# Patient Record
Sex: Female | Born: 1945 | Race: White | Hispanic: No | Smoking: Former smoker
Health system: Southern US, Community
[De-identification: ages and names within clinical notes are randomized; demographics above are authoritative.]

## PROBLEM LIST (undated history)

## (undated) ENCOUNTER — Emergency Department (HOSPITAL_COMMUNITY)

## (undated) DIAGNOSIS — M5136 Other intervertebral disc degeneration, lumbar region: Secondary | ICD-10-CM

## (undated) DIAGNOSIS — K449 Diaphragmatic hernia without obstruction or gangrene: Secondary | ICD-10-CM

## (undated) DIAGNOSIS — Z87412 Personal history of vulvar dysplasia: Secondary | ICD-10-CM

## (undated) DIAGNOSIS — K219 Gastro-esophageal reflux disease without esophagitis: Secondary | ICD-10-CM

## (undated) DIAGNOSIS — M4306 Spondylolysis, lumbar region: Secondary | ICD-10-CM

## (undated) DIAGNOSIS — Z9889 Other specified postprocedural states: Secondary | ICD-10-CM

## (undated) DIAGNOSIS — G4733 Obstructive sleep apnea (adult) (pediatric): Secondary | ICD-10-CM

## (undated) DIAGNOSIS — G4700 Insomnia, unspecified: Secondary | ICD-10-CM

## (undated) DIAGNOSIS — R112 Nausea with vomiting, unspecified: Secondary | ICD-10-CM

## (undated) DIAGNOSIS — I10 Essential (primary) hypertension: Secondary | ICD-10-CM

## (undated) DIAGNOSIS — M51369 Other intervertebral disc degeneration, lumbar region without mention of lumbar back pain or lower extremity pain: Secondary | ICD-10-CM

## (undated) DIAGNOSIS — E785 Hyperlipidemia, unspecified: Secondary | ICD-10-CM

## (undated) DIAGNOSIS — M5126 Other intervertebral disc displacement, lumbar region: Secondary | ICD-10-CM

## (undated) HISTORY — DX: Insomnia, unspecified: G47.00

## (undated) HISTORY — DX: Other intervertebral disc degeneration, lumbar region: M51.36

## (undated) HISTORY — DX: Other intervertebral disc degeneration, lumbar region without mention of lumbar back pain or lower extremity pain: M51.369

## (undated) HISTORY — DX: Spondylolysis, lumbar region: M43.06

## (undated) HISTORY — DX: Other intervertebral disc displacement, lumbar region: M51.26

## (undated) HISTORY — PX: ABDOMINAL HYSTERECTOMY: SHX81

## (undated) HISTORY — DX: Personal history of vulvar dysplasia: Z87.412

## (undated) HISTORY — DX: Obstructive sleep apnea (adult) (pediatric): G47.33

## (undated) HISTORY — DX: Hyperlipidemia, unspecified: E78.5

---
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
## 1978-01-03 HISTORY — PX: THYROID SURGERY: SHX805

## 2003-11-18 ENCOUNTER — Ambulatory Visit: Payer: Self-pay | Admitting: Internal Medicine

## 2003-12-02 ENCOUNTER — Ambulatory Visit: Payer: Self-pay | Admitting: Internal Medicine

## 2003-12-04 ENCOUNTER — Ambulatory Visit (HOSPITAL_COMMUNITY): Admission: RE | Admit: 2003-12-04 | Discharge: 2003-12-04 | Payer: Self-pay | Admitting: Internal Medicine

## 2005-03-12 ENCOUNTER — Observation Stay: Payer: Self-pay | Admitting: Internal Medicine

## 2006-01-03 HISTORY — PX: CATARACT EXTRACTION: SUR2

## 2009-08-17 ENCOUNTER — Encounter: Admission: RE | Admit: 2009-08-17 | Discharge: 2009-08-17 | Payer: Self-pay | Admitting: Family Medicine

## 2011-01-06 DIAGNOSIS — D485 Neoplasm of uncertain behavior of skin: Secondary | ICD-10-CM | POA: Diagnosis not present

## 2011-02-10 DIAGNOSIS — N390 Urinary tract infection, site not specified: Secondary | ICD-10-CM | POA: Diagnosis not present

## 2011-02-14 DIAGNOSIS — R3915 Urgency of urination: Secondary | ICD-10-CM | POA: Diagnosis not present

## 2011-02-14 DIAGNOSIS — R142 Eructation: Secondary | ICD-10-CM | POA: Diagnosis not present

## 2011-02-14 DIAGNOSIS — R141 Gas pain: Secondary | ICD-10-CM | POA: Diagnosis not present

## 2011-02-15 DIAGNOSIS — M4712 Other spondylosis with myelopathy, cervical region: Secondary | ICD-10-CM | POA: Diagnosis not present

## 2011-02-15 DIAGNOSIS — M5030 Other cervical disc degeneration, unspecified cervical region: Secondary | ICD-10-CM | POA: Diagnosis not present

## 2011-02-15 DIAGNOSIS — M542 Cervicalgia: Secondary | ICD-10-CM | POA: Diagnosis not present

## 2011-02-15 DIAGNOSIS — M9981 Other biomechanical lesions of cervical region: Secondary | ICD-10-CM | POA: Diagnosis not present

## 2011-03-10 DIAGNOSIS — G4700 Insomnia, unspecified: Secondary | ICD-10-CM | POA: Diagnosis not present

## 2011-03-10 DIAGNOSIS — Z13220 Encounter for screening for lipoid disorders: Secondary | ICD-10-CM | POA: Diagnosis not present

## 2011-03-10 DIAGNOSIS — I10 Essential (primary) hypertension: Secondary | ICD-10-CM | POA: Diagnosis not present

## 2011-03-31 DIAGNOSIS — L255 Unspecified contact dermatitis due to plants, except food: Secondary | ICD-10-CM | POA: Diagnosis not present

## 2011-04-25 DIAGNOSIS — M5030 Other cervical disc degeneration, unspecified cervical region: Secondary | ICD-10-CM | POA: Diagnosis not present

## 2011-04-25 DIAGNOSIS — M9981 Other biomechanical lesions of cervical region: Secondary | ICD-10-CM | POA: Diagnosis not present

## 2011-04-25 DIAGNOSIS — M4712 Other spondylosis with myelopathy, cervical region: Secondary | ICD-10-CM | POA: Diagnosis not present

## 2011-04-25 DIAGNOSIS — M542 Cervicalgia: Secondary | ICD-10-CM | POA: Diagnosis not present

## 2011-07-13 ENCOUNTER — Ambulatory Visit (INDEPENDENT_AMBULATORY_CARE_PROVIDER_SITE_OTHER): Payer: Medicare Other | Admitting: Emergency Medicine

## 2011-07-13 VITALS — BP 124/70 | HR 80 | Temp 97.7°F | Resp 18 | Ht 62.0 in | Wt 171.0 lb

## 2011-07-13 DIAGNOSIS — N3000 Acute cystitis without hematuria: Secondary | ICD-10-CM | POA: Diagnosis not present

## 2011-07-13 DIAGNOSIS — R350 Frequency of micturition: Secondary | ICD-10-CM

## 2011-07-13 LAB — POCT URINALYSIS DIPSTICK
Bilirubin, UA: NEGATIVE
Glucose, UA: NEGATIVE
Ketones, UA: NEGATIVE
Nitrite, UA: NEGATIVE
Protein, UA: NEGATIVE
Urobilinogen, UA: 0.2
pH, UA: 6.5

## 2011-07-13 LAB — POCT UA - MICROSCOPIC ONLY
Casts, Ur, LPF, POC: NEGATIVE
Crystals, Ur, HPF, POC: NEGATIVE
Yeast, UA: NEGATIVE

## 2011-07-13 MED ORDER — SULFAMETHOXAZOLE-TRIMETHOPRIM 800-160 MG PO TABS: 1.0000 | ORAL_TABLET | Freq: Two times a day (BID) | ORAL | Status: DC

## 2011-07-13 MED ORDER — PHENAZOPYRIDINE HCL 200 MG PO TABS: 200.0000 mg | ORAL_TABLET | Freq: Three times a day (TID) | ORAL | Status: AC | PRN

## 2011-07-13 NOTE — Patient Instructions (Addendum)

## 2011-07-13 NOTE — Progress Notes (Signed)
  Subjective:    Patient ID: Sharon Marsh, female    DOB: [birth year], 66 y.o.   MRN: [id number]  Urinary Tract Infection  This is a new problem. The current episode started yesterday. The problem occurs every urination. The problem has been unchanged. The quality of the pain is described as aching. The pain is mild. The fever has been present for 3 - 4 days. She is not sexually active. There is no history of pyelonephritis. Associated symptoms include chills, frequency and urgency. Pertinent negatives include no discharge, flank pain, hematuria, hesitancy, nausea, possible pregnancy, sweats or vomiting. She has tried nothing for the symptoms. The treatment provided no relief. There is no history of catheterization, kidney stones, recurrent UTIs, a single kidney, urinary stasis or a urological procedure.      Review of Systems  Constitutional: Positive for chills.  HENT: Negative.   Eyes: Negative.   Respiratory: Negative.   Cardiovascular: Negative.   Gastrointestinal: Negative.  Negative for nausea and vomiting.  Genitourinary: Positive for urgency and frequency. Negative for hesitancy, hematuria and flank pain.  Musculoskeletal: Negative.        Objective:   Physical Exam  Nursing note and vitals reviewed. Constitutional: She is oriented to person, place, and time. She appears well-developed and well-nourished.  HENT:  Head: Normocephalic and atraumatic.  Right Ear: External ear normal.  Left Ear: External ear normal.  Mouth/Throat: Oropharynx is clear and moist.  Eyes: Conjunctivae are normal. Pupils are equal, round, and reactive to light.  Neck: Normal range of motion.  Cardiovascular: Normal rate, regular rhythm and normal heart sounds.   Pulmonary/Chest: Effort normal and breath sounds normal.  Abdominal: Soft.  Musculoskeletal: Normal range of motion.  Neurological: She is alert and oriented to person, place, and time.  Skin: Skin is warm and dry.            Assessment & Plan:   Results for orders placed in visit on 07/13/11  POCT URINALYSIS DIPSTICK      Component Value Range   Color, UA bright yellow     Clarity, UA clear     Glucose, UA neg     Bilirubin, UA neg     Ketones, UA neg     Spec Grav, UA <=1.005     Blood, UA large     pH, UA 6.5     Protein, UA neg     Urobilinogen, UA 0.2     Nitrite, UA neg     Leukocytes, UA large (3+)    POCT UA - MICROSCOPIC ONLY      Component Value Range   WBC, Ur, HPF, POC TNTC     RBC, urine, microscopic TNTC     Bacteria, U Microscopic 1+     Mucus, UA neg     Epithelial cells, urine per micros 0-1     Crystals, Ur, HPF, POC neg     Casts, Ur, LPF, POC neg     Yeast, UA neg

## 2011-07-15 ENCOUNTER — Telehealth: Payer: Self-pay

## 2011-07-15 NOTE — Telephone Encounter (Signed)
PT STATES THAT THE MEDICATION THAT WAS PRESCRIBED FOR HER UTI IS NOT WORKING PT WOULD LIKE TO HAVE CIPRO CALLED INTO HER PHARMACY ([facility]). BEST# [phone]

## 2011-07-16 MED ORDER — CIPROFLOXACIN HCL 500 MG PO TABS: 500.0000 mg | ORAL_TABLET | Freq: Two times a day (BID) | ORAL | Status: AC

## 2011-07-16 NOTE — Telephone Encounter (Signed)
Pt.notified

## 2011-07-16 NOTE — Telephone Encounter (Signed)
Ok to switch.  D/C Septra.  Cipro sent.

## 2011-08-10 DIAGNOSIS — M4716 Other spondylosis with myelopathy, lumbar region: Secondary | ICD-10-CM | POA: Diagnosis not present

## 2011-08-10 DIAGNOSIS — M545 Low back pain: Secondary | ICD-10-CM | POA: Diagnosis not present

## 2011-08-10 DIAGNOSIS — M999 Biomechanical lesion, unspecified: Secondary | ICD-10-CM | POA: Diagnosis not present

## 2011-08-10 DIAGNOSIS — M5137 Other intervertebral disc degeneration, lumbosacral region: Secondary | ICD-10-CM | POA: Diagnosis not present

## 2011-08-15 DIAGNOSIS — M4716 Other spondylosis with myelopathy, lumbar region: Secondary | ICD-10-CM | POA: Diagnosis not present

## 2011-08-15 DIAGNOSIS — M545 Low back pain: Secondary | ICD-10-CM | POA: Diagnosis not present

## 2011-08-15 DIAGNOSIS — M5137 Other intervertebral disc degeneration, lumbosacral region: Secondary | ICD-10-CM | POA: Diagnosis not present

## 2011-08-15 DIAGNOSIS — M999 Biomechanical lesion, unspecified: Secondary | ICD-10-CM | POA: Diagnosis not present

## 2011-08-29 DIAGNOSIS — M5137 Other intervertebral disc degeneration, lumbosacral region: Secondary | ICD-10-CM | POA: Diagnosis not present

## 2011-08-29 DIAGNOSIS — M4716 Other spondylosis with myelopathy, lumbar region: Secondary | ICD-10-CM | POA: Diagnosis not present

## 2011-08-29 DIAGNOSIS — M999 Biomechanical lesion, unspecified: Secondary | ICD-10-CM | POA: Diagnosis not present

## 2011-08-29 DIAGNOSIS — M545 Low back pain: Secondary | ICD-10-CM | POA: Diagnosis not present

## 2011-08-31 DIAGNOSIS — M999 Biomechanical lesion, unspecified: Secondary | ICD-10-CM | POA: Diagnosis not present

## 2011-08-31 DIAGNOSIS — M5137 Other intervertebral disc degeneration, lumbosacral region: Secondary | ICD-10-CM | POA: Diagnosis not present

## 2011-08-31 DIAGNOSIS — M4716 Other spondylosis with myelopathy, lumbar region: Secondary | ICD-10-CM | POA: Diagnosis not present

## 2011-08-31 DIAGNOSIS — M545 Low back pain: Secondary | ICD-10-CM | POA: Diagnosis not present

## 2011-09-07 DIAGNOSIS — M545 Low back pain: Secondary | ICD-10-CM | POA: Diagnosis not present

## 2011-09-07 DIAGNOSIS — M5137 Other intervertebral disc degeneration, lumbosacral region: Secondary | ICD-10-CM | POA: Diagnosis not present

## 2011-09-07 DIAGNOSIS — M999 Biomechanical lesion, unspecified: Secondary | ICD-10-CM | POA: Diagnosis not present

## 2011-09-07 DIAGNOSIS — M4716 Other spondylosis with myelopathy, lumbar region: Secondary | ICD-10-CM | POA: Diagnosis not present

## 2011-09-14 DIAGNOSIS — M5137 Other intervertebral disc degeneration, lumbosacral region: Secondary | ICD-10-CM | POA: Diagnosis not present

## 2011-09-14 DIAGNOSIS — M545 Low back pain: Secondary | ICD-10-CM | POA: Diagnosis not present

## 2011-09-14 DIAGNOSIS — I10 Essential (primary) hypertension: Secondary | ICD-10-CM | POA: Diagnosis not present

## 2011-09-14 DIAGNOSIS — M4716 Other spondylosis with myelopathy, lumbar region: Secondary | ICD-10-CM | POA: Diagnosis not present

## 2011-09-14 DIAGNOSIS — M999 Biomechanical lesion, unspecified: Secondary | ICD-10-CM | POA: Diagnosis not present

## 2011-09-14 DIAGNOSIS — R21 Rash and other nonspecific skin eruption: Secondary | ICD-10-CM | POA: Diagnosis not present

## 2011-09-14 DIAGNOSIS — G4700 Insomnia, unspecified: Secondary | ICD-10-CM | POA: Diagnosis not present

## 2011-09-21 DIAGNOSIS — M4716 Other spondylosis with myelopathy, lumbar region: Secondary | ICD-10-CM | POA: Diagnosis not present

## 2011-09-21 DIAGNOSIS — M999 Biomechanical lesion, unspecified: Secondary | ICD-10-CM | POA: Diagnosis not present

## 2011-09-21 DIAGNOSIS — M545 Low back pain: Secondary | ICD-10-CM | POA: Diagnosis not present

## 2011-09-21 DIAGNOSIS — M5137 Other intervertebral disc degeneration, lumbosacral region: Secondary | ICD-10-CM | POA: Diagnosis not present

## 2011-09-29 DIAGNOSIS — M5137 Other intervertebral disc degeneration, lumbosacral region: Secondary | ICD-10-CM | POA: Diagnosis not present

## 2011-09-29 DIAGNOSIS — M999 Biomechanical lesion, unspecified: Secondary | ICD-10-CM | POA: Diagnosis not present

## 2011-09-29 DIAGNOSIS — M4716 Other spondylosis with myelopathy, lumbar region: Secondary | ICD-10-CM | POA: Diagnosis not present

## 2011-09-29 DIAGNOSIS — M545 Low back pain: Secondary | ICD-10-CM | POA: Diagnosis not present

## 2011-10-31 DIAGNOSIS — M4716 Other spondylosis with myelopathy, lumbar region: Secondary | ICD-10-CM | POA: Diagnosis not present

## 2011-10-31 DIAGNOSIS — M545 Low back pain: Secondary | ICD-10-CM | POA: Diagnosis not present

## 2011-10-31 DIAGNOSIS — M999 Biomechanical lesion, unspecified: Secondary | ICD-10-CM | POA: Diagnosis not present

## 2011-10-31 DIAGNOSIS — M5137 Other intervertebral disc degeneration, lumbosacral region: Secondary | ICD-10-CM | POA: Diagnosis not present

## 2011-11-07 DIAGNOSIS — M999 Biomechanical lesion, unspecified: Secondary | ICD-10-CM | POA: Diagnosis not present

## 2011-11-07 DIAGNOSIS — M4716 Other spondylosis with myelopathy, lumbar region: Secondary | ICD-10-CM | POA: Diagnosis not present

## 2011-11-07 DIAGNOSIS — M5137 Other intervertebral disc degeneration, lumbosacral region: Secondary | ICD-10-CM | POA: Diagnosis not present

## 2011-11-07 DIAGNOSIS — M545 Low back pain: Secondary | ICD-10-CM | POA: Diagnosis not present

## 2011-11-09 DIAGNOSIS — M5137 Other intervertebral disc degeneration, lumbosacral region: Secondary | ICD-10-CM | POA: Diagnosis not present

## 2011-11-09 DIAGNOSIS — M999 Biomechanical lesion, unspecified: Secondary | ICD-10-CM | POA: Diagnosis not present

## 2011-11-09 DIAGNOSIS — M4716 Other spondylosis with myelopathy, lumbar region: Secondary | ICD-10-CM | POA: Diagnosis not present

## 2011-11-09 DIAGNOSIS — M545 Low back pain: Secondary | ICD-10-CM | POA: Diagnosis not present

## 2011-11-15 DIAGNOSIS — M4716 Other spondylosis with myelopathy, lumbar region: Secondary | ICD-10-CM | POA: Diagnosis not present

## 2011-11-15 DIAGNOSIS — M545 Low back pain: Secondary | ICD-10-CM | POA: Diagnosis not present

## 2011-11-15 DIAGNOSIS — M999 Biomechanical lesion, unspecified: Secondary | ICD-10-CM | POA: Diagnosis not present

## 2011-11-15 DIAGNOSIS — M5137 Other intervertebral disc degeneration, lumbosacral region: Secondary | ICD-10-CM | POA: Diagnosis not present

## 2011-11-25 DIAGNOSIS — M5137 Other intervertebral disc degeneration, lumbosacral region: Secondary | ICD-10-CM | POA: Diagnosis not present

## 2011-11-25 DIAGNOSIS — M545 Low back pain: Secondary | ICD-10-CM | POA: Diagnosis not present

## 2011-11-25 DIAGNOSIS — M999 Biomechanical lesion, unspecified: Secondary | ICD-10-CM | POA: Diagnosis not present

## 2011-11-25 DIAGNOSIS — M4716 Other spondylosis with myelopathy, lumbar region: Secondary | ICD-10-CM | POA: Diagnosis not present

## 2011-12-08 DIAGNOSIS — M999 Biomechanical lesion, unspecified: Secondary | ICD-10-CM | POA: Diagnosis not present

## 2011-12-08 DIAGNOSIS — M545 Low back pain: Secondary | ICD-10-CM | POA: Diagnosis not present

## 2011-12-08 DIAGNOSIS — M4716 Other spondylosis with myelopathy, lumbar region: Secondary | ICD-10-CM | POA: Diagnosis not present

## 2011-12-08 DIAGNOSIS — M5137 Other intervertebral disc degeneration, lumbosacral region: Secondary | ICD-10-CM | POA: Diagnosis not present

## 2011-12-09 DIAGNOSIS — M999 Biomechanical lesion, unspecified: Secondary | ICD-10-CM | POA: Diagnosis not present

## 2011-12-09 DIAGNOSIS — M545 Low back pain: Secondary | ICD-10-CM | POA: Diagnosis not present

## 2011-12-09 DIAGNOSIS — M5137 Other intervertebral disc degeneration, lumbosacral region: Secondary | ICD-10-CM | POA: Diagnosis not present

## 2011-12-09 DIAGNOSIS — M4716 Other spondylosis with myelopathy, lumbar region: Secondary | ICD-10-CM | POA: Diagnosis not present

## 2011-12-16 DIAGNOSIS — M4716 Other spondylosis with myelopathy, lumbar region: Secondary | ICD-10-CM | POA: Diagnosis not present

## 2011-12-16 DIAGNOSIS — M5137 Other intervertebral disc degeneration, lumbosacral region: Secondary | ICD-10-CM | POA: Diagnosis not present

## 2011-12-16 DIAGNOSIS — M545 Low back pain: Secondary | ICD-10-CM | POA: Diagnosis not present

## 2011-12-16 DIAGNOSIS — M999 Biomechanical lesion, unspecified: Secondary | ICD-10-CM | POA: Diagnosis not present

## 2011-12-20 DIAGNOSIS — M999 Biomechanical lesion, unspecified: Secondary | ICD-10-CM | POA: Diagnosis not present

## 2011-12-20 DIAGNOSIS — M5137 Other intervertebral disc degeneration, lumbosacral region: Secondary | ICD-10-CM | POA: Diagnosis not present

## 2011-12-20 DIAGNOSIS — M545 Low back pain: Secondary | ICD-10-CM | POA: Diagnosis not present

## 2011-12-20 DIAGNOSIS — M4716 Other spondylosis with myelopathy, lumbar region: Secondary | ICD-10-CM | POA: Diagnosis not present

## 2011-12-23 DIAGNOSIS — L820 Inflamed seborrheic keratosis: Secondary | ICD-10-CM | POA: Diagnosis not present

## 2012-01-06 DIAGNOSIS — M5137 Other intervertebral disc degeneration, lumbosacral region: Secondary | ICD-10-CM | POA: Diagnosis not present

## 2012-01-06 DIAGNOSIS — M999 Biomechanical lesion, unspecified: Secondary | ICD-10-CM | POA: Diagnosis not present

## 2012-01-06 DIAGNOSIS — M4716 Other spondylosis with myelopathy, lumbar region: Secondary | ICD-10-CM | POA: Diagnosis not present

## 2012-01-06 DIAGNOSIS — M545 Low back pain: Secondary | ICD-10-CM | POA: Diagnosis not present

## 2012-02-03 DIAGNOSIS — M999 Biomechanical lesion, unspecified: Secondary | ICD-10-CM | POA: Diagnosis not present

## 2012-02-03 DIAGNOSIS — M5137 Other intervertebral disc degeneration, lumbosacral region: Secondary | ICD-10-CM | POA: Diagnosis not present

## 2012-02-03 DIAGNOSIS — M545 Low back pain: Secondary | ICD-10-CM | POA: Diagnosis not present

## 2012-02-03 DIAGNOSIS — M4716 Other spondylosis with myelopathy, lumbar region: Secondary | ICD-10-CM | POA: Diagnosis not present

## 2012-02-08 DIAGNOSIS — M5137 Other intervertebral disc degeneration, lumbosacral region: Secondary | ICD-10-CM | POA: Diagnosis not present

## 2012-02-08 DIAGNOSIS — M4716 Other spondylosis with myelopathy, lumbar region: Secondary | ICD-10-CM | POA: Diagnosis not present

## 2012-02-08 DIAGNOSIS — M545 Low back pain: Secondary | ICD-10-CM | POA: Diagnosis not present

## 2012-02-08 DIAGNOSIS — M999 Biomechanical lesion, unspecified: Secondary | ICD-10-CM | POA: Diagnosis not present

## 2012-02-20 DIAGNOSIS — M999 Biomechanical lesion, unspecified: Secondary | ICD-10-CM | POA: Diagnosis not present

## 2012-02-20 DIAGNOSIS — M4716 Other spondylosis with myelopathy, lumbar region: Secondary | ICD-10-CM | POA: Diagnosis not present

## 2012-02-20 DIAGNOSIS — M545 Low back pain: Secondary | ICD-10-CM | POA: Diagnosis not present

## 2012-02-20 DIAGNOSIS — M5137 Other intervertebral disc degeneration, lumbosacral region: Secondary | ICD-10-CM | POA: Diagnosis not present

## 2012-03-02 DIAGNOSIS — M545 Low back pain: Secondary | ICD-10-CM | POA: Diagnosis not present

## 2012-03-02 DIAGNOSIS — M4716 Other spondylosis with myelopathy, lumbar region: Secondary | ICD-10-CM | POA: Diagnosis not present

## 2012-03-02 DIAGNOSIS — M999 Biomechanical lesion, unspecified: Secondary | ICD-10-CM | POA: Diagnosis not present

## 2012-03-15 ENCOUNTER — Other Ambulatory Visit (HOSPITAL_COMMUNITY): Payer: Self-pay | Admitting: Family Medicine

## 2012-03-15 DIAGNOSIS — E7800 Pure hypercholesterolemia, unspecified: Secondary | ICD-10-CM | POA: Diagnosis not present

## 2012-03-15 DIAGNOSIS — N9489 Other specified conditions associated with female genital organs and menstrual cycle: Secondary | ICD-10-CM | POA: Diagnosis not present

## 2012-03-15 DIAGNOSIS — I10 Essential (primary) hypertension: Secondary | ICD-10-CM | POA: Diagnosis not present

## 2012-03-15 DIAGNOSIS — Z1231 Encounter for screening mammogram for malignant neoplasm of breast: Secondary | ICD-10-CM

## 2012-03-15 DIAGNOSIS — Z6831 Body mass index (BMI) 31.0-31.9, adult: Secondary | ICD-10-CM | POA: Diagnosis not present

## 2012-03-21 DIAGNOSIS — M545 Low back pain: Secondary | ICD-10-CM | POA: Diagnosis not present

## 2012-03-21 DIAGNOSIS — M5137 Other intervertebral disc degeneration, lumbosacral region: Secondary | ICD-10-CM | POA: Diagnosis not present

## 2012-03-21 DIAGNOSIS — M4716 Other spondylosis with myelopathy, lumbar region: Secondary | ICD-10-CM | POA: Diagnosis not present

## 2012-03-21 DIAGNOSIS — M999 Biomechanical lesion, unspecified: Secondary | ICD-10-CM | POA: Diagnosis not present

## 2012-03-22 ENCOUNTER — Ambulatory Visit (HOSPITAL_COMMUNITY): Payer: Medicare Other

## 2012-03-26 ENCOUNTER — Ambulatory Visit (HOSPITAL_COMMUNITY)
Admission: RE | Admit: 2012-03-26 | Discharge: 2012-03-26 | Disposition: A | Discharge status: Home / Self Care | Payer: Medicare Other | Source: Ambulatory Visit | Attending: Family Medicine | Admitting: Family Medicine

## 2012-03-26 DIAGNOSIS — Z1231 Encounter for screening mammogram for malignant neoplasm of breast: Secondary | ICD-10-CM | POA: Insufficient documentation

## 2012-03-29 DIAGNOSIS — M5137 Other intervertebral disc degeneration, lumbosacral region: Secondary | ICD-10-CM | POA: Diagnosis not present

## 2012-03-29 DIAGNOSIS — M4716 Other spondylosis with myelopathy, lumbar region: Secondary | ICD-10-CM | POA: Diagnosis not present

## 2012-03-29 DIAGNOSIS — M999 Biomechanical lesion, unspecified: Secondary | ICD-10-CM | POA: Diagnosis not present

## 2012-03-29 DIAGNOSIS — M545 Low back pain: Secondary | ICD-10-CM | POA: Diagnosis not present

## 2012-04-02 DIAGNOSIS — M999 Biomechanical lesion, unspecified: Secondary | ICD-10-CM | POA: Diagnosis not present

## 2012-04-02 DIAGNOSIS — M5137 Other intervertebral disc degeneration, lumbosacral region: Secondary | ICD-10-CM | POA: Diagnosis not present

## 2012-04-02 DIAGNOSIS — M545 Low back pain: Secondary | ICD-10-CM | POA: Diagnosis not present

## 2012-04-02 DIAGNOSIS — M4716 Other spondylosis with myelopathy, lumbar region: Secondary | ICD-10-CM | POA: Diagnosis not present

## 2012-04-17 DIAGNOSIS — M4716 Other spondylosis with myelopathy, lumbar region: Secondary | ICD-10-CM | POA: Diagnosis not present

## 2012-04-17 DIAGNOSIS — Z01419 Encounter for gynecological examination (general) (routine) without abnormal findings: Secondary | ICD-10-CM | POA: Diagnosis not present

## 2012-04-17 DIAGNOSIS — M545 Low back pain: Secondary | ICD-10-CM | POA: Diagnosis not present

## 2012-04-17 DIAGNOSIS — M999 Biomechanical lesion, unspecified: Secondary | ICD-10-CM | POA: Diagnosis not present

## 2012-04-17 DIAGNOSIS — M5137 Other intervertebral disc degeneration, lumbosacral region: Secondary | ICD-10-CM | POA: Diagnosis not present

## 2012-04-17 DIAGNOSIS — Z124 Encounter for screening for malignant neoplasm of cervix: Secondary | ICD-10-CM | POA: Diagnosis not present

## 2012-05-04 DIAGNOSIS — M4716 Other spondylosis with myelopathy, lumbar region: Secondary | ICD-10-CM | POA: Diagnosis not present

## 2012-05-04 DIAGNOSIS — M5137 Other intervertebral disc degeneration, lumbosacral region: Secondary | ICD-10-CM | POA: Diagnosis not present

## 2012-05-04 DIAGNOSIS — M545 Low back pain: Secondary | ICD-10-CM | POA: Diagnosis not present

## 2012-05-04 DIAGNOSIS — M999 Biomechanical lesion, unspecified: Secondary | ICD-10-CM | POA: Diagnosis not present

## 2012-05-09 DIAGNOSIS — D4959 Neoplasm of unspecified behavior of other genitourinary organ: Secondary | ICD-10-CM | POA: Diagnosis not present

## 2012-05-09 DIAGNOSIS — N901 Moderate vulvar dysplasia: Secondary | ICD-10-CM | POA: Diagnosis not present

## 2012-05-09 DIAGNOSIS — Z113 Encounter for screening for infections with a predominantly sexual mode of transmission: Secondary | ICD-10-CM | POA: Diagnosis not present

## 2012-05-17 DIAGNOSIS — M999 Biomechanical lesion, unspecified: Secondary | ICD-10-CM | POA: Diagnosis not present

## 2012-05-17 DIAGNOSIS — M5137 Other intervertebral disc degeneration, lumbosacral region: Secondary | ICD-10-CM | POA: Diagnosis not present

## 2012-05-17 DIAGNOSIS — M545 Low back pain: Secondary | ICD-10-CM | POA: Diagnosis not present

## 2012-05-17 DIAGNOSIS — M4716 Other spondylosis with myelopathy, lumbar region: Secondary | ICD-10-CM | POA: Diagnosis not present

## 2012-05-30 DIAGNOSIS — D071 Carcinoma in situ of vulva: Secondary | ICD-10-CM | POA: Diagnosis not present

## 2012-06-04 DIAGNOSIS — N9089 Other specified noninflammatory disorders of vulva and perineum: Secondary | ICD-10-CM | POA: Diagnosis not present

## 2012-06-05 DIAGNOSIS — M5137 Other intervertebral disc degeneration, lumbosacral region: Secondary | ICD-10-CM | POA: Diagnosis not present

## 2012-06-05 DIAGNOSIS — M545 Low back pain: Secondary | ICD-10-CM | POA: Diagnosis not present

## 2012-06-05 DIAGNOSIS — M999 Biomechanical lesion, unspecified: Secondary | ICD-10-CM | POA: Diagnosis not present

## 2012-06-05 DIAGNOSIS — M4716 Other spondylosis with myelopathy, lumbar region: Secondary | ICD-10-CM | POA: Diagnosis not present

## 2012-06-08 DIAGNOSIS — M545 Low back pain: Secondary | ICD-10-CM | POA: Diagnosis not present

## 2012-06-08 DIAGNOSIS — M999 Biomechanical lesion, unspecified: Secondary | ICD-10-CM | POA: Diagnosis not present

## 2012-06-08 DIAGNOSIS — M5137 Other intervertebral disc degeneration, lumbosacral region: Secondary | ICD-10-CM | POA: Diagnosis not present

## 2012-06-08 DIAGNOSIS — M4716 Other spondylosis with myelopathy, lumbar region: Secondary | ICD-10-CM | POA: Diagnosis not present

## 2012-06-18 ENCOUNTER — Encounter (HOSPITAL_COMMUNITY): Payer: Self-pay | Admitting: Pharmacist

## 2012-06-20 DIAGNOSIS — M545 Low back pain: Secondary | ICD-10-CM | POA: Diagnosis not present

## 2012-06-20 DIAGNOSIS — M4716 Other spondylosis with myelopathy, lumbar region: Secondary | ICD-10-CM | POA: Diagnosis not present

## 2012-06-20 DIAGNOSIS — M999 Biomechanical lesion, unspecified: Secondary | ICD-10-CM | POA: Diagnosis not present

## 2012-06-20 DIAGNOSIS — M5137 Other intervertebral disc degeneration, lumbosacral region: Secondary | ICD-10-CM | POA: Diagnosis not present

## 2012-06-21 ENCOUNTER — Other Ambulatory Visit: Payer: Self-pay

## 2012-06-21 ENCOUNTER — Encounter (HOSPITAL_COMMUNITY)
Admission: RE | Admit: 2012-06-21 | Discharge: 2012-06-21 | Disposition: A | Discharge status: Home / Self Care | Payer: Medicare Other | Source: Ambulatory Visit | Attending: Obstetrics and Gynecology | Admitting: Obstetrics and Gynecology

## 2012-06-21 ENCOUNTER — Encounter (HOSPITAL_COMMUNITY): Payer: Self-pay

## 2012-06-21 ENCOUNTER — Other Ambulatory Visit: Payer: Self-pay | Admitting: Obstetrics and Gynecology

## 2012-06-21 DIAGNOSIS — D071 Carcinoma in situ of vulva: Secondary | ICD-10-CM | POA: Diagnosis not present

## 2012-06-21 DIAGNOSIS — G4730 Sleep apnea, unspecified: Secondary | ICD-10-CM | POA: Diagnosis not present

## 2012-06-21 DIAGNOSIS — I10 Essential (primary) hypertension: Secondary | ICD-10-CM | POA: Diagnosis not present

## 2012-06-21 HISTORY — DX: Other specified postprocedural states: Z98.890

## 2012-06-21 HISTORY — DX: Gastro-esophageal reflux disease without esophagitis: K21.9

## 2012-06-21 HISTORY — DX: Essential (primary) hypertension: I10

## 2012-06-21 HISTORY — DX: Nausea with vomiting, unspecified: R11.2

## 2012-06-21 LAB — CBC
HCT: 40.4 % (ref 36.0–46.0)
Hemoglobin: 13.6 g/dL (ref 12.0–15.0)
MCH: 28 pg (ref 26.0–34.0)
MCHC: 33.7 g/dL (ref 30.0–36.0)
MCV: 83.3 fL (ref 78.0–100.0)
Platelets: 266 10*3/uL (ref 150–400)
RBC: 4.85 MIL/uL (ref 3.87–5.11)
RDW: 13.9 % (ref 11.5–15.5)
WBC: 5.6 10*3/uL (ref 4.0–10.5)

## 2012-06-21 LAB — BASIC METABOLIC PANEL
BUN: 15 mg/dL (ref 6–23)
CO2: 28 mEq/L (ref 19–32)
Calcium: 9.5 mg/dL (ref 8.4–10.5)
Chloride: 100 mEq/L (ref 96–112)
Creatinine, Ser: 0.76 mg/dL (ref 0.50–1.10)
GFR calc Af Amer: >90 mL/min (ref >90)
GFR calc non Af Amer: 85 mL/min — ABNORMAL LOW (ref >90)
Glucose, Bld: 92 mg/dL (ref 70–99)
Potassium: 4.2 mEq/L (ref 3.5–5.1)
Sodium: 138 mEq/L (ref 135–145)

## 2012-06-21 NOTE — Patient Instructions (Addendum)
20 AV[address]AU[address]  06/21/2012   Your procedure is scheduled on:  06/22/12  Enter through the Main Entrance of [facility] at 6 AM.  Pick up the phone at the desk and dial [phone].   Call this number if you have problems the morning of surgery: [phone]   Remember:   Do not eat food:After Midnight.  Do not drink clear liquids: After Midnight.  Take these medicines the morning of surgery with A SIP OF WATER: Blood pressure, Prilosec   Do not wear jewelry, make-up or nail polish.  Do not wear lotions, powders, or perfumes. You may wear deodorant.  Do not shave 48 hours prior to surgery.  Do not bring valuables to the hospital.  [facility] is not responsible                  for any belongings or valuables brought to the hospital.  Contacts, dentures or bridgework may not be worn into surgery.  Leave suitcase in the car. After surgery it may be brought to your room.  For patients admitted to the hospital, checkout time is 11:00 AM the day of                discharge.   Patients discharged the day of surgery will not be allowed to drive                   home.  Name and phone number of your driver: daughter  Special Instructions: Shower using CHG 2 nights before surgery and the night before surgery.  If you shower the day of surgery use CHG.  Use special wash - you have one bottle of CHG for all showers.  You should use approximately 1/3 of the bottle for each shower.   Please read over the following fact sheets that you were given: Surgical Site Infection Prevention

## 2012-06-21 NOTE — H&P (Addendum)
67 y.o. complains of irritated area at posterior R vulva for several months.  On exam the area appeared to be VIN vs. Vulvar cancer.  Biopsy confirmed high grade VIN. Colpo performed to visualize extent of affected area.  Pt was scheduled for wide local excision of the affected area.  Normal pap 2010, repap of vaginal cuff 04/23/2012 neg and HPV neg.  Past Medical History  Diagnosis Date  . PONV (postoperative nausea and vomiting)   . Hypertension   . GERD (gastroesophageal reflux disease)    Past Surgical History  Procedure Laterality Date  . Appendectomy    . Abdominal hysterectomy      History   Social History  . Marital Status: Widowed    Spouse Name: N/A    Number of Children: N/A  . Years of Education: N/A   Occupational History  . Not on file.   Social History Main Topics  . Smoking status: Former Games developer  . Smokeless tobacco: Not on file  . Alcohol Use: Yes     Comment: socially  . Drug Use: No  . Sexually Active: Not on file   Other Topics Concern  . Not on file   Social History Narrative  . No narrative on file    No current facility-administered medications on file prior to encounter.   Current Outpatient Prescriptions on File Prior to Encounter  Medication Sig Dispense Refill  . omeprazole (PRILOSEC) 20 MG capsule Take 20 mg by mouth daily.        Allergies  Allergen Reactions  . Clindamycin/Lincomycin Hypertension    @VITALS2 @  Lungs: clear to ascultation Cor:  RRR Abdomen:  soft, nontender, nondistended. Ex:  no cords, erythema Pelvic:  Def to OR  A:  High grade VIN   P:  Wide local excision.   All risks, benefits and alternatives d/w patient and she desires to proceed with procedure .      Nephi Savage  No changes to history of physical.  Will proceed as planned. Consent reaffirmed, alternatives discussed, risks, benefits, potential for recurrence and increased frequency of monitoring reviewed.

## 2012-06-22 ENCOUNTER — Encounter (HOSPITAL_COMMUNITY): Payer: Self-pay | Admitting: Anesthesiology

## 2012-06-22 ENCOUNTER — Encounter (HOSPITAL_COMMUNITY)
Admission: RE | Disposition: A | Discharge status: Home / Self Care | Payer: Self-pay | Source: Ambulatory Visit | Attending: Obstetrics and Gynecology

## 2012-06-22 ENCOUNTER — Ambulatory Visit (HOSPITAL_COMMUNITY): Payer: Medicare Other | Admitting: Anesthesiology

## 2012-06-22 ENCOUNTER — Ambulatory Visit (HOSPITAL_COMMUNITY)
Admission: RE | Admit: 2012-06-22 | Discharge: 2012-06-22 | Disposition: A | Discharge status: Home / Self Care | Payer: Medicare Other | Source: Ambulatory Visit | Attending: Obstetrics and Gynecology | Admitting: Obstetrics and Gynecology

## 2012-06-22 DIAGNOSIS — N901 Moderate vulvar dysplasia: Secondary | ICD-10-CM | POA: Diagnosis not present

## 2012-06-22 DIAGNOSIS — G4730 Sleep apnea, unspecified: Secondary | ICD-10-CM | POA: Insufficient documentation

## 2012-06-22 DIAGNOSIS — I10 Essential (primary) hypertension: Secondary | ICD-10-CM | POA: Insufficient documentation

## 2012-06-22 DIAGNOSIS — D071 Carcinoma in situ of vulva: Secondary | ICD-10-CM | POA: Diagnosis not present

## 2012-06-22 HISTORY — PX: VULVECTOMY: SHX1086

## 2012-06-22 HISTORY — PX: OTHER SURGICAL HISTORY: SHX169

## 2012-06-22 SURGERY — WIDE EXCISION VULVECTOMY
Anesthesia: General | Site: Vagina | Wound class: Clean Contaminated

## 2012-06-22 MED ORDER — ACETAMINOPHEN 160 MG/5ML PO SOLN
ORAL | Status: AC
  Administered 2012-06-22: 975 mg via ORAL
  Filled 2012-06-22: qty 40.6

## 2012-06-22 MED ORDER — CHLOROPROCAINE HCL 1 % IJ SOLN
INTRAMUSCULAR | Status: AC
  Filled 2012-06-22: qty 30

## 2012-06-22 MED ORDER — DEXAMETHASONE SODIUM PHOSPHATE 10 MG/ML IJ SOLN
INTRAMUSCULAR | Status: AC
Start: 2012-06-22 — End: 2012-06-22
  Filled 2012-06-22: qty 1

## 2012-06-22 MED ORDER — FENTANYL CITRATE 0.05 MG/ML IJ SOLN
INTRAMUSCULAR | Status: DC | PRN
  Administered 2012-06-22: 100 ug via INTRAVENOUS

## 2012-06-22 MED ORDER — ONDANSETRON HCL 4 MG/2ML IJ SOLN
INTRAMUSCULAR | Status: DC | PRN
  Administered 2012-06-22: 4 mg via INTRAVENOUS

## 2012-06-22 MED ORDER — KETOROLAC TROMETHAMINE 30 MG/ML IJ SOLN
INTRAMUSCULAR | Status: AC
  Filled 2012-06-22: qty 1

## 2012-06-22 MED ORDER — EPHEDRINE SULFATE 50 MG/ML IJ SOLN
INTRAMUSCULAR | Status: DC | PRN
  Administered 2012-06-22 (×4): 10 mg via INTRAVENOUS

## 2012-06-22 MED ORDER — FENTANYL CITRATE 0.05 MG/ML IJ SOLN: 25.0000 ug | INTRAMUSCULAR | Status: DC | PRN

## 2012-06-22 MED ORDER — KETOROLAC TROMETHAMINE 30 MG/ML IJ SOLN
INTRAMUSCULAR | Status: DC | PRN
  Administered 2012-06-22: 30 mg via INTRAVENOUS

## 2012-06-22 MED ORDER — ACETAMINOPHEN 160 MG/5ML PO SOLN
975.0000 mg | Freq: Once | ORAL | Status: AC
  Administered 2012-06-22: 975 mg via ORAL

## 2012-06-22 MED ORDER — LACTATED RINGERS IV SOLN
INTRAVENOUS | Status: DC
  Administered 2012-06-22 (×2): via INTRAVENOUS

## 2012-06-22 MED ORDER — PROPOFOL 10 MG/ML IV EMUL
INTRAVENOUS | Status: AC
  Filled 2012-06-22: qty 20

## 2012-06-22 MED ORDER — EPHEDRINE 5 MG/ML INJ
INTRAVENOUS | Status: AC
Start: 2012-06-22 — End: 2012-06-22
  Filled 2012-06-22: qty 10

## 2012-06-22 MED ORDER — ONDANSETRON HCL 4 MG/2ML IJ SOLN
INTRAMUSCULAR | Status: AC
  Filled 2012-06-22: qty 2

## 2012-06-22 MED ORDER — FENTANYL CITRATE 0.05 MG/ML IJ SOLN
INTRAMUSCULAR | Status: AC
  Filled 2012-06-22: qty 5

## 2012-06-22 MED ORDER — ONDANSETRON HCL 4 MG/2ML IJ SOLN: INTRAMUSCULAR | Status: DC | PRN

## 2012-06-22 MED ORDER — LIDOCAINE HCL (CARDIAC) 20 MG/ML IV SOLN
INTRAVENOUS | Status: AC
Start: 2012-06-22 — End: 2012-06-22
  Filled 2012-06-22: qty 5

## 2012-06-22 MED ORDER — PROPOFOL 10 MG/ML IV BOLUS
INTRAVENOUS | Status: DC | PRN
  Administered 2012-06-22: 180 mg via INTRAVENOUS
  Administered 2012-06-22: 20 mg via INTRAVENOUS

## 2012-06-22 MED ORDER — ACETIC ACID 4% SOLUTION
Status: DC | PRN
  Administered 2012-06-22: 1 via TOPICAL

## 2012-06-22 MED ORDER — SCOPOLAMINE 1 MG/3DAYS TD PT72
1.0000 | MEDICATED_PATCH | Freq: Once | TRANSDERMAL | Status: DC | PRN
  Administered 2012-06-22: 1.5 mg via TRANSDERMAL

## 2012-06-22 MED ORDER — LIDOCAINE HCL (CARDIAC) 20 MG/ML IV SOLN
INTRAVENOUS | Status: DC | PRN
  Administered 2012-06-22: 50 mg via INTRAVENOUS

## 2012-06-22 MED ORDER — FENTANYL CITRATE 0.05 MG/ML IJ SOLN
INTRAMUSCULAR | Status: AC
  Administered 2012-06-22: 50 ug via INTRAVENOUS
  Filled 2012-06-22: qty 2

## 2012-06-22 MED ORDER — OXYCODONE-ACETAMINOPHEN 5-325 MG PO TABS: 1.0000 | ORAL_TABLET | ORAL | Status: DC | PRN

## 2012-06-22 MED ORDER — DEXAMETHASONE SODIUM PHOSPHATE 10 MG/ML IJ SOLN
INTRAMUSCULAR | Status: DC | PRN
  Administered 2012-06-22: 8 mg via INTRAVENOUS

## 2012-06-22 MED ORDER — VASOPRESSIN 20 UNIT/ML IJ SOLN
INTRAMUSCULAR | Status: AC
  Filled 2012-06-22: qty 1

## 2012-06-22 MED ORDER — LIDOCAINE HCL 1 % IJ SOLN
INTRAMUSCULAR | Status: DC | PRN
  Administered 2012-06-22: 6 mL

## 2012-06-22 SURGICAL SUPPLY — 47 items
APPLICATOR COTTON TIP 6IN STRL (MISCELLANEOUS) ×3 IMPLANT
BLADE SURG 11 STRL SS (BLADE) ×2 IMPLANT
BLADE SURG 15 STRL LF C SS BP (BLADE) ×2 IMPLANT
BLADE SURG 15 STRL SS (BLADE) ×3
CATH SILICONE 16FRX5CC (CATHETERS) ×4 IMPLANT
CLOTH BEACON ORANGE TIMEOUT ST (SAFETY) ×3 IMPLANT
CONTAINER PREFILL 10% NBF 15ML (MISCELLANEOUS) IMPLANT
CONTAINER PREFILL 10% NBF 60ML (FORM) IMPLANT
COUNTER NEEDLE 1200 MAGNETIC (NEEDLE) IMPLANT
DECANTER SPIKE VIAL GLASS SM (MISCELLANEOUS) ×2 IMPLANT
DEPRESSOR TONGUE BLADE WOOD (MISCELLANEOUS) ×3 IMPLANT
DRESSING TELFA 8X3 (GAUZE/BANDAGES/DRESSINGS) ×3 IMPLANT
ELECT NDL TIP 2.8 STRL (NEEDLE) IMPLANT
ELECT NEEDLE TIP 2.8 STRL (NEEDLE) ×3 IMPLANT
ELECT REM PT RETURN 9FT ADLT (ELECTROSURGICAL)
ELECTRODE REM PT RTRN 9FT ADLT (ELECTROSURGICAL) IMPLANT
EVACUATOR PREFILTER SMOKE (MISCELLANEOUS) ×3 IMPLANT
GAUZE SPONGE 4X4 16PLY XRAY LF (GAUZE/BANDAGES/DRESSINGS) IMPLANT
GLOVE BIO SURGEON STRL SZ 6.5 (GLOVE) ×3 IMPLANT
GLOVE BIOGEL PI IND STRL 6.5 (GLOVE) ×4 IMPLANT
GLOVE BIOGEL PI INDICATOR 6.5 (GLOVE) ×2
GOWN PREVENTION PLUS LG XLONG (DISPOSABLE) ×6 IMPLANT
GOWN STRL REIN XL XLG (GOWN DISPOSABLE) ×6 IMPLANT
HOSE NS SMOKE EVAC 7/8 X6 (MISCELLANEOUS) ×3 IMPLANT
NDL SPNL 22GX3.5 QUINCKE BK (NEEDLE) ×1 IMPLANT
NEEDLE HYPO 25X1 1.5 SAFETY (NEEDLE) ×3 IMPLANT
NEEDLE SPNL 22GX3.5 QUINCKE BK (NEEDLE) ×3 IMPLANT
NS IRRIG 1000ML POUR BTL (IV SOLUTION) IMPLANT
PACK VAGINAL MINOR WOMEN LF (CUSTOM PROCEDURE TRAY) ×3 IMPLANT
PAD OB MATERNITY 4.3X12.25 (PERSONAL CARE ITEMS) ×3 IMPLANT
PAD PREP 24X48 CUFFED NSTRL (MISCELLANEOUS) ×3 IMPLANT
PENCIL BUTTON HOLSTER BLD 10FT (ELECTRODE) ×2 IMPLANT
REDUCER FITTING SMOKE EVAC (MISCELLANEOUS) ×3 IMPLANT
SCOPETTES 8  STERILE (MISCELLANEOUS) ×2
SCOPETTES 8 STERILE (MISCELLANEOUS) ×4 IMPLANT
SUT VIC AB 0 CT1 27 (SUTURE)
SUT VIC AB 0 CT1 27XBRD ANBCTR (SUTURE) IMPLANT
SUT VIC AB 2-0 SH 27 (SUTURE)
SUT VIC AB 2-0 SH 27XBRD (SUTURE) IMPLANT
SUT VIC AB 3-0 SH 27 (SUTURE) ×9
SUT VIC AB 3-0 SH 27X BRD (SUTURE) ×6 IMPLANT
SYR CONTROL 10ML LL (SYRINGE) ×3 IMPLANT
TOWEL OR 17X24 6PK STRL BLUE (TOWEL DISPOSABLE) ×6 IMPLANT
TUBING NON-CON 1/4 X 20 CONN (TUBING) ×2 IMPLANT
TUBING SMOKE EVAC HOSE ADAPTER (MISCELLANEOUS) ×3 IMPLANT
WATER STERILE IRR 1000ML POUR (IV SOLUTION) ×4 IMPLANT
YANKAUER SUCT BULB TIP NO VENT (SUCTIONS) ×2 IMPLANT

## 2012-06-22 NOTE — Anesthesia Preprocedure Evaluation (Signed)
Anesthesia Evaluation  Patient identified by MRN, date of birth, ID band Patient awake    Reviewed: Allergy & Precautions, H&P , NPO status , Patient's Chart, lab work & pertinent test results, reviewed documented beta blocker date and time   History of Anesthesia Complications (+) PONV  Airway Mallampati: III TM Distance: >3 FB Neck ROM: full    Dental  (+) Teeth Intact Permanent bridge - front top teeth:   Pulmonary former smoker (quit more than 10 years ago),  Reports wakes up gasping (feels may be from drainage when sleeping lying on back). Family member reports she does snore.  Has not had a sleep study breath sounds clear to auscultation  Pulmonary exam normal       Cardiovascular Exercise Tolerance: Good hypertension (160/83, took metoprolol today), On Home Beta Blockers Rhythm:regular Rate:Normal     Neuro/Psych negative neurological ROS  negative psych ROS   GI/Hepatic Neg liver ROS, GERD-  Medicated,  Endo/Other  negative endocrine ROS  Renal/GU negative Renal ROS  Female GU complaint     Musculoskeletal   Abdominal   Peds  Hematology negative hematology ROS (+)   Anesthesia Other Findings   Reproductive/Obstetrics negative OB ROS                           Anesthesia Physical Anesthesia Plan  ASA: II  Anesthesia Plan: General LMA   Post-op Pain Management:    Induction:   Airway Management Planned:   Additional Equipment:   Intra-op Plan:   Post-operative Plan:   Informed Consent: I have reviewed the patients History and Physical, chart, labs and discussed the procedure including the risks, benefits and alternatives for the proposed anesthesia with the patient or authorized representative who has indicated his/her understanding and acceptance.   Dental Advisory Given  Plan Discussed with: CRNA and Surgeon  Anesthesia Plan Comments:         Anesthesia  Quick Evaluation

## 2012-06-22 NOTE — Discharge Summary (Signed)
  Pt admitted for same day surgery, underwent wide local excision for high grade VIN.  Surgery was uncomplicated.  Pt showed signs of undiagnosed sleep apnea per anesthesia, but recovered well in PACU and was discharged with Rx for percocet.

## 2012-06-22 NOTE — Anesthesia Postprocedure Evaluation (Signed)
  Anesthesia Post-op Note  Patient: Sharon Marsh  Procedure(s) Performed: Procedure(s) with comments: WIDE EXCISION VULVECTOMY (N/A) - wide local excision of vulva  Patient Location: PACU  Anesthesia Type:General  Level of Consciousness: awake, alert  and oriented  Airway and Oxygen Therapy: Patient Spontanous Breathing  Post-op Pain: none  Post-op Assessment: Post-op Vital signs reviewed, Patient's Cardiovascular Status Stable, Respiratory Function Stable, Patent Airway, No signs of Nausea or vomiting, Adequate PO intake and Pain level controlled  Post-op Vital Signs: Reviewed and stable  Complications: No apparent anesthesia complications

## 2012-06-22 NOTE — Transfer of Care (Signed)
Immediate Anesthesia Transfer of Care Note  Patient: Sharon Marsh  Procedure(s) Performed: Procedure(s) with comments: WIDE EXCISION VULVECTOMY (N/A) - wide local excision of vulva  Patient Location: PACU  Anesthesia Type:General  Level of Consciousness: awake  Airway & Oxygen Therapy: Patient Spontanous Breathing  Post-op Assessment: Report given to PACU RN  Post vital signs: stable  Filed Vitals:   06/22/12 0606  BP: 160/83  Pulse: 75  Temp: 36.5 C  Resp: 18    Complications: No apparent anesthesia complications

## 2012-06-22 NOTE — Brief Op Note (Signed)
06/22/2012  8:27 AM  PATIENT:  Sharon Marsh  67 y.o. female  PRE-OPERATIVE DIAGNOSIS:  High grade VIN  POST-OPERATIVE DIAGNOSIS:  High grade VIN  PROCEDURE:  Procedure(s) with comments: WIDE EXCISION VULVECTOMY (N/A) - wide local excision of vulva  SURGEON:  Surgeon(s) and Role:    Philip Aspen, DO - Primary   ANESTHESIA:   local and MAC  EBL:  Total I/O In: 1800 [I.V.:1800] Out: 310 [Urine:300; Blood:10]  BLOOD ADMINISTERED:none  DRAINS: none   LOCAL MEDICATIONS USED:  LIDOCAINE   SPECIMEN:  Source of Specimen:  right posterior vulva  DISPOSITION OF SPECIMEN:  PATHOLOGY  COUNTS:  YES  PLAN OF CARE: Discharge to home after PACU  PATIENT DISPOSITION:  PACU - hemodynamically stable.

## 2012-06-25 ENCOUNTER — Encounter (HOSPITAL_COMMUNITY): Payer: Self-pay | Admitting: Obstetrics and Gynecology

## 2012-06-27 DIAGNOSIS — M5137 Other intervertebral disc degeneration, lumbosacral region: Secondary | ICD-10-CM | POA: Diagnosis not present

## 2012-06-27 DIAGNOSIS — M999 Biomechanical lesion, unspecified: Secondary | ICD-10-CM | POA: Diagnosis not present

## 2012-06-27 DIAGNOSIS — M545 Low back pain: Secondary | ICD-10-CM | POA: Diagnosis not present

## 2012-06-27 DIAGNOSIS — M4716 Other spondylosis with myelopathy, lumbar region: Secondary | ICD-10-CM | POA: Diagnosis not present

## 2012-06-28 ENCOUNTER — Inpatient Hospital Stay (HOSPITAL_COMMUNITY)
Admission: AD | Admit: 2012-06-28 | Discharge: 2012-06-28 | Disposition: A | Discharge status: Home / Self Care | Payer: Medicare Other | Source: Ambulatory Visit | Attending: Obstetrics and Gynecology | Admitting: Obstetrics and Gynecology

## 2012-06-28 ENCOUNTER — Encounter (HOSPITAL_COMMUNITY): Payer: Self-pay | Admitting: *Deleted

## 2012-06-28 DIAGNOSIS — T8189XA Other complications of procedures, not elsewhere classified, initial encounter: Secondary | ICD-10-CM

## 2012-06-28 DIAGNOSIS — N949 Unspecified condition associated with female genital organs and menstrual cycle: Secondary | ICD-10-CM | POA: Diagnosis not present

## 2012-06-28 DIAGNOSIS — Y838 Other surgical procedures as the cause of abnormal reaction of the patient, or of later complication, without mention of misadventure at the time of the procedure: Secondary | ICD-10-CM | POA: Insufficient documentation

## 2012-06-28 HISTORY — DX: Diaphragmatic hernia without obstruction or gangrene: K44.9

## 2012-06-28 LAB — URINALYSIS, ROUTINE W REFLEX MICROSCOPIC
Glucose, UA: NEGATIVE mg/dL
Specific Gravity, Urine: 1.02 (ref 1.005–1.030)
pH: 6 (ref 5.0–8.0)

## 2012-06-28 LAB — URINE MICROSCOPIC-ADD ON

## 2012-06-28 LAB — CBC
HCT: 39.9 % (ref 36.0–46.0)
Hemoglobin: 13.3 g/dL (ref 12.0–15.0)
MCV: 83 fL (ref 78.0–100.0)
Platelets: 269 10*3/uL (ref 150–400)
RBC: 4.81 MIL/uL (ref 3.87–5.11)
WBC: 7.7 10*3/uL (ref 4.0–10.5)

## 2012-06-28 LAB — WET PREP, GENITAL: Clue Cells Wet Prep HPF POC: NONE SEEN

## 2012-06-28 NOTE — MAU Provider Note (Signed)
History     CSN: [id number]  Arrival date and time: 06/28/12 2000   First Provider Initiated Contact with Patient 06/28/12 2113      Chief Complaint  Patient presents with  . Vaginal Discharge  . Post-op Problem   HPI  Sharon Marsh is a 67 y.o. who had surgery for precancerous cells on the vulva on 06/22/12, and now she has a vaginal discharge with an odor as well as burning. She denies any fever or increasing pain. She has tried sitz baths, ice to area, and nothing seems to be helping.   Past Medical History  Diagnosis Date  . PONV (postoperative nausea and vomiting)   . Hypertension   . GERD (gastroesophageal reflux disease)   . Hiatal hernia     Past Surgical History  Procedure Laterality Date  . Appendectomy    . Abdominal hysterectomy    . Vulvectomy N/A 06/22/2012    Procedure: WIDE EXCISION VULVECTOMY;  Surgeon: Philip Aspen, DO;  Location: [facility];  Service: Gynecology;  Laterality: N/A;  wide local excision of vulva  . Thyroid surgery      History reviewed. No pertinent family history.  History  Substance Use Topics  . Smoking status: Former Games developer  . Smokeless tobacco: Not on file  . Alcohol Use: Yes     Comment: socially    Allergies:  Allergies  Allergen Reactions  . Clindamycin/Lincomycin Hypertension  . Latex Other (See Comments)    Patient states that certain latex bandades causes irritation to her skin    Prescriptions prior to admission  Medication Sig Dispense Refill  . b complex vitamins tablet Take 1 tablet by mouth daily.      . diphenhydrAMINE (BENADRYL) 25 MG tablet Take 25 mg by mouth every 6 (six) hours as needed for allergies.      . Flaxseed, Linseed, (FLAX SEED OIL PO) Take 1 capsule by mouth daily.      Marland Kitchen ibuprofen (ADVIL,MOTRIN) 200 MG tablet Take 800 mg by mouth 2 (two) times daily as needed for pain.      . metoprolol (LOPRESSOR) 50 MG tablet Take 25 mg by mouth 2 (two) times daily.      Marland Kitchen omeprazole (PRILOSEC) 20 MG  capsule Take 20 mg by mouth daily.        ROS Physical Exam   Blood pressure 137/98, pulse 127, temperature 98.6 F (37 C), temperature source Oral, resp. rate 18, height 5\' 1"  (1.549 m), weight 79.379 kg (175 lb), SpO2 99.00%.  Physical Exam  Nursing note and vitals reviewed. Constitutional: She is oriented to person, place, and time. She appears well-developed. No distress.  Cardiovascular: Normal rate.   Respiratory: Effort normal.  Genitourinary:   External: suture is visible, and there is one 2cmx1cm patch of granulation tissue that appears white/green. Tender to palpation   Neurological: She is alert and oriented to person, place, and time.  Skin: Skin is warm and dry.  Psychiatric: She has a normal mood and affect.    MAU Course  Procedures  Results for orders placed during the hospital encounter of 06/28/12 (from the past 24 hour(s))  URINALYSIS, ROUTINE W REFLEX MICROSCOPIC     Status: Abnormal   Collection Time    06/28/12  8:12 PM      Result Value Range   Color, Urine YELLOW  YELLOW   APPearance CLEAR  CLEAR   Specific Gravity, Urine 1.020  1.005 - 1.030   pH 6.0  5.0 -  8.0   Glucose, UA NEGATIVE  NEGATIVE mg/dL   Hgb urine dipstick TRACE (*) NEGATIVE   Bilirubin Urine NEGATIVE  NEGATIVE   Ketones, ur NEGATIVE  NEGATIVE mg/dL   Protein, ur NEGATIVE  NEGATIVE mg/dL   Urobilinogen, UA 0.2  0.0 - 1.0 mg/dL   Nitrite NEGATIVE  NEGATIVE   Leukocytes, UA MODERATE (*) NEGATIVE  URINE MICROSCOPIC-ADD ON     Status: Abnormal   Collection Time    06/28/12  8:12 PM      Result Value Range   Squamous Epithelial / LPF RARE  RARE   WBC, UA 3-6  <3 WBC/hpf   RBC / HPF 0-2  <3 RBC/hpf   Bacteria, UA FEW (*) RARE  CBC     Status: None   Collection Time    06/28/12  9:10 PM      Result Value Range   WBC 7.7  4.0 - 10.5 K/uL   RBC 4.81  3.87 - 5.11 MIL/uL   Hemoglobin 13.3  12.0 - 15.0 g/dL   HCT 40.9  81.1 - 91.4 %   MCV 83.0  78.0 - 100.0 fL   MCH 27.7  26.0 -  34.0 pg   MCHC 33.3  30.0 - 36.0 g/dL   RDW 78.2  95.6 - 21.3 %   Platelets 269  150 - 400 K/uL   2130: Spoke with Dr. Dareen Piano, will culture the wound, and he will come in to see the patient.  2230: Dr Dareen Piano has been in to see the patient. OK for DC home.   Assessment and Plan   1. Delayed surgical wound healing, initial encounter    FU with Dr. Dareen Piano as scheduled   Tawnya Crook 06/28/2012, 9:15 PM

## 2012-06-28 NOTE — MAU Note (Signed)
Pt reports she had surgery on 06/20 for cancer cells on vulva, had tissue removed from vulva and back toward rectum. States she has had a discharge and odor and the discharge is green. States she has burning in vaginal area.

## 2012-06-30 LAB — URINE CULTURE: Colony Count: 35000

## 2012-07-01 LAB — WOUND CULTURE: Gram Stain: NONE SEEN

## 2012-07-06 NOTE — Op Note (Signed)
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  PROCEDURE:  Wide local excision of vulva.  SURGEON:  Philip Aspen, DO.  ANESTHESIA:  Local with MAC.  ESTIMATED BLOOD LOSS:  10 mL.  URINE OUTPUT:  300 mL.  FLUIDS:  1800 mL.  SPECIMENS:  Midline to right posterior vulva at vaginal introitus. Disposition of specimen to pathology.  COUNTS:  Sponge, lap, and needle counts correct x2.  FINDINGS:  Approximately 4 cm x 3 cm lesion extending from midline to patient's right at the largest dimension.  DESCRIPTION OF PROCEDURE:  Colposcopy with acetic acid was performed prior to excision and the major portion of the lesion was well visualized with competition of possible small areas affected within several centimeters of main lesion.  These small areas were visualized through colposcopy only and were 2 mm to 3 mm areas that were not extensively affecting the remaining tissue.  With colposcopic examination completed, the needle tip Bovie was used to delineate the premarked area of the major lesion.  Forceps were used to grasp the edges and removed a several millimeter depth portion of the skin and affecting the subcutaneous tissue.  There was excellent hemostasis on this area and the skin was reapproximated and closed with Vicryl in approximately 12 to 15 separate vertical mattress sutures.  This was done in a retention type manner.  Excellent hemostasis was noted.  Lidocaine was placed at completion  for postoperative comfort.  Sponge, lap, and needle counts were correct x2. Then, the patient was taken to recovery in stable condition.          ______________________________ Philip Aspen, DO     [place]/MEDQ  D:  07/06/2012  T:  07/06/2012  Job:  [id number]

## 2012-07-17 DIAGNOSIS — M545 Low back pain: Secondary | ICD-10-CM | POA: Diagnosis not present

## 2012-07-17 DIAGNOSIS — M4716 Other spondylosis with myelopathy, lumbar region: Secondary | ICD-10-CM | POA: Diagnosis not present

## 2012-07-17 DIAGNOSIS — M5137 Other intervertebral disc degeneration, lumbosacral region: Secondary | ICD-10-CM | POA: Diagnosis not present

## 2012-07-17 DIAGNOSIS — M999 Biomechanical lesion, unspecified: Secondary | ICD-10-CM | POA: Diagnosis not present

## 2012-07-18 DIAGNOSIS — N901 Moderate vulvar dysplasia: Secondary | ICD-10-CM | POA: Diagnosis not present

## 2012-07-24 DIAGNOSIS — M999 Biomechanical lesion, unspecified: Secondary | ICD-10-CM | POA: Diagnosis not present

## 2012-07-24 DIAGNOSIS — M545 Low back pain: Secondary | ICD-10-CM | POA: Diagnosis not present

## 2012-07-24 DIAGNOSIS — M5137 Other intervertebral disc degeneration, lumbosacral region: Secondary | ICD-10-CM | POA: Diagnosis not present

## 2012-07-24 DIAGNOSIS — M4716 Other spondylosis with myelopathy, lumbar region: Secondary | ICD-10-CM | POA: Diagnosis not present

## 2012-08-01 DIAGNOSIS — M5137 Other intervertebral disc degeneration, lumbosacral region: Secondary | ICD-10-CM | POA: Diagnosis not present

## 2012-08-01 DIAGNOSIS — M4716 Other spondylosis with myelopathy, lumbar region: Secondary | ICD-10-CM | POA: Diagnosis not present

## 2012-08-01 DIAGNOSIS — M545 Low back pain: Secondary | ICD-10-CM | POA: Diagnosis not present

## 2012-08-01 DIAGNOSIS — M999 Biomechanical lesion, unspecified: Secondary | ICD-10-CM | POA: Diagnosis not present

## 2012-08-07 DIAGNOSIS — L255 Unspecified contact dermatitis due to plants, except food: Secondary | ICD-10-CM | POA: Diagnosis not present

## 2012-08-14 DIAGNOSIS — M4716 Other spondylosis with myelopathy, lumbar region: Secondary | ICD-10-CM | POA: Diagnosis not present

## 2012-08-14 DIAGNOSIS — M999 Biomechanical lesion, unspecified: Secondary | ICD-10-CM | POA: Diagnosis not present

## 2012-08-14 DIAGNOSIS — M5137 Other intervertebral disc degeneration, lumbosacral region: Secondary | ICD-10-CM | POA: Diagnosis not present

## 2012-08-14 DIAGNOSIS — M545 Low back pain: Secondary | ICD-10-CM | POA: Diagnosis not present

## 2012-08-16 DIAGNOSIS — N901 Moderate vulvar dysplasia: Secondary | ICD-10-CM | POA: Diagnosis not present

## 2012-08-21 DIAGNOSIS — N390 Urinary tract infection, site not specified: Secondary | ICD-10-CM | POA: Diagnosis not present

## 2012-09-04 DIAGNOSIS — M999 Biomechanical lesion, unspecified: Secondary | ICD-10-CM | POA: Diagnosis not present

## 2012-09-04 DIAGNOSIS — M5137 Other intervertebral disc degeneration, lumbosacral region: Secondary | ICD-10-CM | POA: Diagnosis not present

## 2012-09-04 DIAGNOSIS — M545 Low back pain: Secondary | ICD-10-CM | POA: Diagnosis not present

## 2012-09-04 DIAGNOSIS — M4716 Other spondylosis with myelopathy, lumbar region: Secondary | ICD-10-CM | POA: Diagnosis not present

## 2012-09-11 DIAGNOSIS — M999 Biomechanical lesion, unspecified: Secondary | ICD-10-CM | POA: Diagnosis not present

## 2012-09-11 DIAGNOSIS — M4716 Other spondylosis with myelopathy, lumbar region: Secondary | ICD-10-CM | POA: Diagnosis not present

## 2012-09-11 DIAGNOSIS — M545 Low back pain: Secondary | ICD-10-CM | POA: Diagnosis not present

## 2012-09-11 DIAGNOSIS — M5137 Other intervertebral disc degeneration, lumbosacral region: Secondary | ICD-10-CM | POA: Diagnosis not present

## 2012-09-12 DIAGNOSIS — N879 Dysplasia of cervix uteri, unspecified: Secondary | ICD-10-CM | POA: Diagnosis not present

## 2012-09-12 DIAGNOSIS — N390 Urinary tract infection, site not specified: Secondary | ICD-10-CM | POA: Diagnosis not present

## 2012-09-13 DIAGNOSIS — E785 Hyperlipidemia, unspecified: Secondary | ICD-10-CM | POA: Diagnosis not present

## 2012-09-13 DIAGNOSIS — I10 Essential (primary) hypertension: Secondary | ICD-10-CM | POA: Diagnosis not present

## 2012-09-20 DIAGNOSIS — M5137 Other intervertebral disc degeneration, lumbosacral region: Secondary | ICD-10-CM | POA: Diagnosis not present

## 2012-09-20 DIAGNOSIS — M999 Biomechanical lesion, unspecified: Secondary | ICD-10-CM | POA: Diagnosis not present

## 2012-09-20 DIAGNOSIS — M545 Low back pain: Secondary | ICD-10-CM | POA: Diagnosis not present

## 2012-09-20 DIAGNOSIS — M4716 Other spondylosis with myelopathy, lumbar region: Secondary | ICD-10-CM | POA: Diagnosis not present

## 2012-09-27 DIAGNOSIS — M4716 Other spondylosis with myelopathy, lumbar region: Secondary | ICD-10-CM | POA: Diagnosis not present

## 2012-09-27 DIAGNOSIS — M545 Low back pain: Secondary | ICD-10-CM | POA: Diagnosis not present

## 2012-09-27 DIAGNOSIS — M5137 Other intervertebral disc degeneration, lumbosacral region: Secondary | ICD-10-CM | POA: Diagnosis not present

## 2012-09-27 DIAGNOSIS — M999 Biomechanical lesion, unspecified: Secondary | ICD-10-CM | POA: Diagnosis not present

## 2012-10-01 DIAGNOSIS — M545 Low back pain: Secondary | ICD-10-CM | POA: Diagnosis not present

## 2012-10-01 DIAGNOSIS — M4716 Other spondylosis with myelopathy, lumbar region: Secondary | ICD-10-CM | POA: Diagnosis not present

## 2012-10-01 DIAGNOSIS — M999 Biomechanical lesion, unspecified: Secondary | ICD-10-CM | POA: Diagnosis not present

## 2012-10-01 DIAGNOSIS — M5137 Other intervertebral disc degeneration, lumbosacral region: Secondary | ICD-10-CM | POA: Diagnosis not present

## 2012-10-24 DIAGNOSIS — M545 Low back pain: Secondary | ICD-10-CM | POA: Diagnosis not present

## 2012-10-24 DIAGNOSIS — M4716 Other spondylosis with myelopathy, lumbar region: Secondary | ICD-10-CM | POA: Diagnosis not present

## 2012-10-24 DIAGNOSIS — M999 Biomechanical lesion, unspecified: Secondary | ICD-10-CM | POA: Diagnosis not present

## 2012-10-24 DIAGNOSIS — M5137 Other intervertebral disc degeneration, lumbosacral region: Secondary | ICD-10-CM | POA: Diagnosis not present

## 2012-11-08 DIAGNOSIS — R42 Dizziness and giddiness: Secondary | ICD-10-CM | POA: Diagnosis not present

## 2012-11-08 DIAGNOSIS — R000 Tachycardia, unspecified: Secondary | ICD-10-CM | POA: Diagnosis not present

## 2012-11-08 DIAGNOSIS — J309 Allergic rhinitis, unspecified: Secondary | ICD-10-CM | POA: Diagnosis not present

## 2012-11-08 DIAGNOSIS — S61209A Unspecified open wound of unspecified finger without damage to nail, initial encounter: Secondary | ICD-10-CM | POA: Diagnosis not present

## 2012-11-09 DIAGNOSIS — M545 Low back pain: Secondary | ICD-10-CM | POA: Diagnosis not present

## 2012-11-09 DIAGNOSIS — M5137 Other intervertebral disc degeneration, lumbosacral region: Secondary | ICD-10-CM | POA: Diagnosis not present

## 2012-11-09 DIAGNOSIS — M4716 Other spondylosis with myelopathy, lumbar region: Secondary | ICD-10-CM | POA: Diagnosis not present

## 2012-11-09 DIAGNOSIS — M999 Biomechanical lesion, unspecified: Secondary | ICD-10-CM | POA: Diagnosis not present

## 2012-11-12 DIAGNOSIS — M4716 Other spondylosis with myelopathy, lumbar region: Secondary | ICD-10-CM | POA: Diagnosis not present

## 2012-11-12 DIAGNOSIS — M5137 Other intervertebral disc degeneration, lumbosacral region: Secondary | ICD-10-CM | POA: Diagnosis not present

## 2012-11-12 DIAGNOSIS — M999 Biomechanical lesion, unspecified: Secondary | ICD-10-CM | POA: Diagnosis not present

## 2012-11-12 DIAGNOSIS — M545 Low back pain: Secondary | ICD-10-CM | POA: Diagnosis not present

## 2012-11-14 ENCOUNTER — Other Ambulatory Visit: Payer: Self-pay | Admitting: Obstetrics and Gynecology

## 2012-11-14 DIAGNOSIS — M545 Low back pain: Secondary | ICD-10-CM | POA: Diagnosis not present

## 2012-11-14 DIAGNOSIS — M5137 Other intervertebral disc degeneration, lumbosacral region: Secondary | ICD-10-CM | POA: Diagnosis not present

## 2012-11-14 DIAGNOSIS — M999 Biomechanical lesion, unspecified: Secondary | ICD-10-CM | POA: Diagnosis not present

## 2012-11-14 DIAGNOSIS — D280 Benign neoplasm of vulva: Secondary | ICD-10-CM | POA: Diagnosis not present

## 2012-11-14 DIAGNOSIS — D4959 Neoplasm of unspecified behavior of other genitourinary organ: Secondary | ICD-10-CM | POA: Diagnosis not present

## 2012-11-14 DIAGNOSIS — M4716 Other spondylosis with myelopathy, lumbar region: Secondary | ICD-10-CM | POA: Diagnosis not present

## 2012-11-19 DIAGNOSIS — M545 Low back pain: Secondary | ICD-10-CM | POA: Diagnosis not present

## 2012-11-19 DIAGNOSIS — M4716 Other spondylosis with myelopathy, lumbar region: Secondary | ICD-10-CM | POA: Diagnosis not present

## 2012-11-19 DIAGNOSIS — M999 Biomechanical lesion, unspecified: Secondary | ICD-10-CM | POA: Diagnosis not present

## 2012-11-19 DIAGNOSIS — M5137 Other intervertebral disc degeneration, lumbosacral region: Secondary | ICD-10-CM | POA: Diagnosis not present

## 2012-11-26 DIAGNOSIS — M999 Biomechanical lesion, unspecified: Secondary | ICD-10-CM | POA: Diagnosis not present

## 2012-11-26 DIAGNOSIS — M5137 Other intervertebral disc degeneration, lumbosacral region: Secondary | ICD-10-CM | POA: Diagnosis not present

## 2012-11-26 DIAGNOSIS — M545 Low back pain: Secondary | ICD-10-CM | POA: Diagnosis not present

## 2012-11-26 DIAGNOSIS — M4716 Other spondylosis with myelopathy, lumbar region: Secondary | ICD-10-CM | POA: Diagnosis not present

## 2012-12-05 DIAGNOSIS — M545 Low back pain: Secondary | ICD-10-CM | POA: Diagnosis not present

## 2012-12-05 DIAGNOSIS — M5137 Other intervertebral disc degeneration, lumbosacral region: Secondary | ICD-10-CM | POA: Diagnosis not present

## 2012-12-05 DIAGNOSIS — M4716 Other spondylosis with myelopathy, lumbar region: Secondary | ICD-10-CM | POA: Diagnosis not present

## 2012-12-05 DIAGNOSIS — M999 Biomechanical lesion, unspecified: Secondary | ICD-10-CM | POA: Diagnosis not present

## 2012-12-10 DIAGNOSIS — M545 Low back pain: Secondary | ICD-10-CM | POA: Diagnosis not present

## 2012-12-10 DIAGNOSIS — M4716 Other spondylosis with myelopathy, lumbar region: Secondary | ICD-10-CM | POA: Diagnosis not present

## 2012-12-10 DIAGNOSIS — M5137 Other intervertebral disc degeneration, lumbosacral region: Secondary | ICD-10-CM | POA: Diagnosis not present

## 2012-12-10 DIAGNOSIS — M999 Biomechanical lesion, unspecified: Secondary | ICD-10-CM | POA: Diagnosis not present

## 2012-12-19 DIAGNOSIS — M5137 Other intervertebral disc degeneration, lumbosacral region: Secondary | ICD-10-CM | POA: Diagnosis not present

## 2012-12-19 DIAGNOSIS — M545 Low back pain: Secondary | ICD-10-CM | POA: Diagnosis not present

## 2012-12-19 DIAGNOSIS — M4716 Other spondylosis with myelopathy, lumbar region: Secondary | ICD-10-CM | POA: Diagnosis not present

## 2012-12-19 DIAGNOSIS — M999 Biomechanical lesion, unspecified: Secondary | ICD-10-CM | POA: Diagnosis not present

## 2013-01-29 DIAGNOSIS — N8189 Other female genital prolapse: Secondary | ICD-10-CM | POA: Diagnosis not present

## 2013-02-06 DIAGNOSIS — IMO0002 Reserved for concepts with insufficient information to code with codable children: Secondary | ICD-10-CM | POA: Diagnosis not present

## 2013-02-06 DIAGNOSIS — M999 Biomechanical lesion, unspecified: Secondary | ICD-10-CM | POA: Diagnosis not present

## 2013-02-08 DIAGNOSIS — M9981 Other biomechanical lesions of cervical region: Secondary | ICD-10-CM | POA: Diagnosis not present

## 2013-02-08 DIAGNOSIS — M5430 Sciatica, unspecified side: Secondary | ICD-10-CM | POA: Diagnosis not present

## 2013-02-08 DIAGNOSIS — M999 Biomechanical lesion, unspecified: Secondary | ICD-10-CM | POA: Diagnosis not present

## 2013-02-11 DIAGNOSIS — H26499 Other secondary cataract, unspecified eye: Secondary | ICD-10-CM | POA: Diagnosis not present

## 2013-02-12 DIAGNOSIS — M999 Biomechanical lesion, unspecified: Secondary | ICD-10-CM | POA: Diagnosis not present

## 2013-02-12 DIAGNOSIS — M5430 Sciatica, unspecified side: Secondary | ICD-10-CM | POA: Diagnosis not present

## 2013-02-12 DIAGNOSIS — M9981 Other biomechanical lesions of cervical region: Secondary | ICD-10-CM | POA: Diagnosis not present

## 2013-03-14 DIAGNOSIS — Z6830 Body mass index (BMI) 30.0-30.9, adult: Secondary | ICD-10-CM | POA: Diagnosis not present

## 2013-03-14 DIAGNOSIS — E785 Hyperlipidemia, unspecified: Secondary | ICD-10-CM | POA: Diagnosis not present

## 2013-03-14 DIAGNOSIS — I10 Essential (primary) hypertension: Secondary | ICD-10-CM | POA: Diagnosis not present

## 2013-05-21 ENCOUNTER — Other Ambulatory Visit: Payer: Self-pay | Admitting: Obstetrics and Gynecology

## 2013-05-21 DIAGNOSIS — N900 Mild vulvar dysplasia: Secondary | ICD-10-CM | POA: Diagnosis not present

## 2013-05-21 DIAGNOSIS — Z01419 Encounter for gynecological examination (general) (routine) without abnormal findings: Secondary | ICD-10-CM | POA: Diagnosis not present

## 2013-05-21 DIAGNOSIS — D280 Benign neoplasm of vulva: Secondary | ICD-10-CM | POA: Diagnosis not present

## 2013-05-24 DIAGNOSIS — G43809 Other migraine, not intractable, without status migrainosus: Secondary | ICD-10-CM | POA: Diagnosis not present

## 2013-06-11 DIAGNOSIS — M999 Biomechanical lesion, unspecified: Secondary | ICD-10-CM | POA: Diagnosis not present

## 2013-06-11 DIAGNOSIS — M531 Cervicobrachial syndrome: Secondary | ICD-10-CM | POA: Diagnosis not present

## 2013-06-11 DIAGNOSIS — M5137 Other intervertebral disc degeneration, lumbosacral region: Secondary | ICD-10-CM | POA: Diagnosis not present

## 2013-06-11 DIAGNOSIS — M9981 Other biomechanical lesions of cervical region: Secondary | ICD-10-CM | POA: Diagnosis not present

## 2013-06-28 DIAGNOSIS — M9981 Other biomechanical lesions of cervical region: Secondary | ICD-10-CM | POA: Diagnosis not present

## 2013-06-28 DIAGNOSIS — M531 Cervicobrachial syndrome: Secondary | ICD-10-CM | POA: Diagnosis not present

## 2013-06-28 DIAGNOSIS — M999 Biomechanical lesion, unspecified: Secondary | ICD-10-CM | POA: Diagnosis not present

## 2013-06-28 DIAGNOSIS — M5137 Other intervertebral disc degeneration, lumbosacral region: Secondary | ICD-10-CM | POA: Diagnosis not present

## 2013-07-05 DIAGNOSIS — M5137 Other intervertebral disc degeneration, lumbosacral region: Secondary | ICD-10-CM | POA: Diagnosis not present

## 2013-07-05 DIAGNOSIS — M999 Biomechanical lesion, unspecified: Secondary | ICD-10-CM | POA: Diagnosis not present

## 2013-07-05 DIAGNOSIS — M531 Cervicobrachial syndrome: Secondary | ICD-10-CM | POA: Diagnosis not present

## 2013-07-05 DIAGNOSIS — M9981 Other biomechanical lesions of cervical region: Secondary | ICD-10-CM | POA: Diagnosis not present

## 2013-07-08 DIAGNOSIS — M531 Cervicobrachial syndrome: Secondary | ICD-10-CM | POA: Diagnosis not present

## 2013-07-08 DIAGNOSIS — M9981 Other biomechanical lesions of cervical region: Secondary | ICD-10-CM | POA: Diagnosis not present

## 2013-07-08 DIAGNOSIS — M5137 Other intervertebral disc degeneration, lumbosacral region: Secondary | ICD-10-CM | POA: Diagnosis not present

## 2013-07-08 DIAGNOSIS — M999 Biomechanical lesion, unspecified: Secondary | ICD-10-CM | POA: Diagnosis not present

## 2013-07-12 DIAGNOSIS — M9981 Other biomechanical lesions of cervical region: Secondary | ICD-10-CM | POA: Diagnosis not present

## 2013-07-12 DIAGNOSIS — M531 Cervicobrachial syndrome: Secondary | ICD-10-CM | POA: Diagnosis not present

## 2013-07-12 DIAGNOSIS — M5137 Other intervertebral disc degeneration, lumbosacral region: Secondary | ICD-10-CM | POA: Diagnosis not present

## 2013-07-12 DIAGNOSIS — M999 Biomechanical lesion, unspecified: Secondary | ICD-10-CM | POA: Diagnosis not present

## 2013-07-17 DIAGNOSIS — M999 Biomechanical lesion, unspecified: Secondary | ICD-10-CM | POA: Diagnosis not present

## 2013-07-17 DIAGNOSIS — M531 Cervicobrachial syndrome: Secondary | ICD-10-CM | POA: Diagnosis not present

## 2013-07-17 DIAGNOSIS — M9981 Other biomechanical lesions of cervical region: Secondary | ICD-10-CM | POA: Diagnosis not present

## 2013-07-17 DIAGNOSIS — M5137 Other intervertebral disc degeneration, lumbosacral region: Secondary | ICD-10-CM | POA: Diagnosis not present

## 2013-07-24 DIAGNOSIS — M999 Biomechanical lesion, unspecified: Secondary | ICD-10-CM | POA: Diagnosis not present

## 2013-07-24 DIAGNOSIS — M5137 Other intervertebral disc degeneration, lumbosacral region: Secondary | ICD-10-CM | POA: Diagnosis not present

## 2013-07-24 DIAGNOSIS — M531 Cervicobrachial syndrome: Secondary | ICD-10-CM | POA: Diagnosis not present

## 2013-07-24 DIAGNOSIS — M9981 Other biomechanical lesions of cervical region: Secondary | ICD-10-CM | POA: Diagnosis not present

## 2013-07-25 DIAGNOSIS — R112 Nausea with vomiting, unspecified: Secondary | ICD-10-CM | POA: Diagnosis not present

## 2013-07-25 DIAGNOSIS — R51 Headache: Secondary | ICD-10-CM | POA: Diagnosis not present

## 2013-07-25 DIAGNOSIS — R109 Unspecified abdominal pain: Secondary | ICD-10-CM | POA: Diagnosis not present

## 2013-07-25 DIAGNOSIS — Z79899 Other long term (current) drug therapy: Secondary | ICD-10-CM | POA: Diagnosis not present

## 2013-07-25 DIAGNOSIS — I10 Essential (primary) hypertension: Secondary | ICD-10-CM | POA: Diagnosis not present

## 2013-07-30 DIAGNOSIS — H539 Unspecified visual disturbance: Secondary | ICD-10-CM | POA: Diagnosis not present

## 2013-07-30 DIAGNOSIS — R51 Headache: Secondary | ICD-10-CM | POA: Diagnosis not present

## 2013-07-30 DIAGNOSIS — I10 Essential (primary) hypertension: Secondary | ICD-10-CM | POA: Diagnosis not present

## 2013-07-30 DIAGNOSIS — R930 Abnormal findings on diagnostic imaging of skull and head, not elsewhere classified: Secondary | ICD-10-CM | POA: Diagnosis not present

## 2013-07-31 DIAGNOSIS — M999 Biomechanical lesion, unspecified: Secondary | ICD-10-CM | POA: Diagnosis not present

## 2013-07-31 DIAGNOSIS — M9981 Other biomechanical lesions of cervical region: Secondary | ICD-10-CM | POA: Diagnosis not present

## 2013-07-31 DIAGNOSIS — M5137 Other intervertebral disc degeneration, lumbosacral region: Secondary | ICD-10-CM | POA: Diagnosis not present

## 2013-07-31 DIAGNOSIS — M531 Cervicobrachial syndrome: Secondary | ICD-10-CM | POA: Diagnosis not present

## 2013-08-03 DIAGNOSIS — R51 Headache: Secondary | ICD-10-CM | POA: Diagnosis not present

## 2013-08-03 DIAGNOSIS — H539 Unspecified visual disturbance: Secondary | ICD-10-CM | POA: Diagnosis not present

## 2013-08-03 DIAGNOSIS — G35 Multiple sclerosis: Secondary | ICD-10-CM | POA: Diagnosis not present

## 2013-08-03 DIAGNOSIS — R930 Abnormal findings on diagnostic imaging of skull and head, not elsewhere classified: Secondary | ICD-10-CM | POA: Diagnosis not present

## 2013-08-07 DIAGNOSIS — K2970 Gastritis, unspecified, without bleeding: Secondary | ICD-10-CM | POA: Diagnosis not present

## 2013-08-07 DIAGNOSIS — R112 Nausea with vomiting, unspecified: Secondary | ICD-10-CM | POA: Diagnosis not present

## 2013-08-07 DIAGNOSIS — R51 Headache: Secondary | ICD-10-CM | POA: Diagnosis not present

## 2013-08-07 DIAGNOSIS — Z79899 Other long term (current) drug therapy: Secondary | ICD-10-CM | POA: Diagnosis not present

## 2013-08-07 DIAGNOSIS — R42 Dizziness and giddiness: Secondary | ICD-10-CM | POA: Diagnosis not present

## 2013-08-07 DIAGNOSIS — I10 Essential (primary) hypertension: Secondary | ICD-10-CM | POA: Diagnosis not present

## 2013-08-09 DIAGNOSIS — G4700 Insomnia, unspecified: Secondary | ICD-10-CM | POA: Diagnosis not present

## 2013-08-09 DIAGNOSIS — Z6830 Body mass index (BMI) 30.0-30.9, adult: Secondary | ICD-10-CM | POA: Diagnosis not present

## 2013-08-09 DIAGNOSIS — I10 Essential (primary) hypertension: Secondary | ICD-10-CM | POA: Diagnosis not present

## 2013-08-14 ENCOUNTER — Encounter: Payer: Self-pay | Admitting: *Deleted

## 2013-08-15 ENCOUNTER — Ambulatory Visit (INDEPENDENT_AMBULATORY_CARE_PROVIDER_SITE_OTHER): Payer: Medicare Other | Admitting: Neurology

## 2013-08-15 ENCOUNTER — Encounter: Payer: Self-pay | Admitting: Neurology

## 2013-08-15 VITALS — BP 141/86 | HR 87 | Ht 63.0 in | Wt 173.0 lb

## 2013-08-15 DIAGNOSIS — G609 Hereditary and idiopathic neuropathy, unspecified: Secondary | ICD-10-CM | POA: Insufficient documentation

## 2013-08-15 DIAGNOSIS — E785 Hyperlipidemia, unspecified: Secondary | ICD-10-CM | POA: Insufficient documentation

## 2013-08-15 DIAGNOSIS — I10 Essential (primary) hypertension: Secondary | ICD-10-CM

## 2013-08-15 DIAGNOSIS — F411 Generalized anxiety disorder: Secondary | ICD-10-CM | POA: Insufficient documentation

## 2013-08-15 HISTORY — DX: Generalized anxiety disorder: F41.1

## 2013-08-15 HISTORY — DX: Essential (primary) hypertension: I10

## 2013-08-15 HISTORY — DX: Hyperlipidemia, unspecified: E78.5

## 2013-08-15 HISTORY — DX: Hereditary and idiopathic neuropathy, unspecified: G60.9

## 2013-08-15 MED ORDER — LORAZEPAM 0.5 MG PO TABS: 0.2500 mg | ORAL_TABLET | Freq: Three times a day (TID) | ORAL | Status: DC | PRN

## 2013-08-15 NOTE — Progress Notes (Signed)
[facility]    Provider:  Dr Jaynee Eagles Referring Provider: Leonides Sake, MD Primary Care Physician:  Laverna Peace, NP  CCSunday Corn headed  HPI:  Sharon Marsh is a 68 y.o. female here as a referral from Dr. Lisbeth Ply for Concerning MRI results in the setting of hypertensive urgency/emergency  68 year old who is here for evaluation of concerning MRI results after being seen in the ED for hypertensive urgency/emergency for the first time in July. She was cleaning a house and was dehydrated and had not eaten at which time she started feeling light headed. Felt better after laying down and drinking water. But the next day felt light headed again. At bed time felt nauseated and light headed and felt like she was going to "pass out". Went to the ED the next day and blood pressure was over 200. Blood pressure had historically been around 130-140 but was "inching up" for several months and couldn't get a doctor appointment. She had a CAT scan completed in the ED which showed some white matter changes and the following Saturday had the MRI of the brain completed which showed non-specific white matter changes in the subcortical and periventricular white matter as well as in the pons. . 2 weeks later was back in the ED with lightheadedness and nausea and vomiting and BP was high again 190s/90s. Was still having trouble and saw a doc who doubled the metoprolol and started additional HTN medication. 2 nights ago had an episode of light headedness and blood pressure was also high but can't remember how high. Symptoms have always resolved with decrease in blood pressure. Blood pressure has been improving with new medication and she feels much better. Symptoms have resolved including some ringing in the ears and flashing lights she saw during the episodes.   Denies weakness, no vision loss, no changes in speech, no sensory changes other than numbness in the toes that is worse at night with decreased  balance, no cognitive changes, no current focal neurologic symptoms. No confusion in the emergency room. No PMHx of headaches. Denies any PMHx of neurologic symptoms, vision loss. Does report that anxiety may be making the episodes worse or even causing the episodes; daughter is here and agreed that her mom gets anxious making the problem even worse.  Reviewed notes, labs and imaging from outside physicians, which showed:  Moderate non-specicific ischemic white matter changes in the sub cortical white matter ,  perrivascularly and in the pons. Reviewed images personally and with neuroradiologist. Previous physician and ED notes: CMP unremarkable, total cholesterol 279, HDL 48, LDL 197.    Review of Systems: Out of a complete 14 system review, the patient complains of only the following symptoms, and all other reviewed systems are negative. Easy bruising, joint pain, aching muscles, headache, snoring, insomnia, restless legs, runny nose, skin sensitivity  History   Social History  . Marital Status: Widowed    Spouse Name: N/A    Number of Children: 2  . Years of Education: college   Occupational History  . Not on file.   Social History Main Topics  . Smoking status: Former Research scientist (life sciences)  . Smokeless tobacco: Never Used  . Alcohol Use: Yes     Comment: socially  . Drug Use: No  . Sexual Activity: Yes    Birth Control/ Protection: None   Other Topics Concern  . Not on file   Social History Narrative   Patient is widowed with 2 children.  Patient is right handed.   Patient has some college.   Patient drinks 1 cup daily.    Family History  Problem Relation Age of Onset  . Heart attack Father   . Heart disease Mother   . Hypercholesterolemia Father   . Hypertension Mother   . Hypertension Father     Past Medical History  Diagnosis Date  . PONV (postoperative nausea and vomiting)   . Hypertension   . GERD (gastroesophageal reflux disease)   . Hiatal hernia   . Other and  unspecified hyperlipidemia   . Personal history of vulvar dysplasia     Past Surgical History  Procedure Laterality Date  . Appendectomy  1950  . Abdominal hysterectomy    . Vulvectomy N/A 06/22/2012    Procedure: WIDE EXCISION VULVECTOMY;  Surgeon: Allyn Kenner, DO;  Location: [facility];  Service: Gynecology;  Laterality: N/A;  wide local excision of vulva  . Thyroid surgery  1980  . Wide local excision high grade vin  06/22/2012    Dr Rogue Bussing    Current Outpatient Prescriptions  Medication Sig Dispense Refill  . amLODipine (NORVASC) 5 MG tablet Take 5 mg by mouth daily.      . Ascorbic Acid ([employer]S VITAMIN C) 1000 MG CHEW Chew 1,000 mg by mouth daily.      Marland Kitchen b complex vitamins tablet Take 1 tablet by mouth daily.      . Flaxseed, Linseed, (FLAX SEED OIL PO) Take 1,000 mg by mouth daily.       Marland Kitchen GINKGO BILOBA PO Take 600 mg by mouth.       Marland Kitchen ibuprofen (ADVIL,MOTRIN) 200 MG tablet Take 800 mg by mouth 2 (two) times daily as needed for pain.      . metoprolol (LOPRESSOR) 50 MG tablet Take 50 mg by mouth 2 (two) times daily. Take 1/2 tablet two times daily      . omeprazole (PRILOSEC) 20 MG capsule Take 20 mg by mouth daily.      . Red Yeast Rice 600 MG CAPS Take 600 mg by mouth daily.       No current facility-administered medications for this visit.    Allergies as of 08/15/2013 - Review Complete 08/15/2013  Allergen Reaction Noted  . Clindamycin/lincomycin Hypertension 07/13/2011  . Latex Other (See Comments) 06/22/2012    Vitals: BP 141/86  Pulse 87  Ht 5\' 3"  (1.6 m)  Wt 173 lb (78.472 kg)  BMI 30.65 kg/m2 Last Weight:  Wt Readings from Last 1 Encounters:  08/15/13 173 lb (78.472 kg)   Last Height:   Ht Readings from Last 1 Encounters:  08/15/13 5\' 3"  (1.6 m)     Physical exam: Exam: Gen: NAD, conversant Eyes: anicteric sclerae, moist conjunctivae HENT: Atraumatic, oropharynx clear Neck: Trachea midline; supple,  Lungs: CTA, no wheezing, rales, rhonic                           CV: RRR, no MRG Abdomen: Soft, non-tender;  Extremities: No peripheral edema  Skin: Normal temperature, no rash,  Psych: Appropriate affect, pleasant  Neuro: MS: AA&Ox3, appropriately interactive, normal affect   Attention: normal  Speech: fluent w/o paraphasic error  Memory: good recent and remote recall  CN: PERRL, EOMI no nystagmus, fundi flat, visual fields intact to confrontation, no ptosis, sensation intact to LT V1-V3 bilat, face symmetric, no weakness, hearing grossly intact, palate elevates symmetrically, shoulder shrug 5/5 bilat,  tongue protrudes midline, no  fasiculations noted.  Motor: normal bulk and tone Strength: 5/5  In all extremities  Coord: rapid alternating and point-to-point (FNF, HTS) movements intact.  Reflexes: symmetrical, bilat downgoing toes  Sens: LT intact in all extremities. Decreased PP, Vibration, Proprioception distally in the lower extremities.   Gait: posture, stance, stride and arm-swing normal. Tandem gait intact. Able to walk on heels and toes. Romberg absent.   Assessment and plan: 68 year old female with a PMHx of HTN, Hypertensive urgency, Hyperlipidemia who is here for evaluation of concerning MRI results after being seen in the ED for hypertensive urgency/emergency and lightheadedness and nausea and vomiting, headache, visual disturbances, ringing in the ears. Symptoms resolved with BP management. Neuro exam significant for mild decreased sensation in the distal lower extremities otherwise no current focal neurologic symptoms. Does report that anxiety may be making the episodes worse or even causing the episodes; daughter is here and agreed that her mom gets anxious making the problem even worse.   MRI images demonstrated Moderate non-specicific ischemic white matter changes in the sub cortical white matter ,  perrivascularly and in the pons.   1. Hypertensive urgency/emergency episodes: Symptoms resolved with BP  management and likely due to HTN. Reassured patient. If symptoms return outside the context of elevated BP go to emergency room and follow up here in clinic. 2. Anxiety: Will give patient lorazepam 0.25mg  prn bid. Discussed common side effects such as confusion and drowsiness especially in the elderly. Advised her to take it when she is home and is supervised and not to drive or operate equipment when taking.  3. Neuropathy: Mild and stable. Discussed neuropathy at length. Patient prefers to discuss with primary care. Suggest a neuropathy serum workup for common causes with primary care (patient declined to do here): TSH, B12, FT4, HgbA1c, Vit D, IFE, ANA w/ reflex. Return to clinic if symptoms worsen for further workup. 4. Ischemic disease: Advised close control of risk factors such as HTN, Glucose, Cholesterol, Diet. Patient is seeing her primary care next month and will discuss.  Sarina Ill, MD  [facility] [address] [place], [place] [address]  Phone [phone] Fax [phone]

## 2013-08-15 NOTE — Patient Instructions (Addendum)
I do want to suggest a few things today:   Remember to drink plenty of fluid, eat healthy meals and do not skip any meals. Try to eat protein with a every meal and eat a healthy snack such as fruit or nuts in between meals. Try to keep a regular sleep-wake schedule and try to exercise daily, particularly in the form of walking, 20-30 minutes a day, if you can.   As far as your medications are concerned, I would like to suggest ativan .25mg  as needed for anxiety up to three times a day. Do not drive or operate equipment until you know how the medication affects you. Most common side effects include cognition changes/confusion, drowsiness.   As far as diagnostic testing:   I would like to see you back in clinic if symptoms continue or worsen. Please call us with any interim questions, concerns, problems, updates or refill requests.  Call for any concerns. My clinical assistant and will answer any of your questions and relay your messages to me and also relay most of my messages to you.   Our phone number is [phone]. We also have an after hours call service for urgent matters and there is a physician on-call for urgent questions. For any emergencies you know to call 911 or go to the nearest emergency room

## 2013-08-20 DIAGNOSIS — R635 Abnormal weight gain: Secondary | ICD-10-CM | POA: Diagnosis not present

## 2013-08-20 DIAGNOSIS — Z8349 Family history of other endocrine, nutritional and metabolic diseases: Secondary | ICD-10-CM | POA: Diagnosis not present

## 2013-08-20 DIAGNOSIS — E785 Hyperlipidemia, unspecified: Secondary | ICD-10-CM | POA: Diagnosis not present

## 2013-08-20 DIAGNOSIS — G4710 Hypersomnia, unspecified: Secondary | ICD-10-CM | POA: Diagnosis not present

## 2013-08-20 DIAGNOSIS — Z6830 Body mass index (BMI) 30.0-30.9, adult: Secondary | ICD-10-CM | POA: Diagnosis not present

## 2013-08-20 DIAGNOSIS — I10 Essential (primary) hypertension: Secondary | ICD-10-CM | POA: Diagnosis not present

## 2013-08-22 DIAGNOSIS — G4710 Hypersomnia, unspecified: Secondary | ICD-10-CM | POA: Diagnosis not present

## 2013-08-22 DIAGNOSIS — G4730 Sleep apnea, unspecified: Secondary | ICD-10-CM | POA: Diagnosis not present

## 2013-09-02 DIAGNOSIS — M9981 Other biomechanical lesions of cervical region: Secondary | ICD-10-CM | POA: Diagnosis not present

## 2013-09-02 DIAGNOSIS — M531 Cervicobrachial syndrome: Secondary | ICD-10-CM | POA: Diagnosis not present

## 2013-09-02 DIAGNOSIS — M5137 Other intervertebral disc degeneration, lumbosacral region: Secondary | ICD-10-CM | POA: Diagnosis not present

## 2013-09-02 DIAGNOSIS — M999 Biomechanical lesion, unspecified: Secondary | ICD-10-CM | POA: Diagnosis not present

## 2013-09-19 DIAGNOSIS — Z6830 Body mass index (BMI) 30.0-30.9, adult: Secondary | ICD-10-CM | POA: Diagnosis not present

## 2013-09-19 DIAGNOSIS — I10 Essential (primary) hypertension: Secondary | ICD-10-CM | POA: Diagnosis not present

## 2013-09-19 DIAGNOSIS — Z1212 Encounter for screening for malignant neoplasm of rectum: Secondary | ICD-10-CM | POA: Diagnosis not present

## 2013-09-19 DIAGNOSIS — M722 Plantar fascial fibromatosis: Secondary | ICD-10-CM | POA: Diagnosis not present

## 2013-09-19 DIAGNOSIS — E785 Hyperlipidemia, unspecified: Secondary | ICD-10-CM | POA: Diagnosis not present

## 2013-09-23 DIAGNOSIS — M5137 Other intervertebral disc degeneration, lumbosacral region: Secondary | ICD-10-CM | POA: Diagnosis not present

## 2013-09-23 DIAGNOSIS — M531 Cervicobrachial syndrome: Secondary | ICD-10-CM | POA: Diagnosis not present

## 2013-09-23 DIAGNOSIS — M9981 Other biomechanical lesions of cervical region: Secondary | ICD-10-CM | POA: Diagnosis not present

## 2013-09-23 DIAGNOSIS — M999 Biomechanical lesion, unspecified: Secondary | ICD-10-CM | POA: Diagnosis not present

## 2013-09-30 DIAGNOSIS — L089 Local infection of the skin and subcutaneous tissue, unspecified: Secondary | ICD-10-CM | POA: Diagnosis not present

## 2013-09-30 DIAGNOSIS — S90569A Insect bite (nonvenomous), unspecified ankle, initial encounter: Secondary | ICD-10-CM | POA: Diagnosis not present

## 2013-09-30 DIAGNOSIS — Z6830 Body mass index (BMI) 30.0-30.9, adult: Secondary | ICD-10-CM | POA: Diagnosis not present

## 2013-10-02 DIAGNOSIS — M531 Cervicobrachial syndrome: Secondary | ICD-10-CM | POA: Diagnosis not present

## 2013-10-02 DIAGNOSIS — M999 Biomechanical lesion, unspecified: Secondary | ICD-10-CM | POA: Diagnosis not present

## 2013-10-02 DIAGNOSIS — M5137 Other intervertebral disc degeneration, lumbosacral region: Secondary | ICD-10-CM | POA: Diagnosis not present

## 2013-10-02 DIAGNOSIS — M9981 Other biomechanical lesions of cervical region: Secondary | ICD-10-CM | POA: Diagnosis not present

## 2013-10-04 DIAGNOSIS — T148 Other injury of unspecified body region: Secondary | ICD-10-CM | POA: Diagnosis not present

## 2013-10-06 DIAGNOSIS — Z1211 Encounter for screening for malignant neoplasm of colon: Secondary | ICD-10-CM | POA: Diagnosis not present

## 2013-10-16 DIAGNOSIS — M9903 Segmental and somatic dysfunction of lumbar region: Secondary | ICD-10-CM | POA: Diagnosis not present

## 2013-10-16 DIAGNOSIS — M531 Cervicobrachial syndrome: Secondary | ICD-10-CM | POA: Diagnosis not present

## 2013-10-16 DIAGNOSIS — M9902 Segmental and somatic dysfunction of thoracic region: Secondary | ICD-10-CM | POA: Diagnosis not present

## 2013-10-22 DIAGNOSIS — M9903 Segmental and somatic dysfunction of lumbar region: Secondary | ICD-10-CM | POA: Diagnosis not present

## 2013-10-22 DIAGNOSIS — M9902 Segmental and somatic dysfunction of thoracic region: Secondary | ICD-10-CM | POA: Diagnosis not present

## 2013-10-22 DIAGNOSIS — M531 Cervicobrachial syndrome: Secondary | ICD-10-CM | POA: Diagnosis not present

## 2013-10-24 DIAGNOSIS — Z9181 History of falling: Secondary | ICD-10-CM | POA: Diagnosis not present

## 2013-10-24 DIAGNOSIS — I10 Essential (primary) hypertension: Secondary | ICD-10-CM | POA: Diagnosis not present

## 2013-10-24 DIAGNOSIS — Z6831 Body mass index (BMI) 31.0-31.9, adult: Secondary | ICD-10-CM | POA: Diagnosis not present

## 2013-10-24 DIAGNOSIS — Z1389 Encounter for screening for other disorder: Secondary | ICD-10-CM | POA: Diagnosis not present

## 2013-10-25 DIAGNOSIS — M9902 Segmental and somatic dysfunction of thoracic region: Secondary | ICD-10-CM | POA: Diagnosis not present

## 2013-10-25 DIAGNOSIS — M9903 Segmental and somatic dysfunction of lumbar region: Secondary | ICD-10-CM | POA: Diagnosis not present

## 2013-10-25 DIAGNOSIS — M531 Cervicobrachial syndrome: Secondary | ICD-10-CM | POA: Diagnosis not present

## 2013-11-04 ENCOUNTER — Encounter: Payer: Self-pay | Admitting: Neurology

## 2013-11-06 DIAGNOSIS — M9903 Segmental and somatic dysfunction of lumbar region: Secondary | ICD-10-CM | POA: Diagnosis not present

## 2013-11-06 DIAGNOSIS — M531 Cervicobrachial syndrome: Secondary | ICD-10-CM | POA: Diagnosis not present

## 2013-11-06 DIAGNOSIS — M9902 Segmental and somatic dysfunction of thoracic region: Secondary | ICD-10-CM | POA: Diagnosis not present

## 2013-11-18 DIAGNOSIS — M9903 Segmental and somatic dysfunction of lumbar region: Secondary | ICD-10-CM | POA: Diagnosis not present

## 2013-11-18 DIAGNOSIS — M531 Cervicobrachial syndrome: Secondary | ICD-10-CM | POA: Diagnosis not present

## 2013-11-18 DIAGNOSIS — M9902 Segmental and somatic dysfunction of thoracic region: Secondary | ICD-10-CM | POA: Diagnosis not present

## 2013-11-25 DIAGNOSIS — Z01419 Encounter for gynecological examination (general) (routine) without abnormal findings: Secondary | ICD-10-CM | POA: Diagnosis not present

## 2013-11-25 DIAGNOSIS — N879 Dysplasia of cervix uteri, unspecified: Secondary | ICD-10-CM | POA: Diagnosis not present

## 2013-11-25 DIAGNOSIS — Z1231 Encounter for screening mammogram for malignant neoplasm of breast: Secondary | ICD-10-CM | POA: Diagnosis not present

## 2013-11-26 DIAGNOSIS — M9903 Segmental and somatic dysfunction of lumbar region: Secondary | ICD-10-CM | POA: Diagnosis not present

## 2013-11-26 DIAGNOSIS — M531 Cervicobrachial syndrome: Secondary | ICD-10-CM | POA: Diagnosis not present

## 2013-11-26 DIAGNOSIS — M9902 Segmental and somatic dysfunction of thoracic region: Secondary | ICD-10-CM | POA: Diagnosis not present

## 2013-12-10 DIAGNOSIS — M9903 Segmental and somatic dysfunction of lumbar region: Secondary | ICD-10-CM | POA: Diagnosis not present

## 2013-12-10 DIAGNOSIS — M531 Cervicobrachial syndrome: Secondary | ICD-10-CM | POA: Diagnosis not present

## 2013-12-10 DIAGNOSIS — M9902 Segmental and somatic dysfunction of thoracic region: Secondary | ICD-10-CM | POA: Diagnosis not present

## 2013-12-25 DIAGNOSIS — M531 Cervicobrachial syndrome: Secondary | ICD-10-CM | POA: Diagnosis not present

## 2013-12-25 DIAGNOSIS — M9903 Segmental and somatic dysfunction of lumbar region: Secondary | ICD-10-CM | POA: Diagnosis not present

## 2013-12-25 DIAGNOSIS — M9902 Segmental and somatic dysfunction of thoracic region: Secondary | ICD-10-CM | POA: Diagnosis not present

## 2014-01-21 DIAGNOSIS — M531 Cervicobrachial syndrome: Secondary | ICD-10-CM | POA: Diagnosis not present

## 2014-01-21 DIAGNOSIS — M9902 Segmental and somatic dysfunction of thoracic region: Secondary | ICD-10-CM | POA: Diagnosis not present

## 2014-01-21 DIAGNOSIS — M9903 Segmental and somatic dysfunction of lumbar region: Secondary | ICD-10-CM | POA: Diagnosis not present

## 2014-02-04 DIAGNOSIS — M531 Cervicobrachial syndrome: Secondary | ICD-10-CM | POA: Diagnosis not present

## 2014-02-04 DIAGNOSIS — M9902 Segmental and somatic dysfunction of thoracic region: Secondary | ICD-10-CM | POA: Diagnosis not present

## 2014-02-04 DIAGNOSIS — M9903 Segmental and somatic dysfunction of lumbar region: Secondary | ICD-10-CM | POA: Diagnosis not present

## 2014-02-06 DIAGNOSIS — Z6831 Body mass index (BMI) 31.0-31.9, adult: Secondary | ICD-10-CM | POA: Diagnosis not present

## 2014-02-06 DIAGNOSIS — R000 Tachycardia, unspecified: Secondary | ICD-10-CM | POA: Diagnosis not present

## 2014-02-06 DIAGNOSIS — E785 Hyperlipidemia, unspecified: Secondary | ICD-10-CM | POA: Diagnosis not present

## 2014-02-06 DIAGNOSIS — I10 Essential (primary) hypertension: Secondary | ICD-10-CM | POA: Diagnosis not present

## 2014-02-06 DIAGNOSIS — G4700 Insomnia, unspecified: Secondary | ICD-10-CM | POA: Diagnosis not present

## 2014-02-06 DIAGNOSIS — G4733 Obstructive sleep apnea (adult) (pediatric): Secondary | ICD-10-CM | POA: Diagnosis not present

## 2014-02-12 DIAGNOSIS — M9903 Segmental and somatic dysfunction of lumbar region: Secondary | ICD-10-CM | POA: Diagnosis not present

## 2014-02-12 DIAGNOSIS — M9904 Segmental and somatic dysfunction of sacral region: Secondary | ICD-10-CM | POA: Diagnosis not present

## 2014-02-12 DIAGNOSIS — M9902 Segmental and somatic dysfunction of thoracic region: Secondary | ICD-10-CM | POA: Diagnosis not present

## 2014-04-15 DIAGNOSIS — M9904 Segmental and somatic dysfunction of sacral region: Secondary | ICD-10-CM | POA: Diagnosis not present

## 2014-04-15 DIAGNOSIS — M9902 Segmental and somatic dysfunction of thoracic region: Secondary | ICD-10-CM | POA: Diagnosis not present

## 2014-04-15 DIAGNOSIS — M9903 Segmental and somatic dysfunction of lumbar region: Secondary | ICD-10-CM | POA: Diagnosis not present

## 2014-05-15 DIAGNOSIS — G4700 Insomnia, unspecified: Secondary | ICD-10-CM | POA: Diagnosis not present

## 2014-05-15 DIAGNOSIS — E784 Other hyperlipidemia: Secondary | ICD-10-CM | POA: Diagnosis not present

## 2014-05-15 DIAGNOSIS — Z6831 Body mass index (BMI) 31.0-31.9, adult: Secondary | ICD-10-CM | POA: Diagnosis not present

## 2014-05-15 DIAGNOSIS — Z1389 Encounter for screening for other disorder: Secondary | ICD-10-CM | POA: Diagnosis not present

## 2014-05-15 DIAGNOSIS — I10 Essential (primary) hypertension: Secondary | ICD-10-CM | POA: Diagnosis not present

## 2014-05-23 DIAGNOSIS — Z0000 Encounter for general adult medical examination without abnormal findings: Secondary | ICD-10-CM | POA: Diagnosis not present

## 2014-06-12 DIAGNOSIS — Z6831 Body mass index (BMI) 31.0-31.9, adult: Secondary | ICD-10-CM | POA: Diagnosis not present

## 2014-06-12 DIAGNOSIS — S1096XA Insect bite of unspecified part of neck, initial encounter: Secondary | ICD-10-CM | POA: Diagnosis not present

## 2014-06-26 IMAGING — MG MM DIGITAL SCREENING BILAT
4 series · 4 of 4 positions shown · non-contrast
Comparison: None.

***ADDENDUM*** CREATED: 03/29/2012 [DATE]

Comparison is made with bilateral mammogram December 04, 2003
***END ADDENDUM*** SIGNED BY: Lesmore Mauck, M.D.
CLINICAL DATA: Screening.
DIGITAL BILATERAL SCREENING MAMMOGRAM WITH CAD

[R CC]
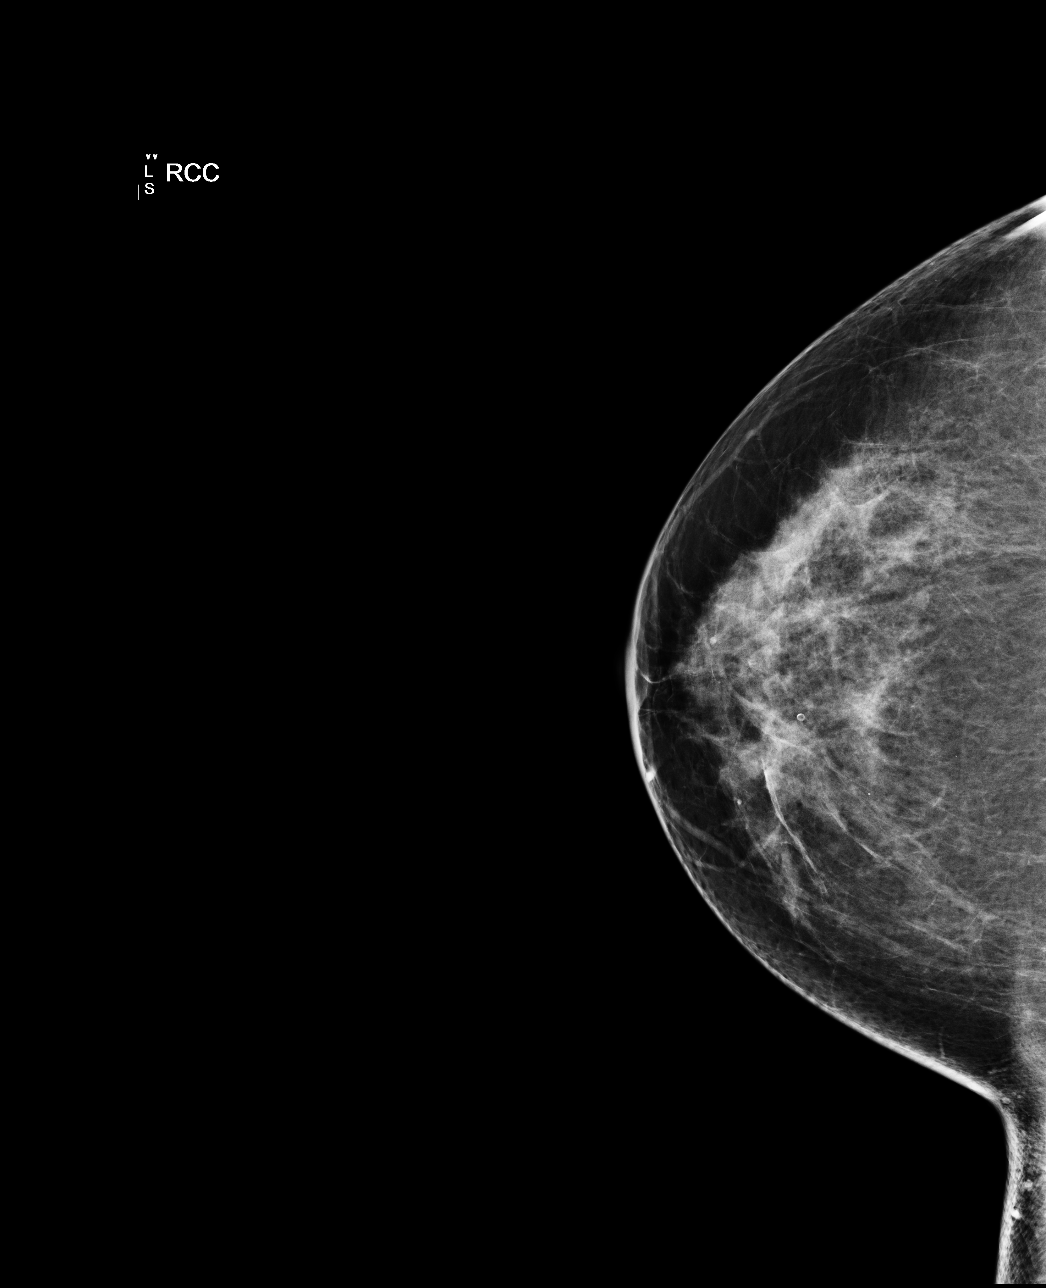

[R MLO]
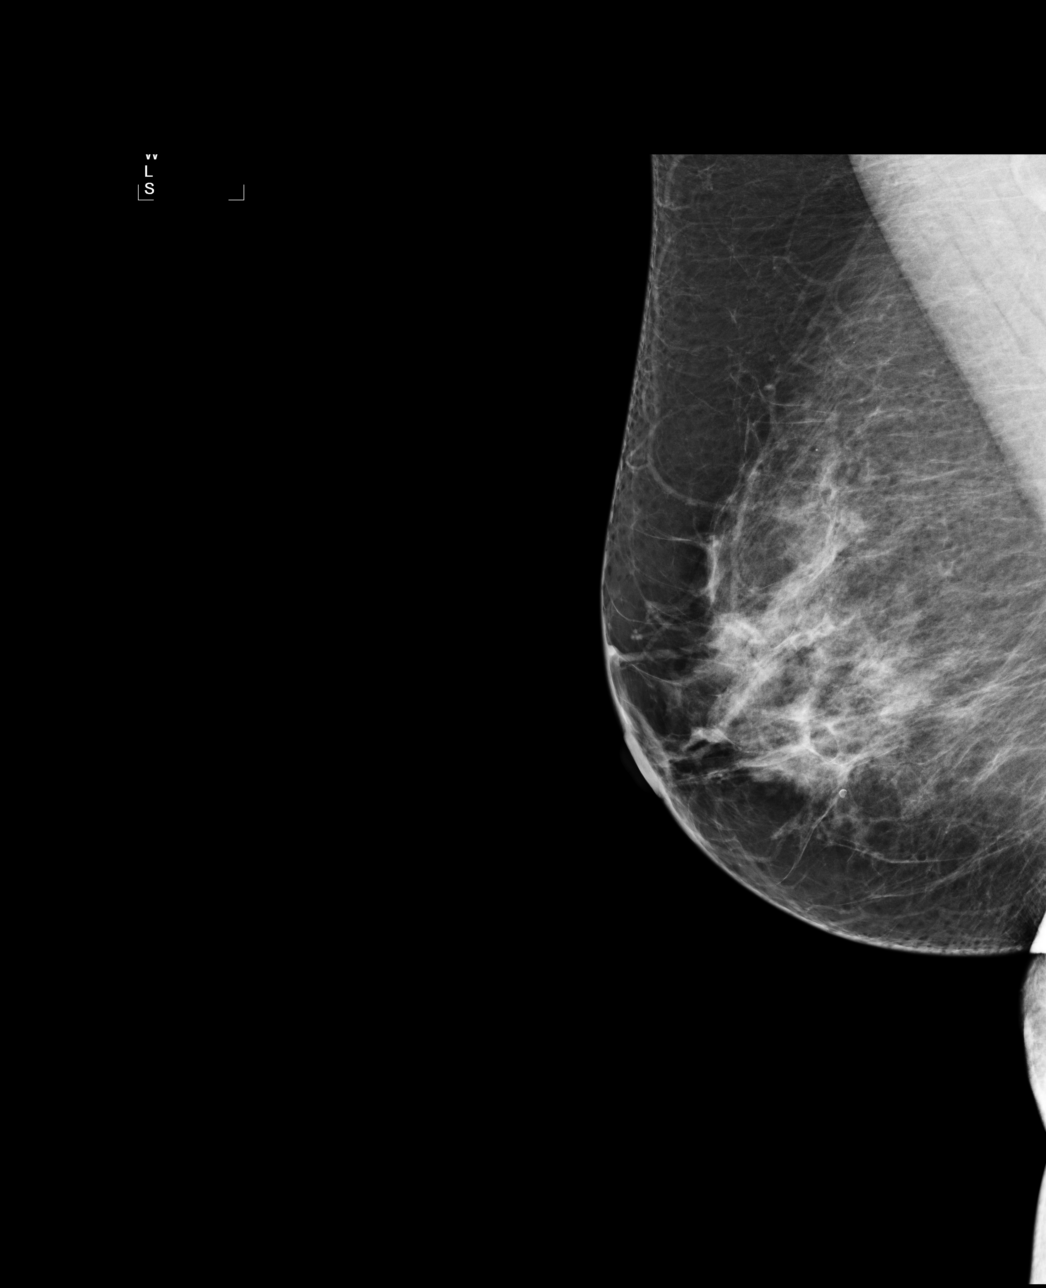

[L CC]
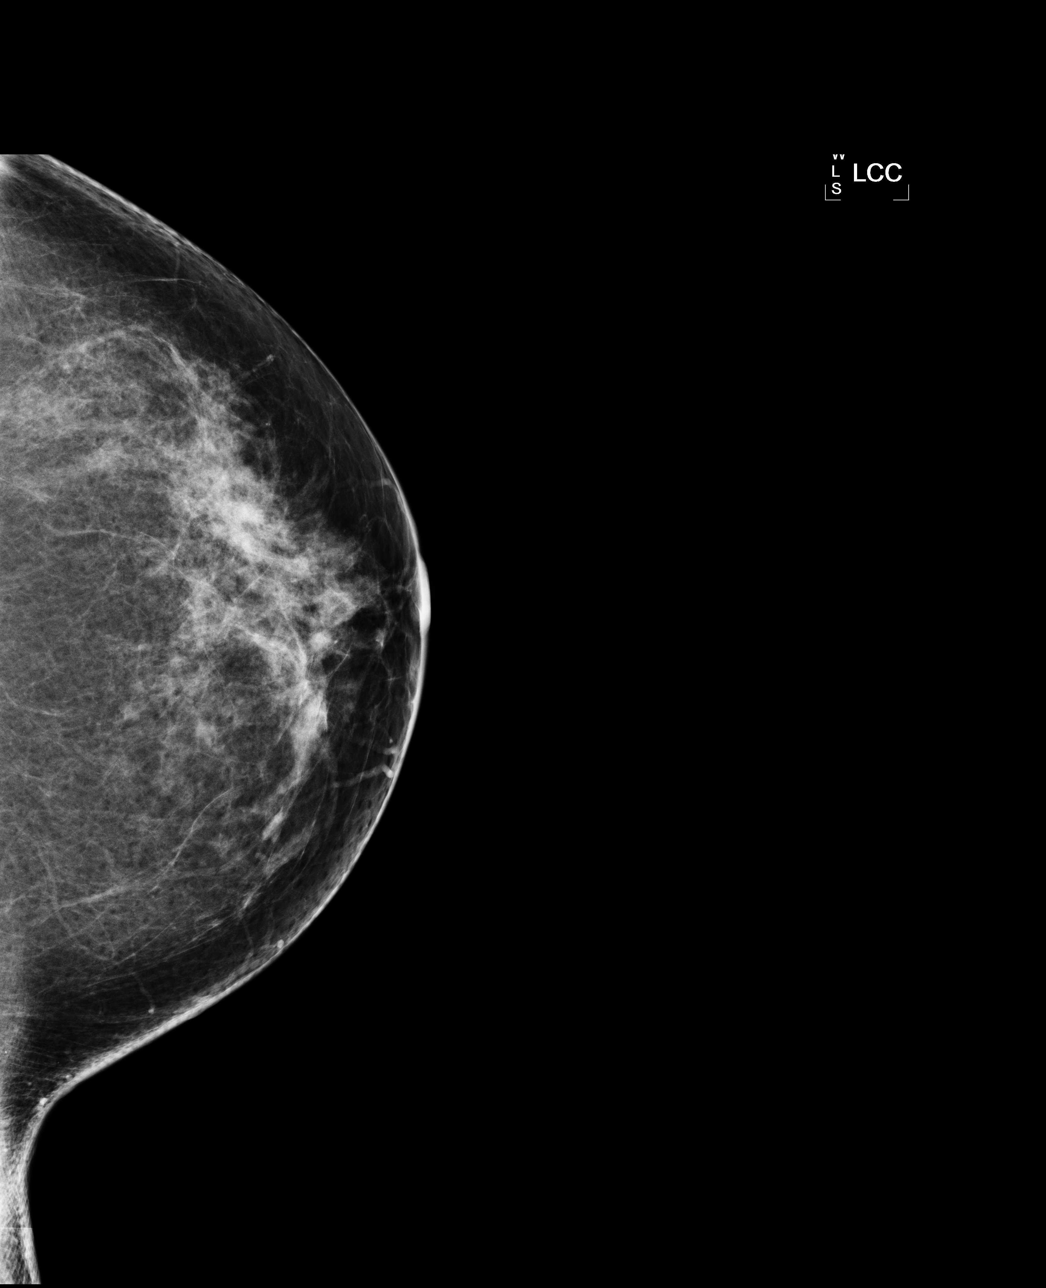

[L MLO]
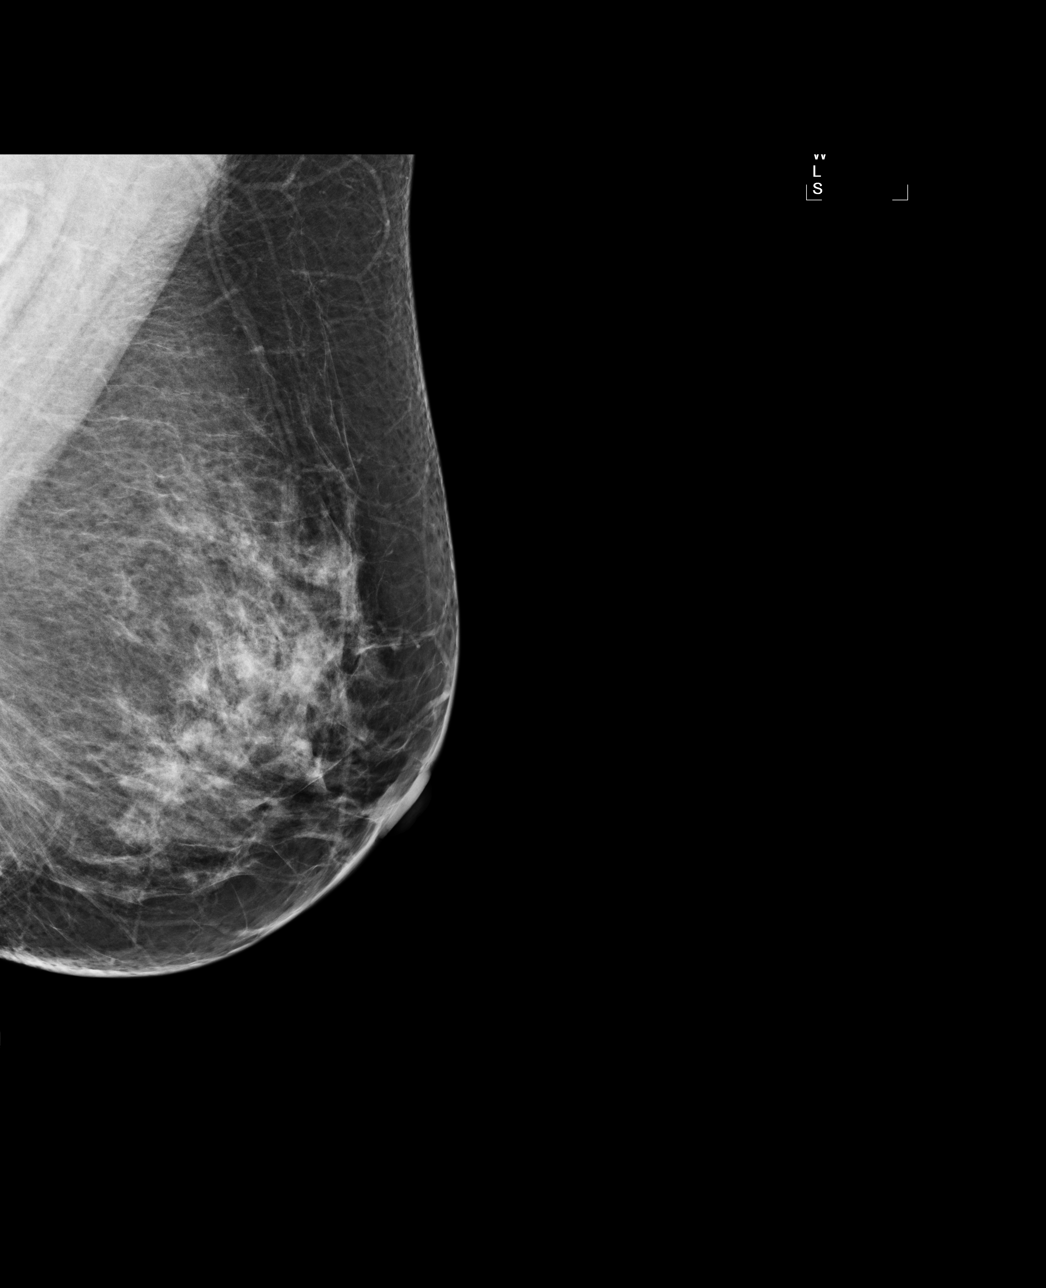

[4 of 4 positions shown; findings below may reference images not displayed]

FINDINGS: ACR Breast Density Category 3: The breast tissue is heterogeneously
dense.

No suspicious masses, architectural distortion, or calcifications
are present.

Images were processed with CAD.
IMPRESSION: No mammographic evidence of malignancy.

A result letter of this screening mammogram will be mailed directly
to the patient.

RECOMMENATION:
Screening mammogram in one year. (Code:K4-I-SH0)

BI-RADS CATEGORY 1:  Negative.

## 2014-07-15 DIAGNOSIS — M9902 Segmental and somatic dysfunction of thoracic region: Secondary | ICD-10-CM | POA: Diagnosis not present

## 2014-07-15 DIAGNOSIS — M9904 Segmental and somatic dysfunction of sacral region: Secondary | ICD-10-CM | POA: Diagnosis not present

## 2014-07-15 DIAGNOSIS — M9903 Segmental and somatic dysfunction of lumbar region: Secondary | ICD-10-CM | POA: Diagnosis not present

## 2014-07-24 ENCOUNTER — Other Ambulatory Visit: Payer: Self-pay | Admitting: Obstetrics and Gynecology

## 2014-07-24 DIAGNOSIS — N900 Mild vulvar dysplasia: Secondary | ICD-10-CM | POA: Diagnosis not present

## 2014-07-24 DIAGNOSIS — Z1231 Encounter for screening mammogram for malignant neoplasm of breast: Secondary | ICD-10-CM | POA: Diagnosis not present

## 2014-07-24 DIAGNOSIS — Z779 Other contact with and (suspected) exposures hazardous to health: Secondary | ICD-10-CM | POA: Diagnosis not present

## 2014-07-24 DIAGNOSIS — Z6832 Body mass index (BMI) 32.0-32.9, adult: Secondary | ICD-10-CM | POA: Diagnosis not present

## 2014-07-25 LAB — CYTOLOGY - PAP

## 2014-08-05 DIAGNOSIS — H26493 Other secondary cataract, bilateral: Secondary | ICD-10-CM | POA: Diagnosis not present

## 2014-08-08 DIAGNOSIS — M9902 Segmental and somatic dysfunction of thoracic region: Secondary | ICD-10-CM | POA: Diagnosis not present

## 2014-08-08 DIAGNOSIS — M9904 Segmental and somatic dysfunction of sacral region: Secondary | ICD-10-CM | POA: Diagnosis not present

## 2014-08-08 DIAGNOSIS — M9903 Segmental and somatic dysfunction of lumbar region: Secondary | ICD-10-CM | POA: Diagnosis not present

## 2014-08-21 DIAGNOSIS — I10 Essential (primary) hypertension: Secondary | ICD-10-CM | POA: Diagnosis not present

## 2014-08-21 DIAGNOSIS — G4700 Insomnia, unspecified: Secondary | ICD-10-CM | POA: Diagnosis not present

## 2014-08-21 DIAGNOSIS — G4733 Obstructive sleep apnea (adult) (pediatric): Secondary | ICD-10-CM | POA: Diagnosis not present

## 2014-08-21 DIAGNOSIS — Z6831 Body mass index (BMI) 31.0-31.9, adult: Secondary | ICD-10-CM | POA: Diagnosis not present

## 2014-08-21 DIAGNOSIS — E784 Other hyperlipidemia: Secondary | ICD-10-CM | POA: Diagnosis not present

## 2014-08-21 DIAGNOSIS — J309 Allergic rhinitis, unspecified: Secondary | ICD-10-CM | POA: Diagnosis not present

## 2014-08-22 DIAGNOSIS — M9902 Segmental and somatic dysfunction of thoracic region: Secondary | ICD-10-CM | POA: Diagnosis not present

## 2014-08-22 DIAGNOSIS — M9903 Segmental and somatic dysfunction of lumbar region: Secondary | ICD-10-CM | POA: Diagnosis not present

## 2014-08-22 DIAGNOSIS — M9904 Segmental and somatic dysfunction of sacral region: Secondary | ICD-10-CM | POA: Diagnosis not present

## 2014-08-25 DIAGNOSIS — Z6831 Body mass index (BMI) 31.0-31.9, adult: Secondary | ICD-10-CM | POA: Diagnosis not present

## 2014-08-25 DIAGNOSIS — I10 Essential (primary) hypertension: Secondary | ICD-10-CM | POA: Diagnosis not present

## 2014-09-01 DIAGNOSIS — I10 Essential (primary) hypertension: Secondary | ICD-10-CM | POA: Diagnosis not present

## 2014-09-01 DIAGNOSIS — G4700 Insomnia, unspecified: Secondary | ICD-10-CM | POA: Diagnosis not present

## 2014-09-01 DIAGNOSIS — Z6831 Body mass index (BMI) 31.0-31.9, adult: Secondary | ICD-10-CM | POA: Diagnosis not present

## 2014-09-01 DIAGNOSIS — Z9181 History of falling: Secondary | ICD-10-CM | POA: Diagnosis not present

## 2014-09-01 DIAGNOSIS — Z1389 Encounter for screening for other disorder: Secondary | ICD-10-CM | POA: Diagnosis not present

## 2014-09-22 DIAGNOSIS — Z6831 Body mass index (BMI) 31.0-31.9, adult: Secondary | ICD-10-CM | POA: Diagnosis not present

## 2014-09-22 DIAGNOSIS — G4700 Insomnia, unspecified: Secondary | ICD-10-CM | POA: Diagnosis not present

## 2014-09-22 DIAGNOSIS — I10 Essential (primary) hypertension: Secondary | ICD-10-CM | POA: Diagnosis not present

## 2014-09-22 DIAGNOSIS — F411 Generalized anxiety disorder: Secondary | ICD-10-CM | POA: Diagnosis not present

## 2014-11-05 DIAGNOSIS — M9902 Segmental and somatic dysfunction of thoracic region: Secondary | ICD-10-CM | POA: Diagnosis not present

## 2014-11-05 DIAGNOSIS — M9900 Segmental and somatic dysfunction of head region: Secondary | ICD-10-CM | POA: Diagnosis not present

## 2014-11-05 DIAGNOSIS — M9903 Segmental and somatic dysfunction of lumbar region: Secondary | ICD-10-CM | POA: Diagnosis not present

## 2014-11-05 DIAGNOSIS — G441 Vascular headache, not elsewhere classified: Secondary | ICD-10-CM | POA: Diagnosis not present

## 2014-11-17 DIAGNOSIS — M9902 Segmental and somatic dysfunction of thoracic region: Secondary | ICD-10-CM | POA: Diagnosis not present

## 2014-11-17 DIAGNOSIS — G441 Vascular headache, not elsewhere classified: Secondary | ICD-10-CM | POA: Diagnosis not present

## 2014-11-17 DIAGNOSIS — M9903 Segmental and somatic dysfunction of lumbar region: Secondary | ICD-10-CM | POA: Diagnosis not present

## 2014-11-17 DIAGNOSIS — M9900 Segmental and somatic dysfunction of head region: Secondary | ICD-10-CM | POA: Diagnosis not present

## 2014-11-24 DIAGNOSIS — I10 Essential (primary) hypertension: Secondary | ICD-10-CM | POA: Diagnosis not present

## 2014-11-24 DIAGNOSIS — G4733 Obstructive sleep apnea (adult) (pediatric): Secondary | ICD-10-CM | POA: Diagnosis not present

## 2014-11-24 DIAGNOSIS — Z6831 Body mass index (BMI) 31.0-31.9, adult: Secondary | ICD-10-CM | POA: Diagnosis not present

## 2014-11-24 DIAGNOSIS — F411 Generalized anxiety disorder: Secondary | ICD-10-CM | POA: Diagnosis not present

## 2014-11-24 DIAGNOSIS — G4700 Insomnia, unspecified: Secondary | ICD-10-CM | POA: Diagnosis not present

## 2014-11-24 DIAGNOSIS — E784 Other hyperlipidemia: Secondary | ICD-10-CM | POA: Diagnosis not present

## 2014-11-25 DIAGNOSIS — M9903 Segmental and somatic dysfunction of lumbar region: Secondary | ICD-10-CM | POA: Diagnosis not present

## 2014-11-25 DIAGNOSIS — M9900 Segmental and somatic dysfunction of head region: Secondary | ICD-10-CM | POA: Diagnosis not present

## 2014-11-25 DIAGNOSIS — G441 Vascular headache, not elsewhere classified: Secondary | ICD-10-CM | POA: Diagnosis not present

## 2014-11-25 DIAGNOSIS — M9902 Segmental and somatic dysfunction of thoracic region: Secondary | ICD-10-CM | POA: Diagnosis not present

## 2014-12-08 DIAGNOSIS — M25552 Pain in left hip: Secondary | ICD-10-CM | POA: Diagnosis not present

## 2014-12-08 DIAGNOSIS — M545 Low back pain: Secondary | ICD-10-CM | POA: Diagnosis not present

## 2014-12-08 DIAGNOSIS — M50321 Other cervical disc degeneration at C4-C5 level: Secondary | ICD-10-CM | POA: Diagnosis not present

## 2014-12-08 DIAGNOSIS — M5136 Other intervertebral disc degeneration, lumbar region: Secondary | ICD-10-CM | POA: Diagnosis not present

## 2014-12-08 DIAGNOSIS — M542 Cervicalgia: Secondary | ICD-10-CM | POA: Diagnosis not present

## 2014-12-08 DIAGNOSIS — M5137 Other intervertebral disc degeneration, lumbosacral region: Secondary | ICD-10-CM | POA: Diagnosis not present

## 2014-12-08 DIAGNOSIS — M25551 Pain in right hip: Secondary | ICD-10-CM | POA: Diagnosis not present

## 2014-12-08 DIAGNOSIS — M419 Scoliosis, unspecified: Secondary | ICD-10-CM | POA: Diagnosis not present

## 2014-12-08 DIAGNOSIS — M50322 Other cervical disc degeneration at C5-C6 level: Secondary | ICD-10-CM | POA: Diagnosis not present

## 2014-12-08 DIAGNOSIS — M461 Sacroiliitis, not elsewhere classified: Secondary | ICD-10-CM | POA: Diagnosis not present

## 2014-12-08 DIAGNOSIS — M546 Pain in thoracic spine: Secondary | ICD-10-CM | POA: Diagnosis not present

## 2014-12-08 DIAGNOSIS — M47812 Spondylosis without myelopathy or radiculopathy, cervical region: Secondary | ICD-10-CM | POA: Diagnosis not present

## 2014-12-11 DIAGNOSIS — M4606 Spinal enthesopathy, lumbar region: Secondary | ICD-10-CM | POA: Diagnosis not present

## 2014-12-11 DIAGNOSIS — M9901 Segmental and somatic dysfunction of cervical region: Secondary | ICD-10-CM | POA: Diagnosis not present

## 2014-12-11 DIAGNOSIS — M791 Myalgia: Secondary | ICD-10-CM | POA: Diagnosis not present

## 2014-12-11 DIAGNOSIS — M9903 Segmental and somatic dysfunction of lumbar region: Secondary | ICD-10-CM | POA: Diagnosis not present

## 2014-12-11 DIAGNOSIS — M5033 Other cervical disc degeneration, cervicothoracic region: Secondary | ICD-10-CM | POA: Diagnosis not present

## 2014-12-11 DIAGNOSIS — M5136 Other intervertebral disc degeneration, lumbar region: Secondary | ICD-10-CM | POA: Diagnosis not present

## 2014-12-11 DIAGNOSIS — M545 Low back pain: Secondary | ICD-10-CM | POA: Diagnosis not present

## 2014-12-16 DIAGNOSIS — M9903 Segmental and somatic dysfunction of lumbar region: Secondary | ICD-10-CM | POA: Diagnosis not present

## 2014-12-16 DIAGNOSIS — M9902 Segmental and somatic dysfunction of thoracic region: Secondary | ICD-10-CM | POA: Diagnosis not present

## 2014-12-16 DIAGNOSIS — M9900 Segmental and somatic dysfunction of head region: Secondary | ICD-10-CM | POA: Diagnosis not present

## 2014-12-16 DIAGNOSIS — G441 Vascular headache, not elsewhere classified: Secondary | ICD-10-CM | POA: Diagnosis not present

## 2014-12-19 DIAGNOSIS — M5417 Radiculopathy, lumbosacral region: Secondary | ICD-10-CM | POA: Diagnosis not present

## 2014-12-19 DIAGNOSIS — M461 Sacroiliitis, not elsewhere classified: Secondary | ICD-10-CM | POA: Diagnosis not present

## 2014-12-19 DIAGNOSIS — M542 Cervicalgia: Secondary | ICD-10-CM | POA: Diagnosis not present

## 2014-12-19 DIAGNOSIS — M25551 Pain in right hip: Secondary | ICD-10-CM | POA: Diagnosis not present

## 2014-12-19 DIAGNOSIS — M545 Low back pain: Secondary | ICD-10-CM | POA: Diagnosis not present

## 2014-12-19 DIAGNOSIS — M5136 Other intervertebral disc degeneration, lumbar region: Secondary | ICD-10-CM | POA: Diagnosis not present

## 2014-12-19 DIAGNOSIS — M791 Myalgia: Secondary | ICD-10-CM | POA: Diagnosis not present

## 2014-12-19 DIAGNOSIS — M9903 Segmental and somatic dysfunction of lumbar region: Secondary | ICD-10-CM | POA: Diagnosis not present

## 2014-12-19 DIAGNOSIS — M5033 Other cervical disc degeneration, cervicothoracic region: Secondary | ICD-10-CM | POA: Diagnosis not present

## 2014-12-19 DIAGNOSIS — M9901 Segmental and somatic dysfunction of cervical region: Secondary | ICD-10-CM | POA: Diagnosis not present

## 2014-12-22 DIAGNOSIS — M545 Low back pain: Secondary | ICD-10-CM | POA: Diagnosis not present

## 2014-12-22 DIAGNOSIS — M9903 Segmental and somatic dysfunction of lumbar region: Secondary | ICD-10-CM | POA: Diagnosis not present

## 2014-12-22 DIAGNOSIS — M542 Cervicalgia: Secondary | ICD-10-CM | POA: Diagnosis not present

## 2014-12-22 DIAGNOSIS — M5033 Other cervical disc degeneration, cervicothoracic region: Secondary | ICD-10-CM | POA: Diagnosis not present

## 2014-12-22 DIAGNOSIS — M4606 Spinal enthesopathy, lumbar region: Secondary | ICD-10-CM | POA: Diagnosis not present

## 2014-12-22 DIAGNOSIS — M9901 Segmental and somatic dysfunction of cervical region: Secondary | ICD-10-CM | POA: Diagnosis not present

## 2014-12-22 DIAGNOSIS — M791 Myalgia: Secondary | ICD-10-CM | POA: Diagnosis not present

## 2014-12-22 DIAGNOSIS — M461 Sacroiliitis, not elsewhere classified: Secondary | ICD-10-CM | POA: Diagnosis not present

## 2014-12-22 DIAGNOSIS — M5417 Radiculopathy, lumbosacral region: Secondary | ICD-10-CM | POA: Diagnosis not present

## 2014-12-22 DIAGNOSIS — M5136 Other intervertebral disc degeneration, lumbar region: Secondary | ICD-10-CM | POA: Diagnosis not present

## 2014-12-25 DIAGNOSIS — M542 Cervicalgia: Secondary | ICD-10-CM | POA: Diagnosis not present

## 2014-12-25 DIAGNOSIS — M5417 Radiculopathy, lumbosacral region: Secondary | ICD-10-CM | POA: Diagnosis not present

## 2014-12-25 DIAGNOSIS — M4606 Spinal enthesopathy, lumbar region: Secondary | ICD-10-CM | POA: Diagnosis not present

## 2014-12-25 DIAGNOSIS — M9903 Segmental and somatic dysfunction of lumbar region: Secondary | ICD-10-CM | POA: Diagnosis not present

## 2014-12-25 DIAGNOSIS — M5136 Other intervertebral disc degeneration, lumbar region: Secondary | ICD-10-CM | POA: Diagnosis not present

## 2014-12-25 DIAGNOSIS — M5033 Other cervical disc degeneration, cervicothoracic region: Secondary | ICD-10-CM | POA: Diagnosis not present

## 2014-12-25 DIAGNOSIS — M545 Low back pain: Secondary | ICD-10-CM | POA: Diagnosis not present

## 2014-12-25 DIAGNOSIS — M791 Myalgia: Secondary | ICD-10-CM | POA: Diagnosis not present

## 2014-12-25 DIAGNOSIS — M461 Sacroiliitis, not elsewhere classified: Secondary | ICD-10-CM | POA: Diagnosis not present

## 2014-12-25 DIAGNOSIS — M9901 Segmental and somatic dysfunction of cervical region: Secondary | ICD-10-CM | POA: Diagnosis not present

## 2014-12-30 DIAGNOSIS — M461 Sacroiliitis, not elsewhere classified: Secondary | ICD-10-CM | POA: Diagnosis not present

## 2014-12-30 DIAGNOSIS — M5136 Other intervertebral disc degeneration, lumbar region: Secondary | ICD-10-CM | POA: Diagnosis not present

## 2014-12-30 DIAGNOSIS — M545 Low back pain: Secondary | ICD-10-CM | POA: Diagnosis not present

## 2014-12-30 DIAGNOSIS — M9903 Segmental and somatic dysfunction of lumbar region: Secondary | ICD-10-CM | POA: Diagnosis not present

## 2014-12-30 DIAGNOSIS — M25551 Pain in right hip: Secondary | ICD-10-CM | POA: Diagnosis not present

## 2014-12-30 DIAGNOSIS — M542 Cervicalgia: Secondary | ICD-10-CM | POA: Diagnosis not present

## 2014-12-30 DIAGNOSIS — M9901 Segmental and somatic dysfunction of cervical region: Secondary | ICD-10-CM | POA: Diagnosis not present

## 2014-12-30 DIAGNOSIS — M5417 Radiculopathy, lumbosacral region: Secondary | ICD-10-CM | POA: Diagnosis not present

## 2014-12-30 DIAGNOSIS — M5033 Other cervical disc degeneration, cervicothoracic region: Secondary | ICD-10-CM | POA: Diagnosis not present

## 2015-01-05 DIAGNOSIS — M5033 Other cervical disc degeneration, cervicothoracic region: Secondary | ICD-10-CM | POA: Diagnosis not present

## 2015-01-05 DIAGNOSIS — M25551 Pain in right hip: Secondary | ICD-10-CM | POA: Diagnosis not present

## 2015-01-05 DIAGNOSIS — M5417 Radiculopathy, lumbosacral region: Secondary | ICD-10-CM | POA: Diagnosis not present

## 2015-01-05 DIAGNOSIS — M461 Sacroiliitis, not elsewhere classified: Secondary | ICD-10-CM | POA: Diagnosis not present

## 2015-01-05 DIAGNOSIS — M545 Low back pain: Secondary | ICD-10-CM | POA: Diagnosis not present

## 2015-01-05 DIAGNOSIS — M4606 Spinal enthesopathy, lumbar region: Secondary | ICD-10-CM | POA: Diagnosis not present

## 2015-01-05 DIAGNOSIS — M791 Myalgia: Secondary | ICD-10-CM | POA: Diagnosis not present

## 2015-01-05 DIAGNOSIS — M5136 Other intervertebral disc degeneration, lumbar region: Secondary | ICD-10-CM | POA: Diagnosis not present

## 2015-01-05 DIAGNOSIS — M542 Cervicalgia: Secondary | ICD-10-CM | POA: Diagnosis not present

## 2015-01-05 DIAGNOSIS — M9903 Segmental and somatic dysfunction of lumbar region: Secondary | ICD-10-CM | POA: Diagnosis not present

## 2015-01-05 DIAGNOSIS — M9901 Segmental and somatic dysfunction of cervical region: Secondary | ICD-10-CM | POA: Diagnosis not present

## 2015-01-08 DIAGNOSIS — M9901 Segmental and somatic dysfunction of cervical region: Secondary | ICD-10-CM | POA: Diagnosis not present

## 2015-01-08 DIAGNOSIS — M545 Low back pain: Secondary | ICD-10-CM | POA: Diagnosis not present

## 2015-01-08 DIAGNOSIS — M461 Sacroiliitis, not elsewhere classified: Secondary | ICD-10-CM | POA: Diagnosis not present

## 2015-01-08 DIAGNOSIS — M5417 Radiculopathy, lumbosacral region: Secondary | ICD-10-CM | POA: Diagnosis not present

## 2015-01-08 DIAGNOSIS — M5136 Other intervertebral disc degeneration, lumbar region: Secondary | ICD-10-CM | POA: Diagnosis not present

## 2015-01-08 DIAGNOSIS — M542 Cervicalgia: Secondary | ICD-10-CM | POA: Diagnosis not present

## 2015-01-08 DIAGNOSIS — M9903 Segmental and somatic dysfunction of lumbar region: Secondary | ICD-10-CM | POA: Diagnosis not present

## 2015-01-08 DIAGNOSIS — M5033 Other cervical disc degeneration, cervicothoracic region: Secondary | ICD-10-CM | POA: Diagnosis not present

## 2015-01-16 DIAGNOSIS — J019 Acute sinusitis, unspecified: Secondary | ICD-10-CM | POA: Diagnosis not present

## 2015-01-16 DIAGNOSIS — Z6832 Body mass index (BMI) 32.0-32.9, adult: Secondary | ICD-10-CM | POA: Diagnosis not present

## 2015-01-16 DIAGNOSIS — E669 Obesity, unspecified: Secondary | ICD-10-CM | POA: Diagnosis not present

## 2015-01-19 DIAGNOSIS — M9903 Segmental and somatic dysfunction of lumbar region: Secondary | ICD-10-CM | POA: Diagnosis not present

## 2015-01-19 DIAGNOSIS — M4606 Spinal enthesopathy, lumbar region: Secondary | ICD-10-CM | POA: Diagnosis not present

## 2015-01-19 DIAGNOSIS — M5417 Radiculopathy, lumbosacral region: Secondary | ICD-10-CM | POA: Diagnosis not present

## 2015-01-19 DIAGNOSIS — M9901 Segmental and somatic dysfunction of cervical region: Secondary | ICD-10-CM | POA: Diagnosis not present

## 2015-01-19 DIAGNOSIS — M461 Sacroiliitis, not elsewhere classified: Secondary | ICD-10-CM | POA: Diagnosis not present

## 2015-01-19 DIAGNOSIS — M545 Low back pain: Secondary | ICD-10-CM | POA: Diagnosis not present

## 2015-01-19 DIAGNOSIS — M791 Myalgia: Secondary | ICD-10-CM | POA: Diagnosis not present

## 2015-01-19 DIAGNOSIS — M5033 Other cervical disc degeneration, cervicothoracic region: Secondary | ICD-10-CM | POA: Diagnosis not present

## 2015-01-19 DIAGNOSIS — M542 Cervicalgia: Secondary | ICD-10-CM | POA: Diagnosis not present

## 2015-01-19 DIAGNOSIS — M5136 Other intervertebral disc degeneration, lumbar region: Secondary | ICD-10-CM | POA: Diagnosis not present

## 2015-01-22 DIAGNOSIS — M5033 Other cervical disc degeneration, cervicothoracic region: Secondary | ICD-10-CM | POA: Diagnosis not present

## 2015-01-22 DIAGNOSIS — M4606 Spinal enthesopathy, lumbar region: Secondary | ICD-10-CM | POA: Diagnosis not present

## 2015-01-22 DIAGNOSIS — M5417 Radiculopathy, lumbosacral region: Secondary | ICD-10-CM | POA: Diagnosis not present

## 2015-01-22 DIAGNOSIS — M9901 Segmental and somatic dysfunction of cervical region: Secondary | ICD-10-CM | POA: Diagnosis not present

## 2015-01-22 DIAGNOSIS — M542 Cervicalgia: Secondary | ICD-10-CM | POA: Diagnosis not present

## 2015-01-22 DIAGNOSIS — M545 Low back pain: Secondary | ICD-10-CM | POA: Diagnosis not present

## 2015-01-22 DIAGNOSIS — M5136 Other intervertebral disc degeneration, lumbar region: Secondary | ICD-10-CM | POA: Diagnosis not present

## 2015-01-22 DIAGNOSIS — M461 Sacroiliitis, not elsewhere classified: Secondary | ICD-10-CM | POA: Diagnosis not present

## 2015-01-22 DIAGNOSIS — M791 Myalgia: Secondary | ICD-10-CM | POA: Diagnosis not present

## 2015-01-22 DIAGNOSIS — M9903 Segmental and somatic dysfunction of lumbar region: Secondary | ICD-10-CM | POA: Diagnosis not present

## 2015-01-26 DIAGNOSIS — M542 Cervicalgia: Secondary | ICD-10-CM | POA: Diagnosis not present

## 2015-01-26 DIAGNOSIS — M461 Sacroiliitis, not elsewhere classified: Secondary | ICD-10-CM | POA: Diagnosis not present

## 2015-01-26 DIAGNOSIS — M5417 Radiculopathy, lumbosacral region: Secondary | ICD-10-CM | POA: Diagnosis not present

## 2015-01-26 DIAGNOSIS — M5136 Other intervertebral disc degeneration, lumbar region: Secondary | ICD-10-CM | POA: Diagnosis not present

## 2015-01-26 DIAGNOSIS — M545 Low back pain: Secondary | ICD-10-CM | POA: Diagnosis not present

## 2015-01-26 DIAGNOSIS — M4606 Spinal enthesopathy, lumbar region: Secondary | ICD-10-CM | POA: Diagnosis not present

## 2015-01-26 DIAGNOSIS — M5033 Other cervical disc degeneration, cervicothoracic region: Secondary | ICD-10-CM | POA: Diagnosis not present

## 2015-01-26 DIAGNOSIS — M9901 Segmental and somatic dysfunction of cervical region: Secondary | ICD-10-CM | POA: Diagnosis not present

## 2015-01-26 DIAGNOSIS — M791 Myalgia: Secondary | ICD-10-CM | POA: Diagnosis not present

## 2015-01-26 DIAGNOSIS — M9903 Segmental and somatic dysfunction of lumbar region: Secondary | ICD-10-CM | POA: Diagnosis not present

## 2015-01-28 DIAGNOSIS — M530 Cervicocranial syndrome: Secondary | ICD-10-CM | POA: Diagnosis not present

## 2015-01-28 DIAGNOSIS — M9901 Segmental and somatic dysfunction of cervical region: Secondary | ICD-10-CM | POA: Diagnosis not present

## 2015-01-28 DIAGNOSIS — M9902 Segmental and somatic dysfunction of thoracic region: Secondary | ICD-10-CM | POA: Diagnosis not present

## 2015-01-28 DIAGNOSIS — M9900 Segmental and somatic dysfunction of head region: Secondary | ICD-10-CM | POA: Diagnosis not present

## 2015-02-02 DIAGNOSIS — M5136 Other intervertebral disc degeneration, lumbar region: Secondary | ICD-10-CM | POA: Diagnosis not present

## 2015-02-02 DIAGNOSIS — M545 Low back pain: Secondary | ICD-10-CM | POA: Diagnosis not present

## 2015-02-02 DIAGNOSIS — M5033 Other cervical disc degeneration, cervicothoracic region: Secondary | ICD-10-CM | POA: Diagnosis not present

## 2015-02-02 DIAGNOSIS — M4606 Spinal enthesopathy, lumbar region: Secondary | ICD-10-CM | POA: Diagnosis not present

## 2015-02-02 DIAGNOSIS — M791 Myalgia: Secondary | ICD-10-CM | POA: Diagnosis not present

## 2015-02-02 DIAGNOSIS — M9901 Segmental and somatic dysfunction of cervical region: Secondary | ICD-10-CM | POA: Diagnosis not present

## 2015-02-02 DIAGNOSIS — M9903 Segmental and somatic dysfunction of lumbar region: Secondary | ICD-10-CM | POA: Diagnosis not present

## 2015-02-05 DIAGNOSIS — M4602 Spinal enthesopathy, cervical region: Secondary | ICD-10-CM | POA: Diagnosis not present

## 2015-02-05 DIAGNOSIS — M5136 Other intervertebral disc degeneration, lumbar region: Secondary | ICD-10-CM | POA: Diagnosis not present

## 2015-02-05 DIAGNOSIS — M9903 Segmental and somatic dysfunction of lumbar region: Secondary | ICD-10-CM | POA: Diagnosis not present

## 2015-02-05 DIAGNOSIS — M9901 Segmental and somatic dysfunction of cervical region: Secondary | ICD-10-CM | POA: Diagnosis not present

## 2015-02-05 DIAGNOSIS — M542 Cervicalgia: Secondary | ICD-10-CM | POA: Diagnosis not present

## 2015-02-05 DIAGNOSIS — M5033 Other cervical disc degeneration, cervicothoracic region: Secondary | ICD-10-CM | POA: Diagnosis not present

## 2015-02-09 DIAGNOSIS — M9903 Segmental and somatic dysfunction of lumbar region: Secondary | ICD-10-CM | POA: Diagnosis not present

## 2015-02-09 DIAGNOSIS — M5417 Radiculopathy, lumbosacral region: Secondary | ICD-10-CM | POA: Diagnosis not present

## 2015-02-09 DIAGNOSIS — M5136 Other intervertebral disc degeneration, lumbar region: Secondary | ICD-10-CM | POA: Diagnosis not present

## 2015-02-09 DIAGNOSIS — M545 Low back pain: Secondary | ICD-10-CM | POA: Diagnosis not present

## 2015-02-09 DIAGNOSIS — M9901 Segmental and somatic dysfunction of cervical region: Secondary | ICD-10-CM | POA: Diagnosis not present

## 2015-02-09 DIAGNOSIS — M461 Sacroiliitis, not elsewhere classified: Secondary | ICD-10-CM | POA: Diagnosis not present

## 2015-02-09 DIAGNOSIS — M791 Myalgia: Secondary | ICD-10-CM | POA: Diagnosis not present

## 2015-02-09 DIAGNOSIS — M5033 Other cervical disc degeneration, cervicothoracic region: Secondary | ICD-10-CM | POA: Diagnosis not present

## 2015-02-09 DIAGNOSIS — M542 Cervicalgia: Secondary | ICD-10-CM | POA: Diagnosis not present

## 2015-02-09 DIAGNOSIS — M4602 Spinal enthesopathy, cervical region: Secondary | ICD-10-CM | POA: Diagnosis not present

## 2015-02-12 DIAGNOSIS — M5417 Radiculopathy, lumbosacral region: Secondary | ICD-10-CM | POA: Diagnosis not present

## 2015-02-12 DIAGNOSIS — M9901 Segmental and somatic dysfunction of cervical region: Secondary | ICD-10-CM | POA: Diagnosis not present

## 2015-02-12 DIAGNOSIS — M791 Myalgia: Secondary | ICD-10-CM | POA: Diagnosis not present

## 2015-02-12 DIAGNOSIS — M4602 Spinal enthesopathy, cervical region: Secondary | ICD-10-CM | POA: Diagnosis not present

## 2015-02-12 DIAGNOSIS — M545 Low back pain: Secondary | ICD-10-CM | POA: Diagnosis not present

## 2015-02-12 DIAGNOSIS — M5033 Other cervical disc degeneration, cervicothoracic region: Secondary | ICD-10-CM | POA: Diagnosis not present

## 2015-02-12 DIAGNOSIS — M461 Sacroiliitis, not elsewhere classified: Secondary | ICD-10-CM | POA: Diagnosis not present

## 2015-02-12 DIAGNOSIS — M9903 Segmental and somatic dysfunction of lumbar region: Secondary | ICD-10-CM | POA: Diagnosis not present

## 2015-02-12 DIAGNOSIS — M542 Cervicalgia: Secondary | ICD-10-CM | POA: Diagnosis not present

## 2015-02-12 DIAGNOSIS — M5136 Other intervertebral disc degeneration, lumbar region: Secondary | ICD-10-CM | POA: Diagnosis not present

## 2015-02-16 DIAGNOSIS — M9901 Segmental and somatic dysfunction of cervical region: Secondary | ICD-10-CM | POA: Diagnosis not present

## 2015-02-16 DIAGNOSIS — M4602 Spinal enthesopathy, cervical region: Secondary | ICD-10-CM | POA: Diagnosis not present

## 2015-02-16 DIAGNOSIS — M542 Cervicalgia: Secondary | ICD-10-CM | POA: Diagnosis not present

## 2015-02-16 DIAGNOSIS — M9903 Segmental and somatic dysfunction of lumbar region: Secondary | ICD-10-CM | POA: Diagnosis not present

## 2015-02-16 DIAGNOSIS — M5136 Other intervertebral disc degeneration, lumbar region: Secondary | ICD-10-CM | POA: Diagnosis not present

## 2015-02-16 DIAGNOSIS — M791 Myalgia: Secondary | ICD-10-CM | POA: Diagnosis not present

## 2015-02-16 DIAGNOSIS — M5033 Other cervical disc degeneration, cervicothoracic region: Secondary | ICD-10-CM | POA: Diagnosis not present

## 2015-02-19 DIAGNOSIS — M9901 Segmental and somatic dysfunction of cervical region: Secondary | ICD-10-CM | POA: Diagnosis not present

## 2015-02-19 DIAGNOSIS — M461 Sacroiliitis, not elsewhere classified: Secondary | ICD-10-CM | POA: Diagnosis not present

## 2015-02-19 DIAGNOSIS — M545 Low back pain: Secondary | ICD-10-CM | POA: Diagnosis not present

## 2015-02-19 DIAGNOSIS — M546 Pain in thoracic spine: Secondary | ICD-10-CM | POA: Diagnosis not present

## 2015-02-19 DIAGNOSIS — M5136 Other intervertebral disc degeneration, lumbar region: Secondary | ICD-10-CM | POA: Diagnosis not present

## 2015-02-19 DIAGNOSIS — M542 Cervicalgia: Secondary | ICD-10-CM | POA: Diagnosis not present

## 2015-02-19 DIAGNOSIS — M5417 Radiculopathy, lumbosacral region: Secondary | ICD-10-CM | POA: Diagnosis not present

## 2015-02-19 DIAGNOSIS — M9903 Segmental and somatic dysfunction of lumbar region: Secondary | ICD-10-CM | POA: Diagnosis not present

## 2015-02-19 DIAGNOSIS — M5033 Other cervical disc degeneration, cervicothoracic region: Secondary | ICD-10-CM | POA: Diagnosis not present

## 2015-02-19 DIAGNOSIS — M25551 Pain in right hip: Secondary | ICD-10-CM | POA: Diagnosis not present

## 2015-02-23 DIAGNOSIS — M9903 Segmental and somatic dysfunction of lumbar region: Secondary | ICD-10-CM | POA: Diagnosis not present

## 2015-02-23 DIAGNOSIS — M542 Cervicalgia: Secondary | ICD-10-CM | POA: Diagnosis not present

## 2015-02-23 DIAGNOSIS — M7061 Trochanteric bursitis, right hip: Secondary | ICD-10-CM | POA: Diagnosis not present

## 2015-02-23 DIAGNOSIS — M461 Sacroiliitis, not elsewhere classified: Secondary | ICD-10-CM | POA: Diagnosis not present

## 2015-02-23 DIAGNOSIS — M9901 Segmental and somatic dysfunction of cervical region: Secondary | ICD-10-CM | POA: Diagnosis not present

## 2015-02-23 DIAGNOSIS — M5033 Other cervical disc degeneration, cervicothoracic region: Secondary | ICD-10-CM | POA: Diagnosis not present

## 2015-02-23 DIAGNOSIS — M5417 Radiculopathy, lumbosacral region: Secondary | ICD-10-CM | POA: Diagnosis not present

## 2015-02-23 DIAGNOSIS — M25551 Pain in right hip: Secondary | ICD-10-CM | POA: Diagnosis not present

## 2015-02-23 DIAGNOSIS — M5136 Other intervertebral disc degeneration, lumbar region: Secondary | ICD-10-CM | POA: Diagnosis not present

## 2015-02-23 DIAGNOSIS — M545 Low back pain: Secondary | ICD-10-CM | POA: Diagnosis not present

## 2015-02-26 DIAGNOSIS — G4700 Insomnia, unspecified: Secondary | ICD-10-CM | POA: Diagnosis not present

## 2015-02-26 DIAGNOSIS — M542 Cervicalgia: Secondary | ICD-10-CM | POA: Diagnosis not present

## 2015-02-26 DIAGNOSIS — M5136 Other intervertebral disc degeneration, lumbar region: Secondary | ICD-10-CM | POA: Diagnosis not present

## 2015-02-26 DIAGNOSIS — G4733 Obstructive sleep apnea (adult) (pediatric): Secondary | ICD-10-CM | POA: Diagnosis not present

## 2015-02-26 DIAGNOSIS — M5033 Other cervical disc degeneration, cervicothoracic region: Secondary | ICD-10-CM | POA: Diagnosis not present

## 2015-02-26 DIAGNOSIS — F411 Generalized anxiety disorder: Secondary | ICD-10-CM | POA: Diagnosis not present

## 2015-02-26 DIAGNOSIS — M4602 Spinal enthesopathy, cervical region: Secondary | ICD-10-CM | POA: Diagnosis not present

## 2015-02-26 DIAGNOSIS — M5417 Radiculopathy, lumbosacral region: Secondary | ICD-10-CM | POA: Diagnosis not present

## 2015-02-26 DIAGNOSIS — I10 Essential (primary) hypertension: Secondary | ICD-10-CM | POA: Diagnosis not present

## 2015-02-26 DIAGNOSIS — E784 Other hyperlipidemia: Secondary | ICD-10-CM | POA: Diagnosis not present

## 2015-02-26 DIAGNOSIS — M9903 Segmental and somatic dysfunction of lumbar region: Secondary | ICD-10-CM | POA: Diagnosis not present

## 2015-02-26 DIAGNOSIS — M461 Sacroiliitis, not elsewhere classified: Secondary | ICD-10-CM | POA: Diagnosis not present

## 2015-02-26 DIAGNOSIS — M545 Low back pain: Secondary | ICD-10-CM | POA: Diagnosis not present

## 2015-02-26 DIAGNOSIS — J019 Acute sinusitis, unspecified: Secondary | ICD-10-CM | POA: Diagnosis not present

## 2015-02-26 DIAGNOSIS — Z6831 Body mass index (BMI) 31.0-31.9, adult: Secondary | ICD-10-CM | POA: Diagnosis not present

## 2015-02-26 DIAGNOSIS — M9901 Segmental and somatic dysfunction of cervical region: Secondary | ICD-10-CM | POA: Diagnosis not present

## 2015-02-26 DIAGNOSIS — M791 Myalgia: Secondary | ICD-10-CM | POA: Diagnosis not present

## 2015-03-02 DIAGNOSIS — M9903 Segmental and somatic dysfunction of lumbar region: Secondary | ICD-10-CM | POA: Diagnosis not present

## 2015-03-02 DIAGNOSIS — M5417 Radiculopathy, lumbosacral region: Secondary | ICD-10-CM | POA: Diagnosis not present

## 2015-03-02 DIAGNOSIS — M791 Myalgia: Secondary | ICD-10-CM | POA: Diagnosis not present

## 2015-03-02 DIAGNOSIS — M542 Cervicalgia: Secondary | ICD-10-CM | POA: Diagnosis not present

## 2015-03-02 DIAGNOSIS — M9901 Segmental and somatic dysfunction of cervical region: Secondary | ICD-10-CM | POA: Diagnosis not present

## 2015-03-02 DIAGNOSIS — M545 Low back pain: Secondary | ICD-10-CM | POA: Diagnosis not present

## 2015-03-02 DIAGNOSIS — M461 Sacroiliitis, not elsewhere classified: Secondary | ICD-10-CM | POA: Diagnosis not present

## 2015-03-02 DIAGNOSIS — M4602 Spinal enthesopathy, cervical region: Secondary | ICD-10-CM | POA: Diagnosis not present

## 2015-03-02 DIAGNOSIS — M5033 Other cervical disc degeneration, cervicothoracic region: Secondary | ICD-10-CM | POA: Diagnosis not present

## 2015-03-02 DIAGNOSIS — M5136 Other intervertebral disc degeneration, lumbar region: Secondary | ICD-10-CM | POA: Diagnosis not present

## 2015-03-05 DIAGNOSIS — M5417 Radiculopathy, lumbosacral region: Secondary | ICD-10-CM | POA: Diagnosis not present

## 2015-03-05 DIAGNOSIS — M5136 Other intervertebral disc degeneration, lumbar region: Secondary | ICD-10-CM | POA: Diagnosis not present

## 2015-03-05 DIAGNOSIS — M545 Low back pain: Secondary | ICD-10-CM | POA: Diagnosis not present

## 2015-03-05 DIAGNOSIS — M461 Sacroiliitis, not elsewhere classified: Secondary | ICD-10-CM | POA: Diagnosis not present

## 2015-03-05 DIAGNOSIS — M9903 Segmental and somatic dysfunction of lumbar region: Secondary | ICD-10-CM | POA: Diagnosis not present

## 2015-03-05 DIAGNOSIS — M9901 Segmental and somatic dysfunction of cervical region: Secondary | ICD-10-CM | POA: Diagnosis not present

## 2015-03-05 DIAGNOSIS — M5033 Other cervical disc degeneration, cervicothoracic region: Secondary | ICD-10-CM | POA: Diagnosis not present

## 2015-03-05 DIAGNOSIS — M791 Myalgia: Secondary | ICD-10-CM | POA: Diagnosis not present

## 2015-03-05 DIAGNOSIS — M542 Cervicalgia: Secondary | ICD-10-CM | POA: Diagnosis not present

## 2015-03-05 DIAGNOSIS — M4602 Spinal enthesopathy, cervical region: Secondary | ICD-10-CM | POA: Diagnosis not present

## 2015-03-09 DIAGNOSIS — M9901 Segmental and somatic dysfunction of cervical region: Secondary | ICD-10-CM | POA: Diagnosis not present

## 2015-03-09 DIAGNOSIS — M4602 Spinal enthesopathy, cervical region: Secondary | ICD-10-CM | POA: Diagnosis not present

## 2015-03-09 DIAGNOSIS — M5033 Other cervical disc degeneration, cervicothoracic region: Secondary | ICD-10-CM | POA: Diagnosis not present

## 2015-03-09 DIAGNOSIS — M545 Low back pain: Secondary | ICD-10-CM | POA: Diagnosis not present

## 2015-03-09 DIAGNOSIS — M9903 Segmental and somatic dysfunction of lumbar region: Secondary | ICD-10-CM | POA: Diagnosis not present

## 2015-03-09 DIAGNOSIS — M791 Myalgia: Secondary | ICD-10-CM | POA: Diagnosis not present

## 2015-03-09 DIAGNOSIS — M5136 Other intervertebral disc degeneration, lumbar region: Secondary | ICD-10-CM | POA: Diagnosis not present

## 2015-03-09 DIAGNOSIS — M5417 Radiculopathy, lumbosacral region: Secondary | ICD-10-CM | POA: Diagnosis not present

## 2015-03-09 DIAGNOSIS — M542 Cervicalgia: Secondary | ICD-10-CM | POA: Diagnosis not present

## 2015-03-09 DIAGNOSIS — M461 Sacroiliitis, not elsewhere classified: Secondary | ICD-10-CM | POA: Diagnosis not present

## 2015-03-12 DIAGNOSIS — M461 Sacroiliitis, not elsewhere classified: Secondary | ICD-10-CM | POA: Diagnosis not present

## 2015-03-12 DIAGNOSIS — M542 Cervicalgia: Secondary | ICD-10-CM | POA: Diagnosis not present

## 2015-03-12 DIAGNOSIS — M5033 Other cervical disc degeneration, cervicothoracic region: Secondary | ICD-10-CM | POA: Diagnosis not present

## 2015-03-12 DIAGNOSIS — M791 Myalgia: Secondary | ICD-10-CM | POA: Diagnosis not present

## 2015-03-12 DIAGNOSIS — M9903 Segmental and somatic dysfunction of lumbar region: Secondary | ICD-10-CM | POA: Diagnosis not present

## 2015-03-12 DIAGNOSIS — M545 Low back pain: Secondary | ICD-10-CM | POA: Diagnosis not present

## 2015-03-12 DIAGNOSIS — M5136 Other intervertebral disc degeneration, lumbar region: Secondary | ICD-10-CM | POA: Diagnosis not present

## 2015-03-12 DIAGNOSIS — M9901 Segmental and somatic dysfunction of cervical region: Secondary | ICD-10-CM | POA: Diagnosis not present

## 2015-03-12 DIAGNOSIS — M546 Pain in thoracic spine: Secondary | ICD-10-CM | POA: Diagnosis not present

## 2015-03-12 DIAGNOSIS — M5417 Radiculopathy, lumbosacral region: Secondary | ICD-10-CM | POA: Diagnosis not present

## 2015-03-24 DIAGNOSIS — M9900 Segmental and somatic dysfunction of head region: Secondary | ICD-10-CM | POA: Diagnosis not present

## 2015-03-24 DIAGNOSIS — M9902 Segmental and somatic dysfunction of thoracic region: Secondary | ICD-10-CM | POA: Diagnosis not present

## 2015-03-24 DIAGNOSIS — M9901 Segmental and somatic dysfunction of cervical region: Secondary | ICD-10-CM | POA: Diagnosis not present

## 2015-03-24 DIAGNOSIS — M530 Cervicocranial syndrome: Secondary | ICD-10-CM | POA: Diagnosis not present

## 2015-03-26 DIAGNOSIS — M9901 Segmental and somatic dysfunction of cervical region: Secondary | ICD-10-CM | POA: Diagnosis not present

## 2015-03-26 DIAGNOSIS — M9903 Segmental and somatic dysfunction of lumbar region: Secondary | ICD-10-CM | POA: Diagnosis not present

## 2015-03-26 DIAGNOSIS — M5417 Radiculopathy, lumbosacral region: Secondary | ICD-10-CM | POA: Diagnosis not present

## 2015-03-26 DIAGNOSIS — M546 Pain in thoracic spine: Secondary | ICD-10-CM | POA: Diagnosis not present

## 2015-03-26 DIAGNOSIS — M791 Myalgia: Secondary | ICD-10-CM | POA: Diagnosis not present

## 2015-03-26 DIAGNOSIS — M545 Low back pain: Secondary | ICD-10-CM | POA: Diagnosis not present

## 2015-03-26 DIAGNOSIS — M461 Sacroiliitis, not elsewhere classified: Secondary | ICD-10-CM | POA: Diagnosis not present

## 2015-03-26 DIAGNOSIS — M542 Cervicalgia: Secondary | ICD-10-CM | POA: Diagnosis not present

## 2015-03-26 DIAGNOSIS — M5033 Other cervical disc degeneration, cervicothoracic region: Secondary | ICD-10-CM | POA: Diagnosis not present

## 2015-03-26 DIAGNOSIS — M5136 Other intervertebral disc degeneration, lumbar region: Secondary | ICD-10-CM | POA: Diagnosis not present

## 2015-03-30 DIAGNOSIS — M5417 Radiculopathy, lumbosacral region: Secondary | ICD-10-CM | POA: Diagnosis not present

## 2015-03-30 DIAGNOSIS — M545 Low back pain: Secondary | ICD-10-CM | POA: Diagnosis not present

## 2015-03-30 DIAGNOSIS — M9901 Segmental and somatic dysfunction of cervical region: Secondary | ICD-10-CM | POA: Diagnosis not present

## 2015-03-30 DIAGNOSIS — M5136 Other intervertebral disc degeneration, lumbar region: Secondary | ICD-10-CM | POA: Diagnosis not present

## 2015-03-30 DIAGNOSIS — M542 Cervicalgia: Secondary | ICD-10-CM | POA: Diagnosis not present

## 2015-03-30 DIAGNOSIS — M9903 Segmental and somatic dysfunction of lumbar region: Secondary | ICD-10-CM | POA: Diagnosis not present

## 2015-03-30 DIAGNOSIS — M5033 Other cervical disc degeneration, cervicothoracic region: Secondary | ICD-10-CM | POA: Diagnosis not present

## 2015-03-30 DIAGNOSIS — M461 Sacroiliitis, not elsewhere classified: Secondary | ICD-10-CM | POA: Diagnosis not present

## 2015-03-30 DIAGNOSIS — M546 Pain in thoracic spine: Secondary | ICD-10-CM | POA: Diagnosis not present

## 2015-03-30 DIAGNOSIS — M791 Myalgia: Secondary | ICD-10-CM | POA: Diagnosis not present

## 2015-04-02 DIAGNOSIS — M5417 Radiculopathy, lumbosacral region: Secondary | ICD-10-CM | POA: Diagnosis not present

## 2015-04-02 DIAGNOSIS — M9903 Segmental and somatic dysfunction of lumbar region: Secondary | ICD-10-CM | POA: Diagnosis not present

## 2015-04-02 DIAGNOSIS — M9901 Segmental and somatic dysfunction of cervical region: Secondary | ICD-10-CM | POA: Diagnosis not present

## 2015-04-02 DIAGNOSIS — M5033 Other cervical disc degeneration, cervicothoracic region: Secondary | ICD-10-CM | POA: Diagnosis not present

## 2015-04-02 DIAGNOSIS — M545 Low back pain: Secondary | ICD-10-CM | POA: Diagnosis not present

## 2015-04-02 DIAGNOSIS — M461 Sacroiliitis, not elsewhere classified: Secondary | ICD-10-CM | POA: Diagnosis not present

## 2015-04-02 DIAGNOSIS — M542 Cervicalgia: Secondary | ICD-10-CM | POA: Diagnosis not present

## 2015-04-02 DIAGNOSIS — M791 Myalgia: Secondary | ICD-10-CM | POA: Diagnosis not present

## 2015-04-02 DIAGNOSIS — M5136 Other intervertebral disc degeneration, lumbar region: Secondary | ICD-10-CM | POA: Diagnosis not present

## 2015-04-02 DIAGNOSIS — M546 Pain in thoracic spine: Secondary | ICD-10-CM | POA: Diagnosis not present

## 2015-04-06 DIAGNOSIS — M545 Low back pain: Secondary | ICD-10-CM | POA: Diagnosis not present

## 2015-04-06 DIAGNOSIS — M5417 Radiculopathy, lumbosacral region: Secondary | ICD-10-CM | POA: Diagnosis not present

## 2015-04-06 DIAGNOSIS — M9903 Segmental and somatic dysfunction of lumbar region: Secondary | ICD-10-CM | POA: Diagnosis not present

## 2015-04-06 DIAGNOSIS — M9901 Segmental and somatic dysfunction of cervical region: Secondary | ICD-10-CM | POA: Diagnosis not present

## 2015-04-06 DIAGNOSIS — M5033 Other cervical disc degeneration, cervicothoracic region: Secondary | ICD-10-CM | POA: Diagnosis not present

## 2015-04-06 DIAGNOSIS — M5136 Other intervertebral disc degeneration, lumbar region: Secondary | ICD-10-CM | POA: Diagnosis not present

## 2015-04-06 DIAGNOSIS — M791 Myalgia: Secondary | ICD-10-CM | POA: Diagnosis not present

## 2015-04-06 DIAGNOSIS — M542 Cervicalgia: Secondary | ICD-10-CM | POA: Diagnosis not present

## 2015-04-06 DIAGNOSIS — M461 Sacroiliitis, not elsewhere classified: Secondary | ICD-10-CM | POA: Diagnosis not present

## 2015-04-06 DIAGNOSIS — M546 Pain in thoracic spine: Secondary | ICD-10-CM | POA: Diagnosis not present

## 2015-04-09 DIAGNOSIS — M545 Low back pain: Secondary | ICD-10-CM | POA: Diagnosis not present

## 2015-04-09 DIAGNOSIS — M5417 Radiculopathy, lumbosacral region: Secondary | ICD-10-CM | POA: Diagnosis not present

## 2015-04-09 DIAGNOSIS — M9901 Segmental and somatic dysfunction of cervical region: Secondary | ICD-10-CM | POA: Diagnosis not present

## 2015-04-09 DIAGNOSIS — M9903 Segmental and somatic dysfunction of lumbar region: Secondary | ICD-10-CM | POA: Diagnosis not present

## 2015-04-09 DIAGNOSIS — M791 Myalgia: Secondary | ICD-10-CM | POA: Diagnosis not present

## 2015-04-09 DIAGNOSIS — M546 Pain in thoracic spine: Secondary | ICD-10-CM | POA: Diagnosis not present

## 2015-04-09 DIAGNOSIS — M5136 Other intervertebral disc degeneration, lumbar region: Secondary | ICD-10-CM | POA: Diagnosis not present

## 2015-04-09 DIAGNOSIS — M461 Sacroiliitis, not elsewhere classified: Secondary | ICD-10-CM | POA: Diagnosis not present

## 2015-04-09 DIAGNOSIS — M5033 Other cervical disc degeneration, cervicothoracic region: Secondary | ICD-10-CM | POA: Diagnosis not present

## 2015-04-09 DIAGNOSIS — M542 Cervicalgia: Secondary | ICD-10-CM | POA: Diagnosis not present

## 2015-04-13 DIAGNOSIS — M5417 Radiculopathy, lumbosacral region: Secondary | ICD-10-CM | POA: Diagnosis not present

## 2015-04-13 DIAGNOSIS — M461 Sacroiliitis, not elsewhere classified: Secondary | ICD-10-CM | POA: Diagnosis not present

## 2015-04-13 DIAGNOSIS — M542 Cervicalgia: Secondary | ICD-10-CM | POA: Diagnosis not present

## 2015-04-13 DIAGNOSIS — M545 Low back pain: Secondary | ICD-10-CM | POA: Diagnosis not present

## 2015-04-13 DIAGNOSIS — M9903 Segmental and somatic dysfunction of lumbar region: Secondary | ICD-10-CM | POA: Diagnosis not present

## 2015-04-13 DIAGNOSIS — M546 Pain in thoracic spine: Secondary | ICD-10-CM | POA: Diagnosis not present

## 2015-04-13 DIAGNOSIS — M5033 Other cervical disc degeneration, cervicothoracic region: Secondary | ICD-10-CM | POA: Diagnosis not present

## 2015-04-13 DIAGNOSIS — M791 Myalgia: Secondary | ICD-10-CM | POA: Diagnosis not present

## 2015-04-13 DIAGNOSIS — M5136 Other intervertebral disc degeneration, lumbar region: Secondary | ICD-10-CM | POA: Diagnosis not present

## 2015-04-13 DIAGNOSIS — M9901 Segmental and somatic dysfunction of cervical region: Secondary | ICD-10-CM | POA: Diagnosis not present

## 2015-04-16 DIAGNOSIS — M530 Cervicocranial syndrome: Secondary | ICD-10-CM | POA: Diagnosis not present

## 2015-04-16 DIAGNOSIS — M9901 Segmental and somatic dysfunction of cervical region: Secondary | ICD-10-CM | POA: Diagnosis not present

## 2015-04-16 DIAGNOSIS — M9902 Segmental and somatic dysfunction of thoracic region: Secondary | ICD-10-CM | POA: Diagnosis not present

## 2015-04-16 DIAGNOSIS — M9900 Segmental and somatic dysfunction of head region: Secondary | ICD-10-CM | POA: Diagnosis not present

## 2015-04-24 DIAGNOSIS — M9902 Segmental and somatic dysfunction of thoracic region: Secondary | ICD-10-CM | POA: Diagnosis not present

## 2015-04-24 DIAGNOSIS — M9901 Segmental and somatic dysfunction of cervical region: Secondary | ICD-10-CM | POA: Diagnosis not present

## 2015-04-24 DIAGNOSIS — M530 Cervicocranial syndrome: Secondary | ICD-10-CM | POA: Diagnosis not present

## 2015-04-24 DIAGNOSIS — M9900 Segmental and somatic dysfunction of head region: Secondary | ICD-10-CM | POA: Diagnosis not present

## 2015-04-30 DIAGNOSIS — M542 Cervicalgia: Secondary | ICD-10-CM | POA: Diagnosis not present

## 2015-04-30 DIAGNOSIS — M5417 Radiculopathy, lumbosacral region: Secondary | ICD-10-CM | POA: Diagnosis not present

## 2015-04-30 DIAGNOSIS — M791 Myalgia: Secondary | ICD-10-CM | POA: Diagnosis not present

## 2015-04-30 DIAGNOSIS — M461 Sacroiliitis, not elsewhere classified: Secondary | ICD-10-CM | POA: Diagnosis not present

## 2015-04-30 DIAGNOSIS — M5033 Other cervical disc degeneration, cervicothoracic region: Secondary | ICD-10-CM | POA: Diagnosis not present

## 2015-04-30 DIAGNOSIS — M5136 Other intervertebral disc degeneration, lumbar region: Secondary | ICD-10-CM | POA: Diagnosis not present

## 2015-04-30 DIAGNOSIS — M545 Low back pain: Secondary | ICD-10-CM | POA: Diagnosis not present

## 2015-04-30 DIAGNOSIS — M546 Pain in thoracic spine: Secondary | ICD-10-CM | POA: Diagnosis not present

## 2015-04-30 DIAGNOSIS — M9901 Segmental and somatic dysfunction of cervical region: Secondary | ICD-10-CM | POA: Diagnosis not present

## 2015-04-30 DIAGNOSIS — M9903 Segmental and somatic dysfunction of lumbar region: Secondary | ICD-10-CM | POA: Diagnosis not present

## 2015-05-01 DIAGNOSIS — M9900 Segmental and somatic dysfunction of head region: Secondary | ICD-10-CM | POA: Diagnosis not present

## 2015-05-01 DIAGNOSIS — M9902 Segmental and somatic dysfunction of thoracic region: Secondary | ICD-10-CM | POA: Diagnosis not present

## 2015-05-01 DIAGNOSIS — M530 Cervicocranial syndrome: Secondary | ICD-10-CM | POA: Diagnosis not present

## 2015-05-01 DIAGNOSIS — M9901 Segmental and somatic dysfunction of cervical region: Secondary | ICD-10-CM | POA: Diagnosis not present

## 2015-05-04 DIAGNOSIS — M791 Myalgia: Secondary | ICD-10-CM | POA: Diagnosis not present

## 2015-05-04 DIAGNOSIS — M461 Sacroiliitis, not elsewhere classified: Secondary | ICD-10-CM | POA: Diagnosis not present

## 2015-05-04 DIAGNOSIS — M7062 Trochanteric bursitis, left hip: Secondary | ICD-10-CM | POA: Diagnosis not present

## 2015-05-04 DIAGNOSIS — M542 Cervicalgia: Secondary | ICD-10-CM | POA: Diagnosis not present

## 2015-05-04 DIAGNOSIS — M5417 Radiculopathy, lumbosacral region: Secondary | ICD-10-CM | POA: Diagnosis not present

## 2015-05-04 DIAGNOSIS — M545 Low back pain: Secondary | ICD-10-CM | POA: Diagnosis not present

## 2015-05-04 DIAGNOSIS — M5136 Other intervertebral disc degeneration, lumbar region: Secondary | ICD-10-CM | POA: Diagnosis not present

## 2015-05-04 DIAGNOSIS — M9903 Segmental and somatic dysfunction of lumbar region: Secondary | ICD-10-CM | POA: Diagnosis not present

## 2015-05-04 DIAGNOSIS — M9901 Segmental and somatic dysfunction of cervical region: Secondary | ICD-10-CM | POA: Diagnosis not present

## 2015-05-04 DIAGNOSIS — M25552 Pain in left hip: Secondary | ICD-10-CM | POA: Diagnosis not present

## 2015-05-04 DIAGNOSIS — M5033 Other cervical disc degeneration, cervicothoracic region: Secondary | ICD-10-CM | POA: Diagnosis not present

## 2015-05-07 DIAGNOSIS — M9900 Segmental and somatic dysfunction of head region: Secondary | ICD-10-CM | POA: Diagnosis not present

## 2015-05-07 DIAGNOSIS — M9901 Segmental and somatic dysfunction of cervical region: Secondary | ICD-10-CM | POA: Diagnosis not present

## 2015-05-07 DIAGNOSIS — M9902 Segmental and somatic dysfunction of thoracic region: Secondary | ICD-10-CM | POA: Diagnosis not present

## 2015-05-07 DIAGNOSIS — M530 Cervicocranial syndrome: Secondary | ICD-10-CM | POA: Diagnosis not present

## 2015-05-11 DIAGNOSIS — M461 Sacroiliitis, not elsewhere classified: Secondary | ICD-10-CM | POA: Diagnosis not present

## 2015-05-11 DIAGNOSIS — M5136 Other intervertebral disc degeneration, lumbar region: Secondary | ICD-10-CM | POA: Diagnosis not present

## 2015-05-11 DIAGNOSIS — M5033 Other cervical disc degeneration, cervicothoracic region: Secondary | ICD-10-CM | POA: Diagnosis not present

## 2015-05-11 DIAGNOSIS — M9903 Segmental and somatic dysfunction of lumbar region: Secondary | ICD-10-CM | POA: Diagnosis not present

## 2015-05-11 DIAGNOSIS — M25551 Pain in right hip: Secondary | ICD-10-CM | POA: Diagnosis not present

## 2015-05-11 DIAGNOSIS — M5417 Radiculopathy, lumbosacral region: Secondary | ICD-10-CM | POA: Diagnosis not present

## 2015-05-11 DIAGNOSIS — M545 Low back pain: Secondary | ICD-10-CM | POA: Diagnosis not present

## 2015-05-11 DIAGNOSIS — M9901 Segmental and somatic dysfunction of cervical region: Secondary | ICD-10-CM | POA: Diagnosis not present

## 2015-05-11 DIAGNOSIS — M25552 Pain in left hip: Secondary | ICD-10-CM | POA: Diagnosis not present

## 2015-05-11 DIAGNOSIS — M542 Cervicalgia: Secondary | ICD-10-CM | POA: Diagnosis not present

## 2015-05-22 DIAGNOSIS — M530 Cervicocranial syndrome: Secondary | ICD-10-CM | POA: Diagnosis not present

## 2015-05-22 DIAGNOSIS — M9900 Segmental and somatic dysfunction of head region: Secondary | ICD-10-CM | POA: Diagnosis not present

## 2015-05-22 DIAGNOSIS — M9902 Segmental and somatic dysfunction of thoracic region: Secondary | ICD-10-CM | POA: Diagnosis not present

## 2015-05-22 DIAGNOSIS — M9901 Segmental and somatic dysfunction of cervical region: Secondary | ICD-10-CM | POA: Diagnosis not present

## 2015-05-27 DIAGNOSIS — M9901 Segmental and somatic dysfunction of cervical region: Secondary | ICD-10-CM | POA: Diagnosis not present

## 2015-05-27 DIAGNOSIS — M9902 Segmental and somatic dysfunction of thoracic region: Secondary | ICD-10-CM | POA: Diagnosis not present

## 2015-05-27 DIAGNOSIS — M9900 Segmental and somatic dysfunction of head region: Secondary | ICD-10-CM | POA: Diagnosis not present

## 2015-05-27 DIAGNOSIS — M530 Cervicocranial syndrome: Secondary | ICD-10-CM | POA: Diagnosis not present

## 2015-05-28 DIAGNOSIS — N390 Urinary tract infection, site not specified: Secondary | ICD-10-CM | POA: Diagnosis not present

## 2015-05-29 DIAGNOSIS — A499 Bacterial infection, unspecified: Secondary | ICD-10-CM | POA: Diagnosis not present

## 2015-05-29 DIAGNOSIS — N390 Urinary tract infection, site not specified: Secondary | ICD-10-CM | POA: Diagnosis not present

## 2015-06-03 DIAGNOSIS — M9903 Segmental and somatic dysfunction of lumbar region: Secondary | ICD-10-CM | POA: Diagnosis not present

## 2015-06-03 DIAGNOSIS — M9901 Segmental and somatic dysfunction of cervical region: Secondary | ICD-10-CM | POA: Diagnosis not present

## 2015-06-03 DIAGNOSIS — M9902 Segmental and somatic dysfunction of thoracic region: Secondary | ICD-10-CM | POA: Diagnosis not present

## 2015-06-03 DIAGNOSIS — R42 Dizziness and giddiness: Secondary | ICD-10-CM | POA: Diagnosis not present

## 2015-06-12 DIAGNOSIS — E784 Other hyperlipidemia: Secondary | ICD-10-CM | POA: Diagnosis not present

## 2015-06-12 DIAGNOSIS — I10 Essential (primary) hypertension: Secondary | ICD-10-CM | POA: Diagnosis not present

## 2015-06-12 DIAGNOSIS — Z6831 Body mass index (BMI) 31.0-31.9, adult: Secondary | ICD-10-CM | POA: Diagnosis not present

## 2015-06-12 DIAGNOSIS — F411 Generalized anxiety disorder: Secondary | ICD-10-CM | POA: Diagnosis not present

## 2015-06-12 DIAGNOSIS — R5381 Other malaise: Secondary | ICD-10-CM | POA: Diagnosis not present

## 2015-06-21 DIAGNOSIS — S29012A Strain of muscle and tendon of back wall of thorax, initial encounter: Secondary | ICD-10-CM | POA: Diagnosis not present

## 2015-06-21 DIAGNOSIS — T148 Other injury of unspecified body region: Secondary | ICD-10-CM | POA: Diagnosis not present

## 2015-06-30 DIAGNOSIS — M47896 Other spondylosis, lumbar region: Secondary | ICD-10-CM | POA: Diagnosis not present

## 2015-06-30 DIAGNOSIS — M545 Low back pain: Secondary | ICD-10-CM | POA: Diagnosis not present

## 2015-06-30 DIAGNOSIS — M47816 Spondylosis without myelopathy or radiculopathy, lumbar region: Secondary | ICD-10-CM | POA: Diagnosis not present

## 2015-07-06 DIAGNOSIS — M47816 Spondylosis without myelopathy or radiculopathy, lumbar region: Secondary | ICD-10-CM | POA: Diagnosis not present

## 2015-07-06 DIAGNOSIS — M5134 Other intervertebral disc degeneration, thoracic region: Secondary | ICD-10-CM | POA: Diagnosis not present

## 2015-07-06 DIAGNOSIS — M5124 Other intervertebral disc displacement, thoracic region: Secondary | ICD-10-CM | POA: Diagnosis not present

## 2015-07-06 DIAGNOSIS — M5136 Other intervertebral disc degeneration, lumbar region: Secondary | ICD-10-CM | POA: Diagnosis not present

## 2015-07-06 DIAGNOSIS — X58XXXA Exposure to other specified factors, initial encounter: Secondary | ICD-10-CM | POA: Diagnosis not present

## 2015-07-06 DIAGNOSIS — M4806 Spinal stenosis, lumbar region: Secondary | ICD-10-CM | POA: Diagnosis not present

## 2015-07-06 DIAGNOSIS — M5126 Other intervertebral disc displacement, lumbar region: Secondary | ICD-10-CM | POA: Diagnosis not present

## 2015-08-10 DIAGNOSIS — R21 Rash and other nonspecific skin eruption: Secondary | ICD-10-CM | POA: Diagnosis not present

## 2015-08-10 DIAGNOSIS — Z6832 Body mass index (BMI) 32.0-32.9, adult: Secondary | ICD-10-CM | POA: Diagnosis not present

## 2015-08-13 DIAGNOSIS — M545 Low back pain: Secondary | ICD-10-CM | POA: Diagnosis not present

## 2015-08-13 DIAGNOSIS — M2560 Stiffness of unspecified joint, not elsewhere classified: Secondary | ICD-10-CM | POA: Diagnosis not present

## 2015-08-13 DIAGNOSIS — S3982XA Other specified injuries of lower back, initial encounter: Secondary | ICD-10-CM | POA: Diagnosis not present

## 2015-08-13 DIAGNOSIS — M5136 Other intervertebral disc degeneration, lumbar region: Secondary | ICD-10-CM | POA: Diagnosis not present

## 2015-08-20 DIAGNOSIS — J209 Acute bronchitis, unspecified: Secondary | ICD-10-CM | POA: Diagnosis not present

## 2015-08-20 DIAGNOSIS — L255 Unspecified contact dermatitis due to plants, except food: Secondary | ICD-10-CM | POA: Diagnosis not present

## 2015-08-25 DIAGNOSIS — M25551 Pain in right hip: Secondary | ICD-10-CM | POA: Diagnosis not present

## 2015-08-25 DIAGNOSIS — R262 Difficulty in walking, not elsewhere classified: Secondary | ICD-10-CM | POA: Diagnosis not present

## 2015-08-25 DIAGNOSIS — M25552 Pain in left hip: Secondary | ICD-10-CM | POA: Diagnosis not present

## 2015-08-25 DIAGNOSIS — M545 Low back pain: Secondary | ICD-10-CM | POA: Diagnosis not present

## 2015-08-27 DIAGNOSIS — M25551 Pain in right hip: Secondary | ICD-10-CM | POA: Diagnosis not present

## 2015-08-27 DIAGNOSIS — M545 Low back pain: Secondary | ICD-10-CM | POA: Diagnosis not present

## 2015-08-27 DIAGNOSIS — R262 Difficulty in walking, not elsewhere classified: Secondary | ICD-10-CM | POA: Diagnosis not present

## 2015-08-27 DIAGNOSIS — M25552 Pain in left hip: Secondary | ICD-10-CM | POA: Diagnosis not present

## 2015-09-01 DIAGNOSIS — M545 Low back pain: Secondary | ICD-10-CM | POA: Diagnosis not present

## 2015-09-01 DIAGNOSIS — M25551 Pain in right hip: Secondary | ICD-10-CM | POA: Diagnosis not present

## 2015-09-01 DIAGNOSIS — R262 Difficulty in walking, not elsewhere classified: Secondary | ICD-10-CM | POA: Diagnosis not present

## 2015-09-01 DIAGNOSIS — M25552 Pain in left hip: Secondary | ICD-10-CM | POA: Diagnosis not present

## 2015-09-03 DIAGNOSIS — R262 Difficulty in walking, not elsewhere classified: Secondary | ICD-10-CM | POA: Diagnosis not present

## 2015-09-03 DIAGNOSIS — M545 Low back pain: Secondary | ICD-10-CM | POA: Diagnosis not present

## 2015-09-03 DIAGNOSIS — M25551 Pain in right hip: Secondary | ICD-10-CM | POA: Diagnosis not present

## 2015-09-03 DIAGNOSIS — M25552 Pain in left hip: Secondary | ICD-10-CM | POA: Diagnosis not present

## 2015-09-08 DIAGNOSIS — R262 Difficulty in walking, not elsewhere classified: Secondary | ICD-10-CM | POA: Diagnosis not present

## 2015-09-08 DIAGNOSIS — M25551 Pain in right hip: Secondary | ICD-10-CM | POA: Diagnosis not present

## 2015-09-08 DIAGNOSIS — M25552 Pain in left hip: Secondary | ICD-10-CM | POA: Diagnosis not present

## 2015-09-08 DIAGNOSIS — M545 Low back pain: Secondary | ICD-10-CM | POA: Diagnosis not present

## 2015-09-09 DIAGNOSIS — S3982XD Other specified injuries of lower back, subsequent encounter: Secondary | ICD-10-CM | POA: Diagnosis not present

## 2015-09-09 DIAGNOSIS — M545 Low back pain: Secondary | ICD-10-CM | POA: Diagnosis not present

## 2015-09-09 DIAGNOSIS — M5136 Other intervertebral disc degeneration, lumbar region: Secondary | ICD-10-CM | POA: Diagnosis not present

## 2015-09-11 DIAGNOSIS — M25551 Pain in right hip: Secondary | ICD-10-CM | POA: Diagnosis not present

## 2015-09-11 DIAGNOSIS — M545 Low back pain: Secondary | ICD-10-CM | POA: Diagnosis not present

## 2015-09-11 DIAGNOSIS — R262 Difficulty in walking, not elsewhere classified: Secondary | ICD-10-CM | POA: Diagnosis not present

## 2015-09-11 DIAGNOSIS — M25552 Pain in left hip: Secondary | ICD-10-CM | POA: Diagnosis not present

## 2015-09-14 DIAGNOSIS — M25551 Pain in right hip: Secondary | ICD-10-CM | POA: Diagnosis not present

## 2015-09-14 DIAGNOSIS — M545 Low back pain: Secondary | ICD-10-CM | POA: Diagnosis not present

## 2015-09-14 DIAGNOSIS — R262 Difficulty in walking, not elsewhere classified: Secondary | ICD-10-CM | POA: Diagnosis not present

## 2015-09-14 DIAGNOSIS — M25552 Pain in left hip: Secondary | ICD-10-CM | POA: Diagnosis not present

## 2015-09-15 DIAGNOSIS — M25552 Pain in left hip: Secondary | ICD-10-CM | POA: Diagnosis not present

## 2015-09-15 DIAGNOSIS — R262 Difficulty in walking, not elsewhere classified: Secondary | ICD-10-CM | POA: Diagnosis not present

## 2015-09-15 DIAGNOSIS — M545 Low back pain: Secondary | ICD-10-CM | POA: Diagnosis not present

## 2015-09-15 DIAGNOSIS — M25551 Pain in right hip: Secondary | ICD-10-CM | POA: Diagnosis not present

## 2015-09-17 DIAGNOSIS — Z779 Other contact with and (suspected) exposures hazardous to health: Secondary | ICD-10-CM | POA: Diagnosis not present

## 2015-09-17 DIAGNOSIS — Z1231 Encounter for screening mammogram for malignant neoplasm of breast: Secondary | ICD-10-CM | POA: Diagnosis not present

## 2015-09-17 DIAGNOSIS — Z6832 Body mass index (BMI) 32.0-32.9, adult: Secondary | ICD-10-CM | POA: Diagnosis not present

## 2015-09-17 DIAGNOSIS — Z124 Encounter for screening for malignant neoplasm of cervix: Secondary | ICD-10-CM | POA: Diagnosis not present

## 2015-09-18 DIAGNOSIS — Z1389 Encounter for screening for other disorder: Secondary | ICD-10-CM | POA: Diagnosis not present

## 2015-09-18 DIAGNOSIS — M5126 Other intervertebral disc displacement, lumbar region: Secondary | ICD-10-CM | POA: Diagnosis not present

## 2015-09-18 DIAGNOSIS — I10 Essential (primary) hypertension: Secondary | ICD-10-CM | POA: Diagnosis not present

## 2015-09-18 DIAGNOSIS — E784 Other hyperlipidemia: Secondary | ICD-10-CM | POA: Diagnosis not present

## 2015-09-18 DIAGNOSIS — Z9181 History of falling: Secondary | ICD-10-CM | POA: Diagnosis not present

## 2015-09-18 DIAGNOSIS — G4733 Obstructive sleep apnea (adult) (pediatric): Secondary | ICD-10-CM | POA: Diagnosis not present

## 2015-09-22 DIAGNOSIS — M545 Low back pain: Secondary | ICD-10-CM | POA: Diagnosis not present

## 2015-09-22 DIAGNOSIS — R262 Difficulty in walking, not elsewhere classified: Secondary | ICD-10-CM | POA: Diagnosis not present

## 2015-09-22 DIAGNOSIS — M25552 Pain in left hip: Secondary | ICD-10-CM | POA: Diagnosis not present

## 2015-09-22 DIAGNOSIS — M25551 Pain in right hip: Secondary | ICD-10-CM | POA: Diagnosis not present

## 2015-09-23 ENCOUNTER — Other Ambulatory Visit: Payer: Self-pay | Admitting: Obstetrics and Gynecology

## 2015-09-23 DIAGNOSIS — R928 Other abnormal and inconclusive findings on diagnostic imaging of breast: Secondary | ICD-10-CM

## 2015-09-24 DIAGNOSIS — M25552 Pain in left hip: Secondary | ICD-10-CM | POA: Diagnosis not present

## 2015-09-24 DIAGNOSIS — M545 Low back pain: Secondary | ICD-10-CM | POA: Diagnosis not present

## 2015-09-24 DIAGNOSIS — R262 Difficulty in walking, not elsewhere classified: Secondary | ICD-10-CM | POA: Diagnosis not present

## 2015-09-24 DIAGNOSIS — M25551 Pain in right hip: Secondary | ICD-10-CM | POA: Diagnosis not present

## 2015-09-25 ENCOUNTER — Ambulatory Visit
Admission: RE | Admit: 2015-09-25 | Discharge: 2015-09-25 | Disposition: A | Discharge status: Home / Self Care | Payer: Medicare Other | Source: Ambulatory Visit | Attending: Obstetrics and Gynecology | Admitting: Obstetrics and Gynecology

## 2015-09-25 DIAGNOSIS — R928 Other abnormal and inconclusive findings on diagnostic imaging of breast: Secondary | ICD-10-CM

## 2015-10-01 DIAGNOSIS — M25551 Pain in right hip: Secondary | ICD-10-CM | POA: Diagnosis not present

## 2015-10-01 DIAGNOSIS — R262 Difficulty in walking, not elsewhere classified: Secondary | ICD-10-CM | POA: Diagnosis not present

## 2015-10-01 DIAGNOSIS — M25552 Pain in left hip: Secondary | ICD-10-CM | POA: Diagnosis not present

## 2015-10-01 DIAGNOSIS — M545 Low back pain: Secondary | ICD-10-CM | POA: Diagnosis not present

## 2015-10-05 DIAGNOSIS — H26493 Other secondary cataract, bilateral: Secondary | ICD-10-CM | POA: Diagnosis not present

## 2015-10-06 DIAGNOSIS — M530 Cervicocranial syndrome: Secondary | ICD-10-CM | POA: Diagnosis not present

## 2015-10-06 DIAGNOSIS — M9903 Segmental and somatic dysfunction of lumbar region: Secondary | ICD-10-CM | POA: Diagnosis not present

## 2015-10-06 DIAGNOSIS — M25551 Pain in right hip: Secondary | ICD-10-CM | POA: Diagnosis not present

## 2015-10-06 DIAGNOSIS — M9908 Segmental and somatic dysfunction of rib cage: Secondary | ICD-10-CM | POA: Diagnosis not present

## 2015-10-06 DIAGNOSIS — M25552 Pain in left hip: Secondary | ICD-10-CM | POA: Diagnosis not present

## 2015-10-06 DIAGNOSIS — R262 Difficulty in walking, not elsewhere classified: Secondary | ICD-10-CM | POA: Diagnosis not present

## 2015-10-06 DIAGNOSIS — M9900 Segmental and somatic dysfunction of head region: Secondary | ICD-10-CM | POA: Diagnosis not present

## 2015-10-06 DIAGNOSIS — M9902 Segmental and somatic dysfunction of thoracic region: Secondary | ICD-10-CM | POA: Diagnosis not present

## 2015-10-06 DIAGNOSIS — M545 Low back pain: Secondary | ICD-10-CM | POA: Diagnosis not present

## 2015-10-08 DIAGNOSIS — M25551 Pain in right hip: Secondary | ICD-10-CM | POA: Diagnosis not present

## 2015-10-08 DIAGNOSIS — R262 Difficulty in walking, not elsewhere classified: Secondary | ICD-10-CM | POA: Diagnosis not present

## 2015-10-08 DIAGNOSIS — M545 Low back pain: Secondary | ICD-10-CM | POA: Diagnosis not present

## 2015-10-08 DIAGNOSIS — M25552 Pain in left hip: Secondary | ICD-10-CM | POA: Diagnosis not present

## 2015-10-12 DIAGNOSIS — M25551 Pain in right hip: Secondary | ICD-10-CM | POA: Diagnosis not present

## 2015-10-12 DIAGNOSIS — M545 Low back pain: Secondary | ICD-10-CM | POA: Diagnosis not present

## 2015-10-12 DIAGNOSIS — M25552 Pain in left hip: Secondary | ICD-10-CM | POA: Diagnosis not present

## 2015-10-12 DIAGNOSIS — R262 Difficulty in walking, not elsewhere classified: Secondary | ICD-10-CM | POA: Diagnosis not present

## 2015-10-15 DIAGNOSIS — M545 Low back pain: Secondary | ICD-10-CM | POA: Diagnosis not present

## 2015-10-15 DIAGNOSIS — M25552 Pain in left hip: Secondary | ICD-10-CM | POA: Diagnosis not present

## 2015-10-15 DIAGNOSIS — R262 Difficulty in walking, not elsewhere classified: Secondary | ICD-10-CM | POA: Diagnosis not present

## 2015-10-15 DIAGNOSIS — M25551 Pain in right hip: Secondary | ICD-10-CM | POA: Diagnosis not present

## 2015-10-20 DIAGNOSIS — M25552 Pain in left hip: Secondary | ICD-10-CM | POA: Diagnosis not present

## 2015-10-20 DIAGNOSIS — M545 Low back pain: Secondary | ICD-10-CM | POA: Diagnosis not present

## 2015-10-20 DIAGNOSIS — M25551 Pain in right hip: Secondary | ICD-10-CM | POA: Diagnosis not present

## 2015-10-20 DIAGNOSIS — R262 Difficulty in walking, not elsewhere classified: Secondary | ICD-10-CM | POA: Diagnosis not present

## 2015-10-22 DIAGNOSIS — M2560 Stiffness of unspecified joint, not elsewhere classified: Secondary | ICD-10-CM | POA: Diagnosis not present

## 2015-10-22 DIAGNOSIS — M5136 Other intervertebral disc degeneration, lumbar region: Secondary | ICD-10-CM | POA: Diagnosis not present

## 2015-10-22 DIAGNOSIS — S3982XD Other specified injuries of lower back, subsequent encounter: Secondary | ICD-10-CM | POA: Diagnosis not present

## 2015-10-22 DIAGNOSIS — M545 Low back pain: Secondary | ICD-10-CM | POA: Diagnosis not present

## 2015-11-06 DIAGNOSIS — M9900 Segmental and somatic dysfunction of head region: Secondary | ICD-10-CM | POA: Diagnosis not present

## 2015-11-06 DIAGNOSIS — M9903 Segmental and somatic dysfunction of lumbar region: Secondary | ICD-10-CM | POA: Diagnosis not present

## 2015-11-06 DIAGNOSIS — M530 Cervicocranial syndrome: Secondary | ICD-10-CM | POA: Diagnosis not present

## 2015-11-06 DIAGNOSIS — M9902 Segmental and somatic dysfunction of thoracic region: Secondary | ICD-10-CM | POA: Diagnosis not present

## 2015-11-06 DIAGNOSIS — M9908 Segmental and somatic dysfunction of rib cage: Secondary | ICD-10-CM | POA: Diagnosis not present

## 2015-11-16 DIAGNOSIS — M9902 Segmental and somatic dysfunction of thoracic region: Secondary | ICD-10-CM | POA: Diagnosis not present

## 2015-11-16 DIAGNOSIS — M9900 Segmental and somatic dysfunction of head region: Secondary | ICD-10-CM | POA: Diagnosis not present

## 2015-11-16 DIAGNOSIS — M9908 Segmental and somatic dysfunction of rib cage: Secondary | ICD-10-CM | POA: Diagnosis not present

## 2015-11-16 DIAGNOSIS — M530 Cervicocranial syndrome: Secondary | ICD-10-CM | POA: Diagnosis not present

## 2015-11-16 DIAGNOSIS — M9903 Segmental and somatic dysfunction of lumbar region: Secondary | ICD-10-CM | POA: Diagnosis not present

## 2015-11-27 DIAGNOSIS — M9908 Segmental and somatic dysfunction of rib cage: Secondary | ICD-10-CM | POA: Diagnosis not present

## 2015-11-27 DIAGNOSIS — M9902 Segmental and somatic dysfunction of thoracic region: Secondary | ICD-10-CM | POA: Diagnosis not present

## 2015-11-27 DIAGNOSIS — M9900 Segmental and somatic dysfunction of head region: Secondary | ICD-10-CM | POA: Diagnosis not present

## 2015-11-27 DIAGNOSIS — M530 Cervicocranial syndrome: Secondary | ICD-10-CM | POA: Diagnosis not present

## 2015-11-27 DIAGNOSIS — M9903 Segmental and somatic dysfunction of lumbar region: Secondary | ICD-10-CM | POA: Diagnosis not present

## 2015-11-30 DIAGNOSIS — M9902 Segmental and somatic dysfunction of thoracic region: Secondary | ICD-10-CM | POA: Diagnosis not present

## 2015-11-30 DIAGNOSIS — M530 Cervicocranial syndrome: Secondary | ICD-10-CM | POA: Diagnosis not present

## 2015-11-30 DIAGNOSIS — M9903 Segmental and somatic dysfunction of lumbar region: Secondary | ICD-10-CM | POA: Diagnosis not present

## 2015-11-30 DIAGNOSIS — M9908 Segmental and somatic dysfunction of rib cage: Secondary | ICD-10-CM | POA: Diagnosis not present

## 2015-11-30 DIAGNOSIS — M9900 Segmental and somatic dysfunction of head region: Secondary | ICD-10-CM | POA: Diagnosis not present

## 2015-12-18 DIAGNOSIS — M530 Cervicocranial syndrome: Secondary | ICD-10-CM | POA: Diagnosis not present

## 2015-12-18 DIAGNOSIS — M9903 Segmental and somatic dysfunction of lumbar region: Secondary | ICD-10-CM | POA: Diagnosis not present

## 2015-12-18 DIAGNOSIS — M9908 Segmental and somatic dysfunction of rib cage: Secondary | ICD-10-CM | POA: Diagnosis not present

## 2015-12-18 DIAGNOSIS — M9900 Segmental and somatic dysfunction of head region: Secondary | ICD-10-CM | POA: Diagnosis not present

## 2015-12-18 DIAGNOSIS — M9902 Segmental and somatic dysfunction of thoracic region: Secondary | ICD-10-CM | POA: Diagnosis not present

## 2016-01-01 DIAGNOSIS — M9908 Segmental and somatic dysfunction of rib cage: Secondary | ICD-10-CM | POA: Diagnosis not present

## 2016-01-01 DIAGNOSIS — M9902 Segmental and somatic dysfunction of thoracic region: Secondary | ICD-10-CM | POA: Diagnosis not present

## 2016-01-01 DIAGNOSIS — M530 Cervicocranial syndrome: Secondary | ICD-10-CM | POA: Diagnosis not present

## 2016-01-01 DIAGNOSIS — M9900 Segmental and somatic dysfunction of head region: Secondary | ICD-10-CM | POA: Diagnosis not present

## 2016-01-01 DIAGNOSIS — M9903 Segmental and somatic dysfunction of lumbar region: Secondary | ICD-10-CM | POA: Diagnosis not present

## 2016-01-04 HISTORY — PX: KNEE SURGERY: SHX244

## 2016-02-08 DIAGNOSIS — M9904 Segmental and somatic dysfunction of sacral region: Secondary | ICD-10-CM | POA: Diagnosis not present

## 2016-02-08 DIAGNOSIS — M5432 Sciatica, left side: Secondary | ICD-10-CM | POA: Diagnosis not present

## 2016-02-08 DIAGNOSIS — M9903 Segmental and somatic dysfunction of lumbar region: Secondary | ICD-10-CM | POA: Diagnosis not present

## 2016-02-08 DIAGNOSIS — M9905 Segmental and somatic dysfunction of pelvic region: Secondary | ICD-10-CM | POA: Diagnosis not present

## 2016-02-16 DIAGNOSIS — Z6830 Body mass index (BMI) 30.0-30.9, adult: Secondary | ICD-10-CM | POA: Diagnosis not present

## 2016-02-16 DIAGNOSIS — M25562 Pain in left knee: Secondary | ICD-10-CM | POA: Diagnosis not present

## 2016-02-16 DIAGNOSIS — M79662 Pain in left lower leg: Secondary | ICD-10-CM | POA: Diagnosis not present

## 2016-02-16 DIAGNOSIS — N390 Urinary tract infection, site not specified: Secondary | ICD-10-CM | POA: Diagnosis not present

## 2016-02-19 DIAGNOSIS — M1712 Unilateral primary osteoarthritis, left knee: Secondary | ICD-10-CM | POA: Diagnosis not present

## 2016-02-19 DIAGNOSIS — M25562 Pain in left knee: Secondary | ICD-10-CM | POA: Diagnosis not present

## 2016-03-02 DIAGNOSIS — M25562 Pain in left knee: Secondary | ICD-10-CM | POA: Diagnosis not present

## 2016-03-02 DIAGNOSIS — M238X2 Other internal derangements of left knee: Secondary | ICD-10-CM | POA: Diagnosis not present

## 2016-03-04 DIAGNOSIS — M9905 Segmental and somatic dysfunction of pelvic region: Secondary | ICD-10-CM | POA: Diagnosis not present

## 2016-03-04 DIAGNOSIS — M9903 Segmental and somatic dysfunction of lumbar region: Secondary | ICD-10-CM | POA: Diagnosis not present

## 2016-03-04 DIAGNOSIS — M5432 Sciatica, left side: Secondary | ICD-10-CM | POA: Diagnosis not present

## 2016-03-04 DIAGNOSIS — M9904 Segmental and somatic dysfunction of sacral region: Secondary | ICD-10-CM | POA: Diagnosis not present

## 2016-03-10 DIAGNOSIS — M25562 Pain in left knee: Secondary | ICD-10-CM | POA: Diagnosis not present

## 2016-03-21 DIAGNOSIS — M25562 Pain in left knee: Secondary | ICD-10-CM | POA: Diagnosis not present

## 2016-03-21 DIAGNOSIS — M238X2 Other internal derangements of left knee: Secondary | ICD-10-CM | POA: Diagnosis not present

## 2016-03-25 DIAGNOSIS — E784 Other hyperlipidemia: Secondary | ICD-10-CM | POA: Diagnosis not present

## 2016-03-25 DIAGNOSIS — Z6829 Body mass index (BMI) 29.0-29.9, adult: Secondary | ICD-10-CM | POA: Diagnosis not present

## 2016-03-25 DIAGNOSIS — M25562 Pain in left knee: Secondary | ICD-10-CM | POA: Diagnosis not present

## 2016-03-25 DIAGNOSIS — F411 Generalized anxiety disorder: Secondary | ICD-10-CM | POA: Diagnosis not present

## 2016-03-25 DIAGNOSIS — I10 Essential (primary) hypertension: Secondary | ICD-10-CM | POA: Diagnosis not present

## 2016-03-25 DIAGNOSIS — J309 Allergic rhinitis, unspecified: Secondary | ICD-10-CM | POA: Diagnosis not present

## 2016-03-30 DIAGNOSIS — M238X2 Other internal derangements of left knee: Secondary | ICD-10-CM | POA: Diagnosis not present

## 2016-03-30 DIAGNOSIS — M25562 Pain in left knee: Secondary | ICD-10-CM | POA: Diagnosis not present

## 2016-04-07 DIAGNOSIS — M23304 Other meniscus derangements, unspecified medial meniscus, left knee: Secondary | ICD-10-CM | POA: Diagnosis not present

## 2016-04-07 DIAGNOSIS — M94262 Chondromalacia, left knee: Secondary | ICD-10-CM | POA: Diagnosis not present

## 2016-04-07 DIAGNOSIS — S83232A Complex tear of medial meniscus, current injury, left knee, initial encounter: Secondary | ICD-10-CM | POA: Diagnosis not present

## 2016-04-07 DIAGNOSIS — M1712 Unilateral primary osteoarthritis, left knee: Secondary | ICD-10-CM | POA: Diagnosis not present

## 2016-04-07 DIAGNOSIS — G8918 Other acute postprocedural pain: Secondary | ICD-10-CM | POA: Diagnosis not present

## 2016-04-07 DIAGNOSIS — M2242 Chondromalacia patellae, left knee: Secondary | ICD-10-CM | POA: Diagnosis not present

## 2016-04-13 DIAGNOSIS — M238X2 Other internal derangements of left knee: Secondary | ICD-10-CM | POA: Diagnosis not present

## 2016-04-15 DIAGNOSIS — M238X2 Other internal derangements of left knee: Secondary | ICD-10-CM | POA: Diagnosis not present

## 2016-04-18 DIAGNOSIS — Z4789 Encounter for other orthopedic aftercare: Secondary | ICD-10-CM | POA: Diagnosis not present

## 2016-04-18 DIAGNOSIS — M23322 Other meniscus derangements, posterior horn of medial meniscus, left knee: Secondary | ICD-10-CM | POA: Diagnosis not present

## 2016-04-22 DIAGNOSIS — M238X2 Other internal derangements of left knee: Secondary | ICD-10-CM | POA: Diagnosis not present

## 2016-04-26 DIAGNOSIS — M238X2 Other internal derangements of left knee: Secondary | ICD-10-CM | POA: Diagnosis not present

## 2016-04-28 DIAGNOSIS — M238X2 Other internal derangements of left knee: Secondary | ICD-10-CM | POA: Diagnosis not present

## 2016-05-02 DIAGNOSIS — M9904 Segmental and somatic dysfunction of sacral region: Secondary | ICD-10-CM | POA: Diagnosis not present

## 2016-05-02 DIAGNOSIS — M9903 Segmental and somatic dysfunction of lumbar region: Secondary | ICD-10-CM | POA: Diagnosis not present

## 2016-05-02 DIAGNOSIS — M5432 Sciatica, left side: Secondary | ICD-10-CM | POA: Diagnosis not present

## 2016-05-02 DIAGNOSIS — M9905 Segmental and somatic dysfunction of pelvic region: Secondary | ICD-10-CM | POA: Diagnosis not present

## 2016-05-03 DIAGNOSIS — M238X2 Other internal derangements of left knee: Secondary | ICD-10-CM | POA: Diagnosis not present

## 2016-05-05 DIAGNOSIS — M238X2 Other internal derangements of left knee: Secondary | ICD-10-CM | POA: Diagnosis not present

## 2016-05-10 DIAGNOSIS — M238X2 Other internal derangements of left knee: Secondary | ICD-10-CM | POA: Diagnosis not present

## 2016-05-12 DIAGNOSIS — M238X2 Other internal derangements of left knee: Secondary | ICD-10-CM | POA: Diagnosis not present

## 2016-05-17 DIAGNOSIS — M238X2 Other internal derangements of left knee: Secondary | ICD-10-CM | POA: Diagnosis not present

## 2016-05-19 DIAGNOSIS — M238X2 Other internal derangements of left knee: Secondary | ICD-10-CM | POA: Diagnosis not present

## 2016-05-19 DIAGNOSIS — M23322 Other meniscus derangements, posterior horn of medial meniscus, left knee: Secondary | ICD-10-CM | POA: Diagnosis not present

## 2016-05-19 DIAGNOSIS — Z4789 Encounter for other orthopedic aftercare: Secondary | ICD-10-CM | POA: Diagnosis not present

## 2016-05-24 DIAGNOSIS — M238X2 Other internal derangements of left knee: Secondary | ICD-10-CM | POA: Diagnosis not present

## 2016-05-25 DIAGNOSIS — M5432 Sciatica, left side: Secondary | ICD-10-CM | POA: Diagnosis not present

## 2016-05-25 DIAGNOSIS — M9903 Segmental and somatic dysfunction of lumbar region: Secondary | ICD-10-CM | POA: Diagnosis not present

## 2016-05-25 DIAGNOSIS — M9904 Segmental and somatic dysfunction of sacral region: Secondary | ICD-10-CM | POA: Diagnosis not present

## 2016-05-25 DIAGNOSIS — M9905 Segmental and somatic dysfunction of pelvic region: Secondary | ICD-10-CM | POA: Diagnosis not present

## 2016-05-31 DIAGNOSIS — M23322 Other meniscus derangements, posterior horn of medial meniscus, left knee: Secondary | ICD-10-CM | POA: Diagnosis not present

## 2016-06-20 DIAGNOSIS — M9903 Segmental and somatic dysfunction of lumbar region: Secondary | ICD-10-CM | POA: Diagnosis not present

## 2016-06-20 DIAGNOSIS — M9904 Segmental and somatic dysfunction of sacral region: Secondary | ICD-10-CM | POA: Diagnosis not present

## 2016-06-20 DIAGNOSIS — M5432 Sciatica, left side: Secondary | ICD-10-CM | POA: Diagnosis not present

## 2016-06-20 DIAGNOSIS — M9905 Segmental and somatic dysfunction of pelvic region: Secondary | ICD-10-CM | POA: Diagnosis not present

## 2016-06-23 DIAGNOSIS — S8002XA Contusion of left knee, initial encounter: Secondary | ICD-10-CM | POA: Diagnosis not present

## 2016-06-23 DIAGNOSIS — Z6829 Body mass index (BMI) 29.0-29.9, adult: Secondary | ICD-10-CM | POA: Diagnosis not present

## 2016-06-23 DIAGNOSIS — S70361A Insect bite (nonvenomous), right thigh, initial encounter: Secondary | ICD-10-CM | POA: Diagnosis not present

## 2016-07-12 DIAGNOSIS — Z4789 Encounter for other orthopedic aftercare: Secondary | ICD-10-CM | POA: Diagnosis not present

## 2016-08-23 DIAGNOSIS — M238X2 Other internal derangements of left knee: Secondary | ICD-10-CM | POA: Diagnosis not present

## 2016-09-06 ENCOUNTER — Ambulatory Visit: Payer: Medicare Other | Attending: Orthopedic Surgery | Admitting: Physical Therapy

## 2016-09-06 DIAGNOSIS — R262 Difficulty in walking, not elsewhere classified: Secondary | ICD-10-CM | POA: Insufficient documentation

## 2016-09-06 DIAGNOSIS — R293 Abnormal posture: Secondary | ICD-10-CM | POA: Insufficient documentation

## 2016-09-06 DIAGNOSIS — M25662 Stiffness of left knee, not elsewhere classified: Secondary | ICD-10-CM | POA: Diagnosis not present

## 2016-09-06 DIAGNOSIS — R600 Localized edema: Secondary | ICD-10-CM | POA: Insufficient documentation

## 2016-09-06 DIAGNOSIS — M6281 Muscle weakness (generalized): Secondary | ICD-10-CM | POA: Diagnosis not present

## 2016-09-06 NOTE — Therapy (Signed)
[place], [place], [address] Phone: [phone]   Fax:  [phone]  Physical Therapy Evaluation  Patient Details  Name: Sharon Marsh MRN: [id number] Date of Birth: 1945-01-28 Referring Provider: Dr. Ramonita Lab  Encounter Date: 09/06/2016      PT End of Session - 09/06/16 1604    Visit Number 1   Number of Visits 16   Date for PT Re-Evaluation 11/01/16   PT Start Time 7494   PT Stop Time 1423   PT Time Calculation (min) 48 min   Activity Tolerance Patient tolerated treatment well   Behavior During Therapy [facility] for tasks assessed/performed      Past Medical History:  Diagnosis Date  . GERD (gastroesophageal reflux disease)   . Hiatal hernia   . Hypertension   . Other and unspecified hyperlipidemia   . Personal history of vulvar dysplasia   . PONV (postoperative nausea and vomiting)     Past Surgical History:  Procedure Laterality Date  . ABDOMINAL HYSTERECTOMY    . APPENDECTOMY  1950  . THYROID SURGERY  1980  . VULVECTOMY N/A 06/22/2012   Procedure: WIDE EXCISION VULVECTOMY;  Surgeon: Allyn Kenner, DO;  Location: [facility];  Service: Gynecology;  Laterality: N/A;  wide local excision of vulva  . 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       Subjective Assessment - 09/06/16 1338    Subjective Patient presents with ongoing worsening knee (hip, back) pain LLE since her knee surgery in April.  Today she had a particularly difficult time due to flare up of Lt post thigh (hamstring tightness). She did therapy for about 1 month.  She was having post knee pain at that time too.  No surgical report available but she reports MD was unable to repair her meniscus.  Fell after surgery x 2 right on her knee., thats when she began to have severe pain that limits walking, work, bending and squatting.  Sees a chiro for back pain.  she  feels she has aggravated her back by lifing. She endorses L knee giving way and affecting all aspects of her ADLs, mobility.      Pertinent History back pain , HTN   Limitations Sitting;Walking;Standing;Lifting   How long can you sit comfortably? 15 min hard to stand, knee stiffens up    How long can you stand comfortably? does 2 hours but is never comfortable   How long can you walk comfortably? not far, but always painful    Diagnostic tests XR   Patient Stated Goals Patient would like to be pain free, walking and the way I used to be (was walking 2 miles prior to Feb. )    Currently in Pain? Yes   Pain Score 9    Pain Location Leg   Pain Orientation Left   Pain Descriptors / Indicators Stabbing;Other (Comment);Tightness  "pain"   Pain Type Chronic pain   Pain Radiating Towards hip to calf   Pain Onset More than a month ago   Pain Frequency Intermittent   Aggravating Factors  walking, extending her knee   Pain Relieving Factors meds , ice, rest   Effect of Pain on Daily Activities painful gait, work            [facility] PT Assessment - 09/06/16 0001      Assessment   Medical Diagnosis L  knee meniscus tear s/p surgery    Referring Provider Dr. Ramonita Lab   Onset Date/Surgical Date 04/11/16  unknown exact date   Next MD Visit as needed   Prior Therapy Yes post surgery about 4 weeks     Precautions   Precautions None     Restrictions   Weight Bearing Restrictions No     Balance Screen   Has the patient fallen in the past 6 months Yes   How many times? 2   Has the patient had a decrease in activity level because of a fear of falling?  Yes  due to pain   Is the patient reluctant to leave their home because of a fear of falling?  No     Home Environment   Living Environment Private residence   Living Arrangements Alone   Type of [place] to enter   Entrance Stairs-Number of Steps 2   Entrance Stairs-Rails None   Home Layout One level   [place] - single point     Prior Function   Vocation Part time employment   Vocation Requirements housekeeping, cleaning    Leisure likes gardening, was walking, music      Cognition   Overall Cognitive Status Within Functional Limits for tasks assessed     Observation/Other Assessments   Focus on Therapeutic Outcomes (FOTO)  70%     Observation/Other Assessments-Edema    Edema Circumferential     Circumferential Edema   Circumferential - Right 14 3/4 inch mid calf   above patella was about the same    Circumferential - Left  15 3/8 inch mid calf      Sensation   Light Touch Appears Intact     Single Leg Stance   Comments unable to do so on LLE, immediately stopped due to pain      Other:   Other/ Comments weightshifting pain inc on L      Posture/Postural Control   Posture/Postural Control Postural limitations   Postural Limitations Rounded Shoulders;Forward head;Increased thoracic kyphosis;Right pelvic obliquity;Flexed trunk   Posture Comments upper trunk shifted to Rt lateral shift to R     AROM   Right Knee Extension -7   Right Knee Flexion 135   Left Knee Extension -12   Left Knee Flexion 132     Strength   Right Hip Flexion 4/5   Right Hip Extension 4/5   Right Hip ABduction 4+/5   Left Hip Flexion 4/5   Left Hip Extension 4-/5   Left Hip ABduction 3/5  pain    Right Knee Flexion 5/5   Right Knee Extension 5/5   Left Knee Flexion 4/5   Left Knee Extension 4+/5   Right/Left Ankle --  4/5 DF      Flexibility   Hamstrings 50  deg bilat.      Palpation   Patella mobility hypomobile L patella, pocket of swelling distal to knee, medially    Palpation comment TTP along L anterior tibialis, L lateral knee extending up ITB into L GR. Trochanter and gluteals, lumbar spine      Transfers   Comments transfers are slow and painful , stiffness in knees      Ambulation/Gait   Ambulation Distance (Feet) 150 Feet   Assistive device None   Gait Pattern  Antalgic;Lateral trunk lean to right   Ambulation Surface Level;Indoor            Objective measurements  completed on examination: See above findings.          [facility] Adult PT Treatment/Exercise - 09/06/16 0001      Self-Care   Self-Care Heat/Ice Application;Other Self-Care Comments   Heat/Ice Application ice for pain    Other Self-Care Comments  HEP, see education      Knee/Hip Exercises: Stretches   Active Hamstring Stretch Left;3 reps   ITB Stretch Left;3 reps   Gastroc Stretch Left;3 reps     Cryotherapy   Number Minutes Cryotherapy 10 Minutes   Cryotherapy Location Lumbar Spine;Knee   Type of Cryotherapy Ice pack                PT Education - 09/06/16 1603    Education provided Yes   Education Details PT/POC, HEP, ice for back and knee pain, stretching , posture   Person(s) Educated Patient   Methods Demonstration;Tactile cues;Explanation;Verbal cues;Handout   Comprehension Verbalized understanding;Returned demonstration;Need further instruction          PT Short Term Goals - 09/06/16 1615      PT SHORT TERM GOAL #1   Title Pt will be I with initial HEP for knee flexibility and pain control.     Time 4   Period Weeks   Status New   Target Date 10/04/16     PT SHORT TERM GOAL #2   Title Pt will be able to walk with more ease, less visible lateral shift and more upright trunk.    Time 4   Period Weeks   Status New   Target Date 10/04/16     PT SHORT TERM GOAL #3   Title Swelling in LLE to resolve in calf to improve tightness, knee flexion at rest in a chair.    Time 4   Period Weeks   Status New   Target Date 10/04/16           PT Long Term Goals - 09/06/16 1617      PT LONG TERM GOAL #1   Title FOTO score will improve to 50% or less impaired to demo functional improvement with walking, mobilty.    Time 8   Period Weeks   Status New   Target Date 11/01/16     PT LONG TERM GOAL #2   Title Pt will be able to work with pain  in Knee, back <4/10 most of the time.    Time 8   Period Weeks   Status New   Target Date 11/01/16     PT LONG TERM GOAL #3   Title Pt will be able to report less (50%) stiffness in L knee upon standing after driving, sitting 16-10 min    Time 8   Period Weeks   Status New   Target Date 11/01/16     PT LONG TERM GOAL #4   Title Pt will be I with more advanced HEP for knee, hip, core    Time 8   Period Weeks   Target Date 11/01/16     PT LONG TERM GOAL #5   Title Pt will be able to squat, lift with good body mechanics and carry over for prevention of reinjury.    Time 8   Period Weeks   Status New   Target Date 11/01/16                Plan - 09/06/16 1604    Clinical Impression Statement Pt with mod complexity eval for ongoing knee pain s/p  arthroscopic medial meniscus chrondroplasty.  She has had a mod amount of knee pain but lately has been experiencing increased hip and back pain as well, today she even had pain in her ankle.  Gait and posture abnormally shifted in standing.  She had a mild amount of edema in her calf and anterior knee.  She was given a basic stretching routine for L knee but may also benefit from lumbar spine evaluation as well to fully maximize recovery    History and Personal Factors relevant to plan of care: HTN, knee surgery, fall x 2, lives alone   Clinical Presentation Evolving   Clinical Presentation due to: addition of increasing back and hip pain, subjective report of L knee giving way.    Clinical Decision Making Moderate   Rehab Potential Excellent   PT Frequency 2x / week   PT Duration 8 weeks   PT Treatment/Interventions ADLs/Self Care Home Management;Patient/family education;Taping;Gait training;Stair training;Cryotherapy;Electrical Stimulation;Iontophoresis 4mg /ml Dexamethasone;Moist Heat;Functional mobility training;Therapeutic activities;Therapeutic exercise;Balance training;Neuromuscular re-education;Passive range of motion;Manual  techniques;Ultrasound;DME Instruction;Vasopneumatic Device;Dry needling;Manual lymph drainage   PT Next Visit Plan stretching, check swelling and try tape/vaso? posture in standing   PT Home Exercise Plan ITB, hamstring and calf stretching    Consulted and Agree with Plan of Care Patient      Patient will benefit from skilled therapeutic intervention in order to improve the following deficits and impairments:  Abnormal gait, Decreased activity tolerance, Decreased balance, Impaired flexibility, Postural dysfunction, Improper body mechanics, Decreased range of motion, Decreased mobility, Difficulty walking, Pain, Increased fascial restricitons, Decreased strength, Increased edema, Hypomobility  Visit Diagnosis: Localized edema      G-Codes - 10-04-16 1620    Functional Assessment Tool Used (Outpatient Only) FOTO   Functional Limitation Mobility: Walking and moving around   Mobility: Walking and Moving Around Current Status [phone]) At least 60 percent but less than 80 percent impaired, limited or restricted   Mobility: Walking and Moving Around Goal Status [phone]) At least 40 percent but less than 60 percent impaired, limited or restricted       Problem List Patient Active Problem List   Diagnosis Date Noted  . Anxiety state, unspecified 08/15/2013  . Unspecified hereditary and idiopathic peripheral neuropathy 08/15/2013  . Unspecified essential hypertension 08/15/2013  . Other and unspecified hyperlipidemia 08/15/2013  . Hyperlipidemia 08/15/2013    Angelly Spearing 10-04-2016, 4:23 PM  [facility] [address] [place], [place], [address] Phone: [phone]   Fax:  [phone]  Name: ANDRIAN URBACH MRN: [id number] Date of Birth: 04/09/1945   Raeford Razor, PT 2016/10/04 4:23 PM Phone: [phone] Fax: [phone]

## 2016-09-08 ENCOUNTER — Encounter: Payer: Self-pay | Admitting: Physical Therapy

## 2016-09-08 ENCOUNTER — Ambulatory Visit: Payer: Medicare Other | Admitting: Physical Therapy

## 2016-09-08 DIAGNOSIS — M6281 Muscle weakness (generalized): Secondary | ICD-10-CM

## 2016-09-08 DIAGNOSIS — R262 Difficulty in walking, not elsewhere classified: Secondary | ICD-10-CM

## 2016-09-08 DIAGNOSIS — R600 Localized edema: Secondary | ICD-10-CM | POA: Diagnosis not present

## 2016-09-08 DIAGNOSIS — R293 Abnormal posture: Secondary | ICD-10-CM

## 2016-09-08 DIAGNOSIS — M25662 Stiffness of left knee, not elsewhere classified: Secondary | ICD-10-CM | POA: Diagnosis not present

## 2016-09-08 NOTE — Therapy (Signed)
[place], [place], [address] Phone: [phone]   Fax:  [phone]  Physical Therapy Treatment  Patient Details  Name: Sharon Marsh MRN: [id number] Date of Birth: Dec 26, 1945 Referring Provider: Dr. Ramonita Lab  Encounter Date: 09/08/2016      PT End of Session - 09/08/16 1517    Visit Number 2   Number of Visits 16   PT Start Time 1332   PT Stop Time 1415   PT Time Calculation (min) 43 min   Activity Tolerance Patient tolerated treatment well   Behavior During Therapy [facility] for tasks assessed/performed      Past Medical History:  Diagnosis Date  . GERD (gastroesophageal reflux disease)   . Hiatal hernia   . Hypertension   . Other and unspecified hyperlipidemia   . Personal history of vulvar dysplasia   . PONV (postoperative nausea and vomiting)     Past Surgical History:  Procedure Laterality Date  . ABDOMINAL HYSTERECTOMY    . APPENDECTOMY  1950  . THYROID SURGERY  1980  . VULVECTOMY N/A 06/22/2012   Procedure: WIDE EXCISION VULVECTOMY;  Surgeon: Allyn Kenner, DO;  Location: [facility];  Service: Gynecology;  Laterality: N/A;  wide local excision of vulva  . 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      Subjective Assessment - 09/08/16 1341    Subjective Knee is feeling a little better  i iced before i came.  4/10.  i did not go to work today.    Pain bad over weekend,  I needed to use the crutch.     Currently in Pain? Yes   Pain Score 4    Pain Location Leg   Pain Orientation Left;Posterior   Pain Descriptors / Indicators Stabbing;Tightness   Pain Type Chronic pain   Pain Radiating Towards to ankle anterior, lateral and posterior   Pain Onset More than a month ago   Pain Frequency Constant   Aggravating Factors  walking, moving   Pain Relieving Factors meds,  ice,  rest   Effect of Pain on Daily Activities  limits going to work,  not sleeping well                         [facility] Adult PT Treatment/Exercise - 09/08/16 0001      Self-Care   Other Self-Care Comments  pain ,  edema management     Knee/Hip Exercises: Stretches   Passive Hamstring Stretch 3 reps;30 seconds   Quad Stretch 3 reps;30 seconds   Hip Flexor Stretch 3 reps;30 seconds   ITB Stretch 2 reps;20 seconds     Knee/Hip Exercises: Standing   Gait Training Noted patella/ femur IR with gait if patient keeps feet straight.  Pain decreased with cues. to [place] facing forward.      Knee/Hip Exercises: Supine   Quad Sets Limitations 10 X 5 seconds, HEP no pain increased     Modalities   Modalities Moist Heat     Moist Heat Therapy   Moist Heat Location --  leg left,  concurrent with manual.  different areas at times     Manual Therapy   Manual therapy comments retrograde,  patellar mobility slow glides of longer duration, PROM,  Lymph system vacume activation.  PT Education - 09/08/16 1424    Education provided Yes   Education Details HEP,  pain management,     Person(s) Educated Patient   Methods Explanation;Demonstration;Verbal cues;Handout   Comprehension Verbalized understanding;Returned demonstration          PT Short Term Goals - 09/06/16 1615      PT SHORT TERM GOAL #1   Title Pt will be I with initial HEP for knee flexibility and pain control.     Time 4   Period Weeks   Status New   Target Date 10/04/16     PT SHORT TERM GOAL #2   Title Pt will be able to walk with more ease, less visible lateral shift and more upright trunk.    Time 4   Period Weeks   Status New   Target Date 10/04/16     PT SHORT TERM GOAL #3   Title Swelling in LLE to resolve in calf to improve tightness, knee flexion at rest in a chair.    Time 4   Period Weeks   Status New   Target Date 10/04/16           PT Long Term Goals - 09/06/16 1617      PT LONG TERM GOAL #1    Title FOTO score will improve to 50% or less impaired to demo functional improvement with walking, mobilty.    Time 8   Period Weeks   Status New   Target Date 11/01/16     PT LONG TERM GOAL #2   Title Pt will be able to work with pain in Knee, back <4/10 most of the time.    Time 8   Period Weeks   Status New   Target Date 11/01/16     PT LONG TERM GOAL #3   Title Pt will be able to report less (50%) stiffness in L knee upon standing after driving, sitting 58-83 min    Time 8   Period Weeks   Status New   Target Date 11/01/16     PT LONG TERM GOAL #4   Title Pt will be I with more advanced HEP for knee, hip, core    Time 8   Period Weeks   Target Date 11/01/16     PT LONG TERM GOAL #5   Title Pt will be able to squat, lift with good body mechanics and carry over for prevention of reinjury.    Time 8   Period Weeks   Status New   Target Date 11/01/16               Plan - 09/08/16 1517    Clinical Impression Statement Patient had increased patellar mobility at end of session.  pain continued with weightbearing at end of session.  Suggested use cane for now.  Less pain with cues to keep patella forward.   No new goals met.  less pain in sitting than at beginning.     PT Treatment/Interventions ADLs/Self Care Home Management;Patient/family education;Taping;Gait training;Stair training;Cryotherapy;Electrical Stimulation;Iontophoresis 56m/ml Dexamethasone;Moist Heat;Functional mobility training;Therapeutic activities;Therapeutic exercise;Balance training;Neuromuscular re-education;Passive range of motion;Manual techniques;Ultrasound;DME Instruction;Vasopneumatic Device;Dry needling;Manual lymph drainage   PT Next Visit Plan stretching, check swelling and try tape/vaso? posture in standing,  manual as needed distal medial hamstrings.   PT Home Exercise Plan ITB, hamstring and calf stretching ,  quad set.   Consulted and Agree with Plan of Care Patient      Patient will  benefit from skilled therapeutic  intervention in order to improve the following deficits and impairments:  Abnormal gait, Decreased activity tolerance, Decreased balance, Impaired flexibility, Postural dysfunction, Improper body mechanics, Decreased range of motion, Decreased mobility, Difficulty walking, Pain, Increased fascial restricitons, Decreased strength, Increased edema, Hypomobility  Visit Diagnosis: Localized edema  Stiffness of left knee, not elsewhere classified  Muscle weakness (generalized)  Abnormal posture  Difficulty in walking, not elsewhere classified     Problem List Patient Active Problem List   Diagnosis Date Noted  . Anxiety state, unspecified 08/15/2013  . Unspecified hereditary and idiopathic peripheral neuropathy 08/15/2013  . Unspecified essential hypertension 08/15/2013  . Other and unspecified hyperlipidemia 08/15/2013  . Hyperlipidemia 08/15/2013    Kimon Loewen PTA 09/08/2016, 3:20 PM  [facility] [address] [place], [place], [address] Phone: [phone]   Fax:  [phone]  Name: Sharon Marsh MRN: [id number] Date of Birth: 28-Jan-1945

## 2016-09-08 NOTE — Patient Instructions (Signed)
Quad Set    With other leg bent, foot flat, slowly tighten muscles on thigh of straight leg while counting out loud to 5____. Repeat with other leg. Repeat _10___ times. Do _1-3___ sessions per day. May do sitting. Avoid pain  http://[email or web address].exer.us/276   Copyright  VHI. All rights reserved.

## 2016-09-12 ENCOUNTER — Ambulatory Visit: Payer: Medicare Other | Admitting: Physical Therapy

## 2016-09-13 ENCOUNTER — Encounter: Payer: Self-pay | Admitting: Physical Therapy

## 2016-09-13 ENCOUNTER — Ambulatory Visit: Payer: Medicare Other | Admitting: Physical Therapy

## 2016-09-13 DIAGNOSIS — R293 Abnormal posture: Secondary | ICD-10-CM | POA: Diagnosis not present

## 2016-09-13 DIAGNOSIS — M25662 Stiffness of left knee, not elsewhere classified: Secondary | ICD-10-CM

## 2016-09-13 DIAGNOSIS — R262 Difficulty in walking, not elsewhere classified: Secondary | ICD-10-CM

## 2016-09-13 DIAGNOSIS — R600 Localized edema: Secondary | ICD-10-CM | POA: Diagnosis not present

## 2016-09-13 DIAGNOSIS — M6281 Muscle weakness (generalized): Secondary | ICD-10-CM | POA: Diagnosis not present

## 2016-09-13 NOTE — Therapy (Signed)
[place], [place], [address] Phone: [phone]   Fax:  [phone]  Physical Therapy Treatment  Patient Details  Name: Sharon Marsh MRN: [id number] Date of Birth: August 19, 1945 Referring Provider: Dr. Ramonita Lab  Encounter Date: 09/13/2016      PT End of Session - 09/13/16 1749    Visit Number 3   Number of Visits 16   Date for PT Re-Evaluation 11/01/16   PT Start Time 7793   PT Stop Time 1504   PT Time Calculation (min) 49 min   Activity Tolerance Patient tolerated treatment well   Behavior During Therapy [facility] for tasks assessed/performed      Past Medical History:  Diagnosis Date  . GERD (gastroesophageal reflux disease)   . Hiatal hernia   . Hypertension   . Other and unspecified hyperlipidemia   . Personal history of vulvar dysplasia   . PONV (postoperative nausea and vomiting)     Past Surgical History:  Procedure Laterality Date  . ABDOMINAL HYSTERECTOMY    . APPENDECTOMY  1950  . THYROID SURGERY  1980  . VULVECTOMY N/A 06/22/2012   Procedure: WIDE EXCISION VULVECTOMY;  Surgeon: Allyn Kenner, DO;  Location: [facility];  Service: Gynecology;  Laterality: N/A;  wide local excision of vulva  . 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      Subjective Assessment - 09/13/16 1420    Subjective I slept all night last night.  Using less pain meds.  Able to manage pain with heat /  ice rest elevation.  Doing her exercises   Currently in Pain? Yes   Pain Score 6    Pain Location Leg   Pain Orientation Left;Posterior   Pain Descriptors / Indicators Stabbing;Tightness   Pain Type Chronic pain   Pain Radiating Towards to ankle   Pain Onset More than a month ago   Pain Frequency Intermittent   Aggravating Factors  walking,  exercise   Pain Relieving Factors meds,  exercise,  ice,  heat   Effect of Pain on Daily  Activities limits  ADL's takes longer                         [facility] Adult PT Treatment/Exercise - 09/13/16 0001      Ambulation/Gait   Gait Comments cane shortened,2 inches.  cued for gait with single point cane.  Gait "smoothed out"  with shortened cane and cues.  Increased weightbearing noted .      Self-Care   Other Self-Care Comments  discussed bracing.  Patient to bring her brace in next visit.     Knee/Hip Exercises: Stretches   Passive Hamstring Stretch 3 reps;30 seconds     Moist Heat Therapy   Moist Heat Location --  leg left,  concurrent with manual.  different areas at times     Manual Therapy   Manual therapy comments retrograde,  patellar mobility slow glides of longer duration, PROM,  Lymph system vacume activation.  concurent with moist heat                PT Education - 09/13/16 1749    Education provided Yes   Education Details Gait training, how knee unloaders work.   Person(s) Educated Patient   Methods Explanation;Demonstration;Verbal cues   Comprehension Verbalized understanding;Returned demonstration  PT Short Term Goals - 09/06/16 1615      PT SHORT TERM GOAL #1   Title Pt will be I with initial HEP for knee flexibility and pain control.     Time 4   Period Weeks   Status New   Target Date 10/04/16     PT SHORT TERM GOAL #2   Title Pt will be able to walk with more ease, less visible lateral shift and more upright trunk.    Time 4   Period Weeks   Status New   Target Date 10/04/16     PT SHORT TERM GOAL #3   Title Swelling in LLE to resolve in calf to improve tightness, knee flexion at rest in a chair.    Time 4   Period Weeks   Status New   Target Date 10/04/16           PT Long Term Goals - 09/06/16 1617      PT LONG TERM GOAL #1   Title FOTO score will improve to 50% or less impaired to demo functional improvement with walking, mobilty.    Time 8   Period Weeks   Status New   Target Date  11/01/16     PT LONG TERM GOAL #2   Title Pt will be able to work with pain in Knee, back <4/10 most of the time.    Time 8   Period Weeks   Status New   Target Date 11/01/16     PT LONG TERM GOAL #3   Title Pt will be able to report less (50%) stiffness in L knee upon standing after driving, sitting 79-02 min    Time 8   Period Weeks   Status New   Target Date 11/01/16     PT LONG TERM GOAL #4   Title Pt will be I with more advanced HEP for knee, hip, core    Time 8   Period Weeks   Target Date 11/01/16     PT LONG TERM GOAL #5   Title Pt will be able to squat, lift with good body mechanics and carry over for prevention of reinjury.    Time 8   Period Weeks   Status New   Target Date 11/01/16               Plan - 09/13/16 1750    Clinical Impression Statement Gait less painful at end of session with increased weightbearing noted.  Patient should be able to begin weaning from manual and progress toward more exercises next visit.     PT Next Visit Plan stretching, check swelling and try tape/vaso? posture in standing,  manual as needed distal medial hamstrings.  Consider hamstring curls,  weight shifting at wall.   PT Home Exercise Plan ITB, hamstring and calf stretching ,  quad set.   Consulted and Agree with Plan of Care Patient      Patient will benefit from skilled therapeutic intervention in order to improve the following deficits and impairments:  Abnormal gait, Decreased activity tolerance, Decreased balance, Impaired flexibility, Postural dysfunction, Improper body mechanics, Decreased range of motion, Decreased mobility, Difficulty walking, Pain, Increased fascial restricitons, Decreased strength, Increased edema, Hypomobility  Visit Diagnosis: Localized edema  Stiffness of left knee, not elsewhere classified  Muscle weakness (generalized)  Abnormal posture  Difficulty in walking, not elsewhere classified     Problem List Patient Active Problem  List   Diagnosis Date Noted  .  Anxiety state, unspecified 08/15/2013  . Unspecified hereditary and idiopathic peripheral neuropathy 08/15/2013  . Unspecified essential hypertension 08/15/2013  . Other and unspecified hyperlipidemia 08/15/2013  . Hyperlipidemia 08/15/2013    Romney Compean PTA 09/13/2016, 5:54 PM  [facility] [address] [place], [place], [address] Phone: [phone]   Fax:  [phone]  Name: Sharon Marsh MRN: [id number] Date of Birth: February 12, 1945

## 2016-09-15 ENCOUNTER — Ambulatory Visit: Payer: Medicare Other | Admitting: Physical Therapy

## 2016-09-15 DIAGNOSIS — M25662 Stiffness of left knee, not elsewhere classified: Secondary | ICD-10-CM | POA: Diagnosis not present

## 2016-09-15 DIAGNOSIS — R293 Abnormal posture: Secondary | ICD-10-CM | POA: Diagnosis not present

## 2016-09-15 DIAGNOSIS — M6281 Muscle weakness (generalized): Secondary | ICD-10-CM

## 2016-09-15 DIAGNOSIS — R262 Difficulty in walking, not elsewhere classified: Secondary | ICD-10-CM

## 2016-09-15 DIAGNOSIS — R600 Localized edema: Secondary | ICD-10-CM | POA: Diagnosis not present

## 2016-09-15 NOTE — Therapy (Signed)
[place], [place], [address] Phone: [phone]   Fax:  [phone]  Physical Therapy Treatment  Patient Details  Name: Sharon Marsh MRN: [id number] Date of Birth: 15-Apr-1945 Referring Provider: Dr. Ramonita Lab  Encounter Date: 09/15/2016      PT End of Session - 09/15/16 1219    Visit Number 4   Number of Visits 16   Date for PT Re-Evaluation 11/01/16   PT Start Time 1937   PT Stop Time 1235   PT Time Calculation (min) 50 min      Past Medical History:  Diagnosis Date  . GERD (gastroesophageal reflux disease)   . Hiatal hernia   . Hypertension   . Other and unspecified hyperlipidemia   . Personal history of vulvar dysplasia   . PONV (postoperative nausea and vomiting)     Past Surgical History:  Procedure Laterality Date  . ABDOMINAL HYSTERECTOMY    . APPENDECTOMY  1950  . THYROID SURGERY  1980  . VULVECTOMY N/A 06/22/2012   Procedure: WIDE EXCISION VULVECTOMY;  Surgeon: Allyn Kenner, DO;  Location: [facility];  Service: Gynecology;  Laterality: N/A;  wide local excision of vulva  . 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      Subjective Assessment - 09/15/16 1208    Currently in Pain? Yes   Pain Score 5    Pain Location Leg   Pain Orientation Left;Posterior;Lateral;Medial   Pain Descriptors / Indicators Stabbing                         [facility] Adult PT Treatment/Exercise - 09/15/16 0001      Knee/Hip Exercises: Stretches   Active Hamstring Stretch 2 reps;20 seconds   Active Hamstring Stretch Limitations 90/90   Piriformis Stretch 2 reps;20 seconds   Piriformis Stretch Limitations IR and ER    Other Knee/Hip Stretches knee to chest stretch left 2 x 20 sec      Knee/Hip Exercises: Supine   Quad Sets 10 reps   Bridges 10 reps   Other Supine Knee/Hip Exercises glute squeeze/pelvic tilt x  10       Knee/Hip Exercises: Sidelying   Clams x 10 left, reverse x 10      Modalities   Modalities Ultrasound     Ultrasound   Ultrasound Location medial knee   Ultrasound Parameters 1 mhz 1.2 w/cm2 100%   Ultrasound Goals Pain     Manual Therapy   Manual therapy comments ITB masage roller                   PT Short Term Goals - 09/06/16 1615      PT SHORT TERM GOAL #1   Title Pt will be I with initial HEP for knee flexibility and pain control.     Time 4   Period Weeks   Status New   Target Date 10/04/16     PT SHORT TERM GOAL #2   Title Pt will be able to walk with more ease, less visible lateral shift and more upright trunk.    Time 4   Period Weeks   Status New   Target Date 10/04/16     PT SHORT TERM GOAL #3   Title Swelling in LLE to resolve in calf to improve tightness, knee flexion at rest in a chair.  Time 4   Period Weeks   Status New   Target Date 10/04/16           PT Long Term Goals - 09/06/16 1617      PT LONG TERM GOAL #1   Title FOTO score will improve to 50% or less impaired to demo functional improvement with walking, mobilty.    Time 8   Period Weeks   Status New   Target Date 11/01/16     PT LONG TERM GOAL #2   Title Pt will be able to work with pain in Knee, back <4/10 most of the time.    Time 8   Period Weeks   Status New   Target Date 11/01/16     PT LONG TERM GOAL #3   Title Pt will be able to report less (50%) stiffness in L knee upon standing after driving, sitting 79-89 min    Time 8   Period Weeks   Status New   Target Date 11/01/16     PT LONG TERM GOAL #4   Title Pt will be I with more advanced HEP for knee, hip, core    Time 8   Period Weeks   Target Date 11/01/16     PT LONG TERM GOAL #5   Title Pt will be able to squat, lift with good body mechanics and carry over for prevention of reinjury.    Time 8   Period Weeks   Status New   Target Date 11/01/16               Plan - 09/15/16  1236    Clinical Impression Statement Added hip stretches and hip strengthening today. Pt reports increased knee pain after hip stretches. Massage roller to ITB  and  [place] to medial knee which pt reported feeling better at end of treatment.    PT Next Visit Plan assess tolerance to exercises  and [place] this visit; stretching, check swelling and try tape/vaso (pt does not like cold)  posture in standing,  manual as needed distal medial hamstrings.  Consider hamstring curls,  weight shifting at wall.   PT Home Exercise Plan ITB, hamstring and calf stretching ,  quad set.   Consulted and Agree with Plan of Care Patient      Patient will benefit from skilled therapeutic intervention in order to improve the following deficits and impairments:  Abnormal gait, Decreased activity tolerance, Decreased balance, Impaired flexibility, Postural dysfunction, Improper body mechanics, Decreased range of motion, Decreased mobility, Difficulty walking, Pain, Increased fascial restricitons, Decreased strength, Increased edema, Hypomobility  Visit Diagnosis: Localized edema  Stiffness of left knee, not elsewhere classified  Muscle weakness (generalized)  Abnormal posture  Difficulty in walking, not elsewhere classified     Problem List Patient Active Problem List   Diagnosis Date Noted  . Anxiety state, unspecified 08/15/2013  . Unspecified hereditary and idiopathic peripheral neuropathy 08/15/2013  . Unspecified essential hypertension 08/15/2013  . Other and unspecified hyperlipidemia 08/15/2013  . Hyperlipidemia 08/15/2013    Dorene Ar, PTA 09/15/2016, 12:39 PM  [place] [facility] [address] [place], [place], [address] Phone: [phone]   Fax:  [phone]  Name: Sharon Marsh MRN: [id number] Date of Birth: [birth year]

## 2016-09-20 ENCOUNTER — Encounter: Payer: Self-pay | Admitting: Physical Therapy

## 2016-09-20 ENCOUNTER — Ambulatory Visit: Payer: Medicare Other | Admitting: Physical Therapy

## 2016-09-20 DIAGNOSIS — R262 Difficulty in walking, not elsewhere classified: Secondary | ICD-10-CM

## 2016-09-20 DIAGNOSIS — M25662 Stiffness of left knee, not elsewhere classified: Secondary | ICD-10-CM | POA: Diagnosis not present

## 2016-09-20 DIAGNOSIS — R293 Abnormal posture: Secondary | ICD-10-CM | POA: Diagnosis not present

## 2016-09-20 DIAGNOSIS — R600 Localized edema: Secondary | ICD-10-CM

## 2016-09-20 DIAGNOSIS — M6281 Muscle weakness (generalized): Secondary | ICD-10-CM | POA: Diagnosis not present

## 2016-09-20 NOTE — Therapy (Signed)
[place], [place], [address] Phone: [phone]   Fax:  [phone]  Physical Therapy Treatment  Patient Details  Name: Sharon Marsh MRN: [id number] Date of Birth: 1945-06-03 Referring Provider: Dr. Ramonita Lab  Encounter Date: 09/20/2016      PT End of Session - 09/20/16 1804    Visit Number 5   Number of Visits 16   Date for PT Re-Evaluation 11/01/16   PT Start Time 1330   PT Stop Time 1417   PT Time Calculation (min) 47 min   Activity Tolerance Patient tolerated treatment well   Behavior During Therapy [facility] for tasks assessed/performed      Past Medical History:  Diagnosis Date  . GERD (gastroesophageal reflux disease)   . Hiatal hernia   . Hypertension   . Other and unspecified hyperlipidemia   . Personal history of vulvar dysplasia   . PONV (postoperative nausea and vomiting)     Past Surgical History:  Procedure Laterality Date  . ABDOMINAL HYSTERECTOMY    . APPENDECTOMY  1950  . THYROID SURGERY  1980  . VULVECTOMY N/A 06/22/2012   Procedure: WIDE EXCISION VULVECTOMY;  Surgeon: Allyn Kenner, DO;  Location: [facility];  Service: Gynecology;  Laterality: N/A;  wide local excision of vulva  . 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      Subjective Assessment - 09/20/16 1337    Subjective I woke without pain. (New and improved)  IT area pain started after an hour of moving.  My now after activities in the house  ADL"s  laundry,  walking to barn 50 fdeet.  Pain  increased with no cane with walking.     Currently in Pain? Yes   Pain Score 5    Pain Location Leg   Pain Orientation Right;Left;Medial   Pain Descriptors / Indicators Aching;Stabbing   Pain Type Chronic pain   Pain Frequency Intermittent   Aggravating Factors  walking ADL's   Pain Relieving Factors PT  heat  exercise   Effect of Pain on Daily  Activities Limits walking                         [facility] Adult PT Treatment/Exercise - 09/20/16 0001      Self-Care   Other Self-Care Comments  discussed how to improve patellar tracking,   anatomy,  reasons for tracking issues etc.     Knee/Hip Exercises: Stretches   Passive Hamstring Stretch 3 reps;30 seconds     Knee/Hip Exercises: Standing   Gait Training gait training with single point cane.  able to extend knee post session woith less pain.     Knee/Hip Exercises: Seated   Long Arc Quad 10 reps   Long Arc Quad Limitations with ball squeeze     Knee/Hip Exercises: Supine   Quad Sets 10 reps   [employer] Limitations cues   Short Arc [employer] 10 reps   Short Arc Quad Sets Limitations with ball squeeze     Moist Heat Therapy   Number Minutes Moist Heat 20 Minutes   Moist Heat Location Knee  concurrent with manual and exercise.     Ultrasound   Ultrasound Location medial knee,  distal Hamstrings   Ultrasound Parameters 1 Mhx, 1.2 watts /cm2,  100 % 5 minutes each   Ultrasound Goals Pain  Manual Therapy   Manual therapy comments tape patellar tracking,  mobs to patella.                  PT Education - 09/20/16 1804    Education provided Yes   Education Details self care   Person(s) Educated Patient   Methods Explanation;Demonstration   Comprehension Verbalized understanding          PT Short Term Goals - 09/06/16 1615      PT SHORT TERM GOAL #1   Title Pt will be I with initial HEP for knee flexibility and pain control.     Time 4   Period Weeks   Status New   Target Date 10/04/16     PT SHORT TERM GOAL #2   Title Pt will be able to walk with more ease, less visible lateral shift and more upright trunk.    Time 4   Period Weeks   Status New   Target Date 10/04/16     PT SHORT TERM GOAL #3   Title Swelling in LLE to resolve in calf to improve tightness, knee flexion at rest in a chair.    Time 4   Period Weeks   Status  New   Target Date 10/04/16           PT Long Term Goals - 09/06/16 1617      PT LONG TERM GOAL #1   Title FOTO score will improve to 50% or less impaired to demo functional improvement with walking, mobilty.    Time 8   Period Weeks   Status New   Target Date 11/01/16     PT LONG TERM GOAL #2   Title Pt will be able to work with pain in Knee, back <4/10 most of the time.    Time 8   Period Weeks   Status New   Target Date 11/01/16     PT LONG TERM GOAL #3   Title Pt will be able to report less (50%) stiffness in L knee upon standing after driving, sitting 43-32 min    Time 8   Period Weeks   Status New   Target Date 11/01/16     PT LONG TERM GOAL #4   Title Pt will be I with more advanced HEP for knee, hip, core    Time 8   Period Weeks   Target Date 11/01/16     PT LONG TERM GOAL #5   Title Pt will be able to squat, lift with good body mechanics and carry over for prevention of reinjury.    Time 8   Period Weeks   Status New   Target Date 11/01/16               Plan - 09/20/16 1804    Clinical Impression Statement Limp and pain decreased with today's session.  Patient was unablr to straighten her knee prior to session.  Trial of tape for tracking.  She is mildly reactive to laytex.  She will remove if it becomes irritating.  It was not applied directly to skin.   PT Next Visit Plan assess tolerance to exercises  continue  [place] this visit; stretching, check swelling and  assess tape/vaso (pt does not like cold)  posture in standing,  manual as needed distal medial hamstrings.  Consider hamstring curls,  weight shifting at wall. Add LAQ to HEP with pillow squeeze.    PT Home Exercise Plan ITB, hamstring and calf  stretching ,  quad set.   Consulted and Agree with Plan of Care Patient      Patient will benefit from skilled therapeutic intervention in order to improve the following deficits and impairments:     Visit Diagnosis: Localized edema  Stiffness of  left knee, not elsewhere classified  Muscle weakness (generalized)  Abnormal posture  Difficulty in walking, not elsewhere classified     Problem List Patient Active Problem List   Diagnosis Date Noted  . Anxiety state, unspecified 08/15/2013  . Unspecified hereditary and idiopathic peripheral neuropathy 08/15/2013  . Unspecified essential hypertension 08/15/2013  . Other and unspecified hyperlipidemia 08/15/2013  . Hyperlipidemia 08/15/2013    HARRIS,KAREN PTA 09/20/2016, 6:09 PM  [facility] [address] [place], [place], [address] Phone: [phone]   Fax:  [phone]  Name: Sharon Marsh MRN: [id number] Date of Birth: 12-11-1945

## 2016-09-22 ENCOUNTER — Ambulatory Visit: Payer: Medicare Other | Admitting: Physical Therapy

## 2016-09-22 DIAGNOSIS — M25662 Stiffness of left knee, not elsewhere classified: Secondary | ICD-10-CM | POA: Diagnosis not present

## 2016-09-22 DIAGNOSIS — R262 Difficulty in walking, not elsewhere classified: Secondary | ICD-10-CM | POA: Diagnosis not present

## 2016-09-22 DIAGNOSIS — R293 Abnormal posture: Secondary | ICD-10-CM | POA: Diagnosis not present

## 2016-09-22 DIAGNOSIS — M6281 Muscle weakness (generalized): Secondary | ICD-10-CM

## 2016-09-22 DIAGNOSIS — R600 Localized edema: Secondary | ICD-10-CM | POA: Diagnosis not present

## 2016-09-22 NOTE — Therapy (Signed)
[place], [place], [address] Phone: [phone]   Fax:  [phone]  Physical Therapy Treatment  Patient Details  Name: Sharon Marsh MRN: [id number] Date of Birth: 14-May-1945 Referring Provider: Dr. Ramonita Lab  Encounter Date: 09/22/2016      PT End of Session - 09/22/16 1250    Visit Number 6   Number of Visits 16   Date for PT Re-Evaluation 11/01/16   PT Start Time 1146   PT Stop Time 1240   PT Time Calculation (min) 54 min   Activity Tolerance Patient tolerated treatment well   Behavior During Therapy [facility] for tasks assessed/performed      Past Medical History:  Diagnosis Date  . GERD (gastroesophageal reflux disease)   . Hiatal hernia   . Hypertension   . Other and unspecified hyperlipidemia   . Personal history of vulvar dysplasia   . PONV (postoperative nausea and vomiting)     Past Surgical History:  Procedure Laterality Date  . ABDOMINAL HYSTERECTOMY    . APPENDECTOMY  1950  . THYROID SURGERY  1980  . VULVECTOMY N/A 06/22/2012   Procedure: WIDE EXCISION VULVECTOMY;  Surgeon: Allyn Kenner, DO;  Location: [facility];  Service: Gynecology;  Laterality: N/A;  wide local excision of vulva  . 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      Subjective Assessment - 09/22/16 1153    Subjective No pain this AM (1/10) now increased to 4/10-5/10 because ive been doing housework, standing (was working).  The tape really helped.     Currently in Pain? Yes   Pain Score 4    Pain Location Leg   Pain Orientation Left;Posterior   Pain Descriptors / Indicators Sore   Pain Type Chronic pain   Pain Onset More than a month ago   Pain Frequency Intermittent   Aggravating Factors  walking, working and standing   Pain Relieving Factors PT, heat , tape stretch            [facility] Adult PT Treatment/Exercise - 09/22/16 0001       Knee/Hip Exercises: Stretches   Active Hamstring Stretch Left;2 reps;30 seconds   ITB Stretch Left;3 reps;30 seconds   Piriformis Stretch Left;2 reps;30 seconds   Gastroc Stretch Left;3 reps;30 seconds   Soleus Stretch Left;3 reps;30 seconds     Knee/Hip Exercises: Seated   Long Arc Quad 20 reps   Long Arc Quad Limitations with ball squeeze     Knee/Hip Exercises: Supine   Short Arc [employer] 10 reps   Short Arc Quad Sets Limitations ball squeeze in hooklying    Hip Adduction Isometric Strengthening;Both;1 set   Bridges Strengthening;Both;1 set;20 reps   [employer] Limitations with ball    Straight Leg Raises Strengthening;Left;1 set;15 reps   Straight Leg Raise with External Rotation Strengthening;Left;1 set;10 reps     Ultrasound   Ultrasound Goals Pain     Manual Therapy   Manual therapy comments tape patellar tracking,  mobs to patella.                  PT Education - 09/22/16 1249    Education provided Yes   Education Details rationale for tape, VMO, alignment    Person(s) Educated Patient   Methods Explanation   Comprehension Verbalized understanding          PT Short Term Goals -  09/22/16 1159      PT SHORT TERM GOAL #1   Title Pt will be I with initial HEP for knee flexibility and pain control.     Status Achieved     PT SHORT TERM GOAL #2   Title Pt will be able to walk with more ease, less visible lateral shift and more upright trunk.    Baseline makes an effort, overall better standing tolerance    Status On-going     PT SHORT TERM GOAL #3   Title Swelling in LLE to resolve in calf to improve tightness, knee flexion at rest in a chair.    Baseline can swell but overall less    Status Partially Met           PT Long Term Goals - 09/06/16 1617      PT LONG TERM GOAL #1   Title FOTO score will improve to 50% or less impaired to demo functional improvement with walking, mobilty.    Time 8   Period Weeks   Status New   Target Date  11/01/16     PT LONG TERM GOAL #2   Title Pt will be able to work with pain in Knee, back <4/10 most of the time.    Time 8   Period Weeks   Status New   Target Date 11/01/16     PT LONG TERM GOAL #3   Title Pt will be able to report less (50%) stiffness in L knee upon standing after driving, sitting 11-94 min    Time 8   Period Weeks   Status New   Target Date 11/01/16     PT LONG TERM GOAL #4   Title Pt will be I with more advanced HEP for knee, hip, core    Time 8   Period Weeks   Target Date 11/01/16     PT LONG TERM GOAL #5   Title Pt will be able to squat, lift with good body mechanics and carry over for prevention of reinjury.    Time 8   Period Weeks   Status New   Target Date 11/01/16               Plan - 09/22/16 1250    Clinical Impression Statement Patient has pain with standing and working, however is becoming moreintermittent.  Pain seems more related to her back as it is post thigh and calf.  She had some locking when standing for calf stretch anterior medially.  Reapplied tape.  Making progress.    PT Next Visit Plan chekc new HEP (strength) and manual to medial distal hamstring, calf.  UE, Mcconnell tape    PT Home Exercise Plan ITB, hamstring and calf stretching ,  quad set, SLR LAQ and bridging , piriformis    Consulted and Agree with Plan of Care Patient      Patient will benefit from skilled therapeutic intervention in order to improve the following deficits and impairments:  Abnormal gait, Decreased activity tolerance, Decreased balance, Impaired flexibility, Postural dysfunction, Improper body mechanics, Decreased range of motion, Decreased mobility, Difficulty walking, Pain, Increased fascial restricitons, Decreased strength, Increased edema, Hypomobility  Visit Diagnosis: Localized edema  Stiffness of left knee, not elsewhere classified  Muscle weakness (generalized)  Difficulty in walking, not elsewhere classified  Abnormal  posture     Problem List Patient Active Problem List   Diagnosis Date Noted  . Anxiety state, unspecified 08/15/2013  . Unspecified hereditary and idiopathic  peripheral neuropathy 08/15/2013  . Unspecified essential hypertension 08/15/2013  . Other and unspecified hyperlipidemia 08/15/2013  . Hyperlipidemia 08/15/2013    PAA,JENNIFER 09/22/2016, 12:54 PM  [facility] [address] [place], [place], [address] Phone: [phone]   Fax:  [phone]  Name: Sharon Marsh MRN: [id number] Date of Birth: Feb 03, 1945   Raeford Razor, PT 09/22/16 12:55 PM Phone: [phone] Fax: [phone]

## 2016-09-27 ENCOUNTER — Encounter: Payer: Self-pay | Admitting: Physical Therapy

## 2016-09-27 ENCOUNTER — Ambulatory Visit: Payer: Medicare Other | Admitting: Physical Therapy

## 2016-09-27 DIAGNOSIS — R600 Localized edema: Secondary | ICD-10-CM

## 2016-09-27 DIAGNOSIS — M6281 Muscle weakness (generalized): Secondary | ICD-10-CM

## 2016-09-27 DIAGNOSIS — R293 Abnormal posture: Secondary | ICD-10-CM

## 2016-09-27 DIAGNOSIS — M25662 Stiffness of left knee, not elsewhere classified: Secondary | ICD-10-CM | POA: Diagnosis not present

## 2016-09-27 DIAGNOSIS — R262 Difficulty in walking, not elsewhere classified: Secondary | ICD-10-CM | POA: Diagnosis not present

## 2016-09-27 NOTE — Therapy (Signed)
[place], [place], [address] Phone: [phone]   Fax:  [phone]  Physical Therapy Treatment  Patient Details  Name: Sharon Marsh MRN: [id number] Date of Birth: 06-29-1945 Referring Provider: Dr. Ramonita Lab  Encounter Date: 09/27/2016      PT End of Session - 09/27/16 1805    Visit Number 7   Number of Visits 16   Date for PT Re-Evaluation 11/01/16   PT Start Time 1410   PT Stop Time 1500   PT Time Calculation (min) 50 min   Activity Tolerance Patient tolerated treatment well   Behavior During Therapy [facility] for tasks assessed/performed      Past Medical History:  Diagnosis Date  . GERD (gastroesophageal reflux disease)   . Hiatal hernia   . Hypertension   . Other and unspecified hyperlipidemia   . Personal history of vulvar dysplasia   . PONV (postoperative nausea and vomiting)     Past Surgical History:  Procedure Laterality Date  . ABDOMINAL HYSTERECTOMY    . APPENDECTOMY  1950  . THYROID SURGERY  1980  . VULVECTOMY N/A 06/22/2012   Procedure: WIDE EXCISION VULVECTOMY;  Surgeon: Allyn Kenner, DO;  Location: [facility];  Service: Gynecology;  Laterality: N/A;  wide local excision of vulva  . 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      Subjective Assessment - 09/27/16 1758    Subjective I have had a good day.  Pain went away after church last night on the sid eof my leg.    Pain anterior medial today.  Swelling is getting better.   Currently in Pain? Yes   Pain Score 4    Pain Location Knee   Pain Orientation Left;Anterior;Medial   Pain Descriptors / Indicators Sore   Pain Type Chronic pain   Pain Frequency Intermittent   Aggravating Factors  walking,  standing    Pain Relieving Factors PT,  heat,  [place],  stretch,  tape   Effect of Pain on Daily Activities limits walking                          [facility] Adult PT Treatment/Exercise - 09/27/16 0001      Knee/Hip Exercises: Stretches   Passive Hamstring Stretch 3 reps;30 seconds   Gastroc Stretch Limitations reviewed alternate stretch technique,  she is to do on the steps in the garage. to avoid pain.   Other Knee/Hip Stretches Standing back extension # x 10 seconds.  decreased calf pain.       Knee/Hip Exercises: Supine   Quad Sets 1 set;10 reps   Quad Sets Limitations improved contraction   Short Arc Quad Sets 1 set;10 reps   Short Arc Quad Sets Limitations ball squeeze   Straight Leg Raises Strengthening  10 x 2 sets   Straight Leg Raises Limitations cued to keep lower to table   Patellar Mobs Yes.  extended glides medial / lateral knee     Moist Heat Therapy   Number Minutes Moist Heat --  multiple minutes,  concurrent with [place] and supine exercises,    Moist Heat Location Knee     Ultrasound   Ultrasound Location medial knee   Ultrasound Parameters 8 minutes 100% 1.2 watts/cm2,  1 Mg Hz   Ultrasound Goals Pain     Manual Therapy   Manual  therapy comments tape patellar tracking,  mobs to patella.    also 50% kinesiotex tape patellar tendon to create space                  PT Short Term Goals - 09/27/16 1807      PT SHORT TERM GOAL #1   Title Pt will be I with initial HEP for knee flexibility and pain control.     Time 4   Period Weeks   Status Achieved     PT SHORT TERM GOAL #2   Title Pt will be able to walk with more ease, less visible lateral shift and more upright trunk.    Baseline varies ,  improvement trending.   Time 4   Period Weeks   Status On-going     PT SHORT TERM GOAL #3   Title Swelling in LLE to resolve in calf to improve tightness, knee flexion at rest in a chair.    Baseline can swell but overall less    Time 4   Period Weeks   Status Partially Met           PT Long Term Goals - 09/06/16 1617      PT LONG TERM GOAL #1   Title FOTO  score will improve to 50% or less impaired to demo functional improvement with walking, mobilty.    Time 8   Period Weeks   Status New   Target Date 11/01/16     PT LONG TERM GOAL #2   Title Pt will be able to work with pain in Knee, back <4/10 most of the time.    Time 8   Period Weeks   Status New   Target Date 11/01/16     PT LONG TERM GOAL #3   Title Pt will be able to report less (50%) stiffness in L knee upon standing after driving, sitting 15-17 min    Time 8   Period Weeks   Status New   Target Date 11/01/16     PT LONG TERM GOAL #4   Title Pt will be I with more advanced HEP for knee, hip, core    Time 8   Period Weeks   Target Date 11/01/16     PT LONG TERM GOAL #5   Title Pt will be able to squat, lift with good body mechanics and carry over for prevention of reinjury.    Time 8   Period Weeks   Status New   Target Date 11/01/16               Plan - 09/27/16 1806    Clinical Impression Statement Less pain at work and with vacuming at home today.  Quad shows increased contraction.  calf pain decreased with standing back extension.,  gait improved post standing back extension.   PT Next Visit Plan chekc new HEP (strength) and manual to medial distal hamstring, calf.  UE, Mcconnell tape    PT Home Exercise Plan ITB, hamstring and calf stretching ,  quad set, SLR LAQ and bridging , piriformis    Consulted and Agree with Plan of Care Patient      Patient will benefit from skilled therapeutic intervention in order to improve the following deficits and impairments:     Visit Diagnosis: Localized edema  Stiffness of left knee, not elsewhere classified  Muscle weakness (generalized)  Difficulty in walking, not elsewhere classified  Abnormal posture     Problem List Patient Active  Problem List   Diagnosis Date Noted  . Anxiety state, unspecified 08/15/2013  . Unspecified hereditary and idiopathic peripheral neuropathy 08/15/2013  . Unspecified  essential hypertension 08/15/2013  . Other and unspecified hyperlipidemia 08/15/2013  . Hyperlipidemia 08/15/2013    HARRIS,KAREN PTA 09/27/2016, 6:09 PM  [facility] [address] [place], [place], [address] Phone: [phone]   Fax:  [phone]  Name: Sharon Marsh MRN: [id number] Date of Birth: [birth year]

## 2016-09-29 ENCOUNTER — Ambulatory Visit: Payer: Medicare Other | Admitting: Physical Therapy

## 2016-09-29 DIAGNOSIS — M25662 Stiffness of left knee, not elsewhere classified: Secondary | ICD-10-CM | POA: Diagnosis not present

## 2016-09-29 DIAGNOSIS — R262 Difficulty in walking, not elsewhere classified: Secondary | ICD-10-CM

## 2016-09-29 DIAGNOSIS — M6281 Muscle weakness (generalized): Secondary | ICD-10-CM | POA: Diagnosis not present

## 2016-09-29 DIAGNOSIS — R293 Abnormal posture: Secondary | ICD-10-CM | POA: Diagnosis not present

## 2016-09-29 DIAGNOSIS — R600 Localized edema: Secondary | ICD-10-CM

## 2016-09-29 NOTE — Therapy (Signed)
[place], [place], [address] Phone: [phone]   Fax:  [phone]  Physical Therapy Treatment  Patient Details  Name: Sharon Marsh MRN: [id number] Date of Birth: 1945/02/11 Referring Provider: Dr. Ramonita Lab  Encounter Date: 09/29/2016      PT End of Session - 09/29/16 1237    Visit Number 8   Number of Visits 16   Date for PT Re-Evaluation 11/01/16   PT Start Time 1150   PT Stop Time 1243   PT Time Calculation (min) 53 min   Activity Tolerance Patient tolerated treatment well   Behavior During Therapy [facility] for tasks assessed/performed      Past Medical History:  Diagnosis Date  . GERD (gastroesophageal reflux disease)   . Hiatal hernia   . Hypertension   . Other and unspecified hyperlipidemia   . Personal history of vulvar dysplasia   . PONV (postoperative nausea and vomiting)     Past Surgical History:  Procedure Laterality Date  . ABDOMINAL HYSTERECTOMY    . APPENDECTOMY  1950  . THYROID SURGERY  1980  . VULVECTOMY N/A 06/22/2012   Procedure: WIDE EXCISION VULVECTOMY;  Surgeon: Allyn Kenner, DO;  Location: [facility];  Service: Gynecology;  Laterality: N/A;  wide local excision of vulva  . 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      Subjective Assessment - 09/29/16 1155    Subjective Had min to no pain 2 mornings in a row.  Overall LT leg pain 6/10, depends on how I move.    Currently in Pain? Yes   Pain Score 5    Pain Location Knee   Pain Orientation Left;Medial;Posterior   Pain Descriptors / Indicators Sore   Pain Type Chronic pain   Pain Onset More than a month ago   Pain Frequency Intermittent   Aggravating Factors  walking , stepping wrong    Pain Relieving Factors therapy, rest ,heat,              [facility] Adult PT Treatment/Exercise - 09/29/16 0001      Lumbar Exercises: Prone   Other  Prone Lumbar Exercises POE 5 x 30 sec      Knee/Hip Exercises: Stretches   Active Hamstring Stretch Left;2 reps   ITB Stretch Left;2 reps     Knee/Hip Exercises: Seated   Long Arc Quad Strengthening;Left;1 set;20 reps   Long Arc Quad Weight 3 lbs.   Long [employer] Limitations with ball squeeze   Sit to [employer] 1 set;10 reps;without UE support  with ball pain, better with hip ER/ABD green      Knee/Hip Exercises: Supine   Bridges Strengthening;Both;1 set;10 reps   Bridges Limitations with band pressing out    Bridges with Clamshell Strengthening;Both;1 set;10 reps     Moist Heat Therapy   Number Minutes Moist Heat 10 Minutes   Moist Heat Location Knee     Manual Therapy   Manual Therapy Soft tissue mobilization;Myofascial release   Soft tissue mobilization L hamstring and gastroc    Myofascial Release L post LE                 PT Education - 09/29/16 1237    Education provided Yes   Education Details prone press up, POE for reducing pain in calf    Person(s) Educated Patient   Methods Explanation;Demonstration   Comprehension  Verbalized understanding;Returned demonstration          PT Short Term Goals - 09/27/16 1807      PT SHORT TERM GOAL #1   Title Pt will be I with initial HEP for knee flexibility and pain control.     Time 4   Period Weeks   Status Achieved     PT SHORT TERM GOAL #2   Title Pt will be able to walk with more ease, less visible lateral shift and more upright trunk.    Baseline varies ,  improvement trending.   Time 4   Period Weeks   Status On-going     PT SHORT TERM GOAL #3   Title Swelling in LLE to resolve in calf to improve tightness, knee flexion at rest in a chair.    Baseline can swell but overall less    Time 4   Period Weeks   Status Partially Met           PT Long Term Goals - 09/06/16 1617      PT LONG TERM GOAL #1   Title FOTO score will improve to 50% or less impaired to demo functional improvement with walking,  mobilty.    Time 8   Period Weeks   Status New   Target Date 11/01/16     PT LONG TERM GOAL #2   Title Pt will be able to work with pain in Knee, back <4/10 most of the time.    Time 8   Period Weeks   Status New   Target Date 11/01/16     PT LONG TERM GOAL #3   Title Pt will be able to report less (50%) stiffness in L knee upon standing after driving, sitting 75-10 min    Time 8   Period Weeks   Status New   Target Date 11/01/16     PT LONG TERM GOAL #4   Title Pt will be I with more advanced HEP for knee, hip, core    Time 8   Period Weeks   Target Date 11/01/16     PT LONG TERM GOAL #5   Title Pt will be able to squat, lift with good body mechanics and carry over for prevention of reinjury.    Time 8   Period Weeks   Status New   Target Date 11/01/16               Plan - 09/29/16 1237    Clinical Impression Statement Worked to reduce pain posterior knee today, including prone extension.  She is improving but still has pain intermittently.  Sit to stand better with hip ER and ABD than with adduction.    PT Next Visit Plan hip and core strength. Did prone help calf, repeat manual, stretch to knee    PT Home Exercise Plan ITB, hamstring and calf stretching ,  quad set, SLR LAQ and bridging , piriformis ,prone ext    Consulted and Agree with Plan of Care Patient      Patient will benefit from skilled therapeutic intervention in order to improve the following deficits and impairments:  Abnormal gait, Decreased activity tolerance, Decreased balance, Impaired flexibility, Postural dysfunction, Improper body mechanics, Decreased range of motion, Decreased mobility, Difficulty walking, Pain, Increased fascial restricitons, Decreased strength, Increased edema, Hypomobility  Visit Diagnosis: Localized edema  Stiffness of left knee, not elsewhere classified  Muscle weakness (generalized)  Difficulty in walking, not elsewhere classified  Abnormal  posture  Problem List Patient Active Problem List   Diagnosis Date Noted  . Anxiety state, unspecified 08/15/2013  . Unspecified hereditary and idiopathic peripheral neuropathy 08/15/2013  . Unspecified essential hypertension 08/15/2013  . Other and unspecified hyperlipidemia 08/15/2013  . Hyperlipidemia 08/15/2013    Lamisha Roussell 09/29/2016, 12:43 PM  [facility] [address] [place], [place], [address] Phone: [phone]   Fax:  [phone]  Name: SANNA PORCARO MRN: [id number] Date of Birth: [birth year]  Raeford Razor, PT 09/29/16 12:43 PM Phone: [phone] Fax: [phone]

## 2016-09-30 DIAGNOSIS — I10 Essential (primary) hypertension: Secondary | ICD-10-CM | POA: Diagnosis not present

## 2016-09-30 DIAGNOSIS — Z9181 History of falling: Secondary | ICD-10-CM | POA: Diagnosis not present

## 2016-09-30 DIAGNOSIS — E784 Other hyperlipidemia: Secondary | ICD-10-CM | POA: Diagnosis not present

## 2016-10-03 ENCOUNTER — Ambulatory Visit: Payer: Medicare Other | Attending: Orthopedic Surgery | Admitting: Physical Therapy

## 2016-10-03 DIAGNOSIS — R293 Abnormal posture: Secondary | ICD-10-CM | POA: Diagnosis not present

## 2016-10-03 DIAGNOSIS — R262 Difficulty in walking, not elsewhere classified: Secondary | ICD-10-CM | POA: Diagnosis not present

## 2016-10-03 DIAGNOSIS — R600 Localized edema: Secondary | ICD-10-CM | POA: Diagnosis not present

## 2016-10-03 DIAGNOSIS — M6281 Muscle weakness (generalized): Secondary | ICD-10-CM | POA: Insufficient documentation

## 2016-10-03 DIAGNOSIS — M25662 Stiffness of left knee, not elsewhere classified: Secondary | ICD-10-CM | POA: Diagnosis not present

## 2016-10-03 DIAGNOSIS — G8929 Other chronic pain: Secondary | ICD-10-CM | POA: Insufficient documentation

## 2016-10-03 DIAGNOSIS — M545 Low back pain: Secondary | ICD-10-CM | POA: Insufficient documentation

## 2016-10-03 NOTE — Therapy (Signed)
[place], [place], [address] Phone: [phone]   Fax:  [phone]  Physical Therapy Treatment  Patient Details  Name: Sharon Marsh MRN: [id number] Date of Birth: Nov 21, 1945 Referring Provider: Dr. Ramonita Lab  Encounter Date: 10/03/2016      PT End of Session - 10/03/16 1257    Visit Number 9   Number of Visits 16   Date for PT Re-Evaluation 11/01/16   PT Start Time 3532   PT Stop Time 1341   PT Time Calculation (min) 48 min      Past Medical History:  Diagnosis Date  . GERD (gastroesophageal reflux disease)   . Hiatal hernia   . Hypertension   . Other and unspecified hyperlipidemia   . Personal history of vulvar dysplasia   . PONV (postoperative nausea and vomiting)     Past Surgical History:  Procedure Laterality Date  . ABDOMINAL HYSTERECTOMY    . APPENDECTOMY  1950  . THYROID SURGERY  1980  . VULVECTOMY N/A 06/22/2012   Procedure: WIDE EXCISION VULVECTOMY;  Surgeon: Allyn Kenner, DO;  Location: [facility];  Service: Gynecology;  Laterality: N/A;  wide local excision of vulva  . 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      Subjective Assessment - 10/03/16 1256    Subjective I can see some improvement    Currently in Pain? Yes   Pain Score 3    Pain Location Knee   Pain Orientation Left;Medial;Posterior   Pain Descriptors / Indicators Aching                         [facility] Adult PT Treatment/Exercise - 10/03/16 0001      Lumbar Exercises: Prone   Other Prone Lumbar Exercises POE 5 x 30 sec      Knee/Hip Exercises: Stretches   Active Hamstring Stretch Left;2 reps   ITB Stretch Left;2 reps     Knee/Hip Exercises: Seated   Long Arc Quad Strengthening;Left;1 set;20 reps  began to have pain in left hip do disc.    Long Arc Quad Weight 3 lbs.   Long [employer] Limitations with ball squeeze    Other Seated Knee/Hip Exercises supine clam green x 20    Sit to Sand 1 set;10 reps;without UE support   with hip ER/ABD green      Knee/Hip Exercises: Supine   Short Arc Quad Sets 1 set;10 reps   Short Arc Quad Sets Limitations ball squeeze    Bridges Strengthening;Both;1 set;10 reps   Bridges Limitations with band pressing out      Knee/Hip Exercises: Prone   Hamstring Curl 10 reps   Other Prone Exercises prone quad stretch 3 x 30 sec with strap      Moist Heat Therapy   Number Minutes Moist Heat 10 Minutes   Moist Heat Location Knee     Manual Therapy   Soft tissue mobilization L hamstring and gastroc , ITB   Myofascial Release --                  PT Short Term Goals - 09/27/16 1807      PT SHORT TERM GOAL #1   Title Pt will be I with initial HEP for knee flexibility and pain control.     Time 4   Period Weeks   Status  Achieved     PT SHORT TERM GOAL #2   Title Pt will be able to walk with more ease, less visible lateral shift and more upright trunk.    Baseline varies ,  improvement trending.   Time 4   Period Weeks   Status On-going     PT SHORT TERM GOAL #3   Title Swelling in LLE to resolve in calf to improve tightness, knee flexion at rest in a chair.    Baseline can swell but overall less    Time 4   Period Weeks   Status Partially Met           PT Long Term Goals - 09/06/16 1617      PT LONG TERM GOAL #1   Title FOTO score will improve to 50% or less impaired to demo functional improvement with walking, mobilty.    Time 8   Period Weeks   Status New   Target Date 11/01/16     PT LONG TERM GOAL #2   Title Pt will be able to work with pain in Knee, back <4/10 most of the time.    Time 8   Period Weeks   Status New   Target Date 11/01/16     PT LONG TERM GOAL #3   Title Pt will be able to report less (50%) stiffness in L knee upon standing after driving, sitting 75-64 min    Time 8   Period Weeks   Status New   Target Date  11/01/16     PT LONG TERM GOAL #4   Title Pt will be I with more advanced HEP for knee, hip, core    Time 8   Period Weeks   Target Date 11/01/16     PT LONG TERM GOAL #5   Title Pt will be able to squat, lift with good body mechanics and carry over for prevention of reinjury.    Time 8   Period Weeks   Status New   Target Date 11/01/16               Plan - 10/03/16 1308    Clinical Impression Statement Pt reports she can see a difference in her pain. Hip and calf pain much better with back extension exercises. She thought the manual to posterior calf  and knee was helpful last visit. Cotinued knee and hip strengthening with increasing hip and calf pain, resolved by prone in elbows and manual.    PT Next Visit Plan hip and core strength. Did prone help calf, repeat manual, stretch to knee    PT Home Exercise Plan ITB, hamstring and calf stretching ,  quad set, SLR LAQ and bridging , piriformis ,prone ext    Consulted and Agree with Plan of Care Patient      Patient will benefit from skilled therapeutic intervention in order to improve the following deficits and impairments:  Abnormal gait, Decreased activity tolerance, Decreased balance, Impaired flexibility, Postural dysfunction, Improper body mechanics, Decreased range of motion, Decreased mobility, Difficulty walking, Pain, Increased fascial restricitons, Decreased strength, Increased edema, Hypomobility  Visit Diagnosis: Localized edema  Stiffness of left knee, not elsewhere classified  Muscle weakness (generalized)  Difficulty in walking, not elsewhere classified  Abnormal posture     Problem List Patient Active Problem List   Diagnosis Date Noted  . Anxiety state, unspecified 08/15/2013  . Unspecified hereditary and idiopathic peripheral neuropathy 08/15/2013  . Unspecified essential hypertension 08/15/2013  . Other and  unspecified hyperlipidemia 08/15/2013  . Hyperlipidemia 08/15/2013    Dorene Ar,  PTA 10/03/2016, 1:36 PM  [facility] [address] [place], [place], [address] Phone: [phone]   Fax:  [phone]  Name: Sharon Marsh MRN: [id number] Date of Birth: 23-Oct-1945

## 2016-10-07 ENCOUNTER — Ambulatory Visit: Payer: Medicare Other | Admitting: Physical Therapy

## 2016-10-07 DIAGNOSIS — R293 Abnormal posture: Secondary | ICD-10-CM | POA: Diagnosis not present

## 2016-10-07 DIAGNOSIS — M25662 Stiffness of left knee, not elsewhere classified: Secondary | ICD-10-CM | POA: Diagnosis not present

## 2016-10-07 DIAGNOSIS — R600 Localized edema: Secondary | ICD-10-CM | POA: Diagnosis not present

## 2016-10-07 DIAGNOSIS — R262 Difficulty in walking, not elsewhere classified: Secondary | ICD-10-CM

## 2016-10-07 DIAGNOSIS — M545 Low back pain: Secondary | ICD-10-CM | POA: Diagnosis not present

## 2016-10-07 DIAGNOSIS — M6281 Muscle weakness (generalized): Secondary | ICD-10-CM

## 2016-10-07 NOTE — Therapy (Signed)
[place], [place], [address] Phone: [phone]   Fax:  [phone]  Physical Therapy Treatment  Patient Details  Name: Sharon Marsh MRN: [id number] Date of Birth: 1945-06-14 Referring Provider: Dr. Ramonita Lab  Encounter Date: 10/07/2016      PT End of Session - 10/07/16 1206    Visit Number 10   Number of Visits 16   Date for PT Re-Evaluation 11/01/16   PT Start Time 1146   PT Stop Time 2751   PT Time Calculation (min) 49 min      Past Medical History:  Diagnosis Date  . GERD (gastroesophageal reflux disease)   . Hiatal hernia   . Hypertension   . Other and unspecified hyperlipidemia   . Personal history of vulvar dysplasia   . PONV (postoperative nausea and vomiting)     Past Surgical History:  Procedure Laterality Date  . ABDOMINAL HYSTERECTOMY    . APPENDECTOMY  1950  . THYROID SURGERY  1980  . VULVECTOMY N/A 06/22/2012   Procedure: WIDE EXCISION VULVECTOMY;  Surgeon: Allyn Kenner, DO;  Location: [facility];  Service: Gynecology;  Laterality: N/A;  wide local excision of vulva  . 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      Subjective Assessment - 10/07/16 1149    Subjective Today my knee is hurting. I didnt do anything really to aggravate it.    Currently in Pain? Yes   Pain Score 6    Pain Location Knee   Pain Orientation Anterior;Left   Pain Descriptors / Indicators Aching   Aggravating Factors  walking    Pain Relieving Factors rest, heat                          [facility] Adult PT Treatment/Exercise - 10/07/16 0001      Knee/Hip Exercises: Stretches   Active Hamstring Stretch Left;2 reps   ITB Stretch Left;2 reps     Knee/Hip Exercises: Seated   Long Arc Quad Strengthening;Left;1 set;20 reps   Long Arc Quad Weight 3 lbs.   Sit to Sand 1 set;10 reps;without UE support   with hip  ER/ABD green      Knee/Hip Exercises: Supine   Short Arc Quad Sets 1 set;10 reps   Short Arc Quad Sets Limitations ball squeeze    Single Leg Bridge 10 reps     Knee/Hip Exercises: Sidelying   Hip ABduction 20 reps   Clams x 10 left, reverse x 10      Knee/Hip Exercises: Prone   Hamstring Curl 20 reps   Hamstring Curl Limitations 3#   Straight Leg Raises Limitations prone donkey kick 3# x 10    Other Prone Exercises prone quad stretch 3 x 30 sec with strap      Moist Heat Therapy   Number Minutes Moist Heat 10 Minutes  posterior knee   Moist Heat Location Knee                  PT Short Term Goals - 09/27/16 1807      PT SHORT TERM GOAL #1   Title Pt will be I with initial HEP for knee flexibility and pain control.     Time 4   Period Weeks   Status Achieved     PT SHORT TERM GOAL #2   Title  Pt will be able to walk with more ease, less visible lateral shift and more upright trunk.    Baseline varies ,  improvement trending.   Time 4   Period Weeks   Status On-going     PT SHORT TERM GOAL #3   Title Swelling in LLE to resolve in calf to improve tightness, knee flexion at rest in a chair.    Baseline can swell but overall less    Time 4   Period Weeks   Status Partially Met           PT Long Term Goals - 09/06/16 1617      PT LONG TERM GOAL #1   Title FOTO score will improve to 50% or less impaired to demo functional improvement with walking, mobilty.    Time 8   Period Weeks   Status New   Target Date 11/01/16     PT LONG TERM GOAL #2   Title Pt will be able to work with pain in Knee, back <4/10 most of the time.    Time 8   Period Weeks   Status New   Target Date 11/01/16     PT LONG TERM GOAL #3   Title Pt will be able to report less (50%) stiffness in L knee upon standing after driving, sitting 61-60 min    Time 8   Period Weeks   Status New   Target Date 11/01/16     PT LONG TERM GOAL #4   Title Pt will be I with more advanced HEP  for knee, hip, core    Time 8   Period Weeks   Target Date 11/01/16     PT LONG TERM GOAL #5   Title Pt will be able to squat, lift with good body mechanics and carry over for prevention of reinjury.    Time 8   Period Weeks   Status New   Target Date 11/01/16               Plan - 10/07/16 1208    Clinical Impression Statement Pt reports she has been having less pain and may have over done it yesterday. She enters with antalgic gait and no AD. Recommended she use SPC when in pain and limping. No additional goals met due to increased pain today.    PT Next Visit Plan hip and core strength. Did prone help calf, repeat manual, stretch to knee    PT Home Exercise Plan ITB, hamstring and calf stretching ,  quad set, SLR LAQ and bridging , piriformis ,prone ext    Consulted and Agree with Plan of Care Patient      Patient will benefit from skilled therapeutic intervention in order to improve the following deficits and impairments:  Abnormal gait, Decreased activity tolerance, Decreased balance, Impaired flexibility, Postural dysfunction, Improper body mechanics, Decreased range of motion, Decreased mobility, Difficulty walking, Pain, Increased fascial restricitons, Decreased strength, Increased edema, Hypomobility  Visit Diagnosis: Localized edema  Stiffness of left knee, not elsewhere classified  Abnormal posture  Difficulty in walking, not elsewhere classified  Muscle weakness (generalized)     Problem List Patient Active Problem List   Diagnosis Date Noted  . Anxiety state, unspecified 08/15/2013  . Unspecified hereditary and idiopathic peripheral neuropathy 08/15/2013  . Unspecified essential hypertension 08/15/2013  . Other and unspecified hyperlipidemia 08/15/2013  . Hyperlipidemia 08/15/2013    Dorene Ar, PTA 10/07/2016, 12:29 PM  [facility] [address]  [place], [place], [address] Phone:  [phone]   Fax:  [phone]  Name: RUPA LAGAN MRN: [id number] Date of Birth: October 08, 1945

## 2016-10-10 DIAGNOSIS — H26493 Other secondary cataract, bilateral: Secondary | ICD-10-CM | POA: Diagnosis not present

## 2016-10-11 ENCOUNTER — Ambulatory Visit: Payer: Medicare Other | Admitting: Physical Therapy

## 2016-10-11 DIAGNOSIS — R600 Localized edema: Secondary | ICD-10-CM | POA: Diagnosis not present

## 2016-10-11 DIAGNOSIS — R293 Abnormal posture: Secondary | ICD-10-CM | POA: Diagnosis not present

## 2016-10-11 DIAGNOSIS — M6281 Muscle weakness (generalized): Secondary | ICD-10-CM | POA: Diagnosis not present

## 2016-10-11 DIAGNOSIS — M25662 Stiffness of left knee, not elsewhere classified: Secondary | ICD-10-CM | POA: Diagnosis not present

## 2016-10-11 DIAGNOSIS — R262 Difficulty in walking, not elsewhere classified: Secondary | ICD-10-CM

## 2016-10-11 DIAGNOSIS — M545 Low back pain: Secondary | ICD-10-CM | POA: Diagnosis not present

## 2016-10-11 NOTE — Therapy (Signed)
[place], [place], [address] Phone: [phone]   Fax:  [phone]  Physical Therapy Treatment  Patient Details  Name: Sharon Marsh MRN: [id number] Date of Birth: [birth year] Referring Provider: Dr. Ramonita Lab  Encounter Date: 10/11/2016      PT End of Session - 10/11/16 1329    Visit Number 11   Number of Visits 16   Date for PT Re-Evaluation 11/01/16   PT Start Time 0130   PT Stop Time 0215   PT Time Calculation (min) 45 min      Past Medical History:  Diagnosis Date  . GERD (gastroesophageal reflux disease)   . Hiatal hernia   . Hypertension   . Other and unspecified hyperlipidemia   . Personal history of vulvar dysplasia   . PONV (postoperative nausea and vomiting)     Past Surgical History:  Procedure Laterality Date  . ABDOMINAL HYSTERECTOMY    . APPENDECTOMY  1950  . THYROID SURGERY  1980  . VULVECTOMY N/A 06/22/2012   Procedure: WIDE EXCISION VULVECTOMY;  Surgeon: Allyn Kenner, DO;  Location: [facility];  Service: Gynecology;  Laterality: N/A;  wide local excision of vulva  . 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      Subjective Assessment - 10/11/16 1329    Subjective Knee is better. I helped clean and pack for my grandson who is moving.    Aggravating Factors  walking, turning wrong    Pain Relieving Factors restm, heat                         [facility] Adult PT Treatment/Exercise - 10/11/16 0001      Knee/Hip Exercises: Stretches   Active Hamstring Stretch Left;2 reps   Active Hamstring Stretch Limitations strap   ITB Stretch Left;2 reps     Knee/Hip Exercises: Aerobic   Stationary Bike L2 x 5 minutes      Knee/Hip Exercises: Standing   Other Standing Knee Exercises tandem trials, tandem gait forward, side stepping, retro stepping     Knee/Hip Exercises: Seated   Sit to Sand  1 set;10 reps;without UE support     Modalities   Modalities Iontophoresis     Ultrasound   Ultrasound Location medial knee   Ultrasound Parameters pulsed 1.2 w/cm2 1 mhz   Ultrasound Goals Pain     Iontophoresis   Type of Iontophoresis Dexamethasone   Location medial knee, disatal    Dose 58ml   Time 6 hours                   PT Short Term Goals - 10/11/16 1455      PT SHORT TERM GOAL #1   Title Pt will be I with initial HEP for knee flexibility and pain control.     Time 4   Period Weeks   Status Achieved     PT SHORT TERM GOAL #2   Title Pt will be able to walk with more ease, less visible lateral shift and more upright trunk.    Baseline varies ,  improvement trending.   Time 4   Period Weeks   Status On-going     PT SHORT TERM GOAL #3   Title Swelling in LLE to resolve in calf to improve tightness, knee flexion at rest in a chair.  Time 4   Period Weeks   Status Achieved           PT Long Term Goals - 09/06/16 1617      PT LONG TERM GOAL #1   Title FOTO score will improve to 50% or less impaired to demo functional improvement with walking, mobilty.    Time 8   Period Weeks   Status New   Target Date 11/01/16     PT LONG TERM GOAL #2   Title Pt will be able to work with pain in Knee, back <4/10 most of the time.    Time 8   Period Weeks   Status New   Target Date 11/01/16     PT LONG TERM GOAL #3   Title Pt will be able to report less (50%) stiffness in L knee upon standing after driving, sitting 19-14 min    Time 8   Period Weeks   Status New   Target Date 11/01/16     PT LONG TERM GOAL #4   Title Pt will be I with more advanced HEP for knee, hip, core    Time 8   Period Weeks   Target Date 11/01/16     PT LONG TERM GOAL #5   Title Pt will be able to squat, lift with good body mechanics and carry over for prevention of reinjury.    Time 8   Period Weeks   Status New   Target Date 11/01/16               Plan -  10/11/16 1440    Clinical Impression Statement Pt reports she did well helping her grandson pack his home this weekend. She has more pain in her back than her knee. She notes less stiffness in knee after driving/sitting not yet consistent. Progressing toward LTG. Worked on balance today with difficulty in tandem and SLS causing increased knee pain.    PT Next Visit Plan hip and core strength. Did prone help calf, repeat manual, stretch to knee    PT Home Exercise Plan ITB, hamstring and calf stretching ,  quad set, SLR LAQ and bridging , piriformis ,prone ext    Consulted and Agree with Plan of Care Patient      Patient will benefit from skilled therapeutic intervention in order to improve the following deficits and impairments:  Abnormal gait, Decreased activity tolerance, Decreased balance, Impaired flexibility, Postural dysfunction, Improper body mechanics, Decreased range of motion, Decreased mobility, Difficulty walking, Pain, Increased fascial restricitons, Decreased strength, Increased edema, Hypomobility  Visit Diagnosis: Localized edema  Stiffness of left knee, not elsewhere classified  Abnormal posture  Difficulty in walking, not elsewhere classified  Muscle weakness (generalized)     Problem List Patient Active Problem List   Diagnosis Date Noted  . Anxiety state, unspecified 08/15/2013  . Unspecified hereditary and idiopathic peripheral neuropathy 08/15/2013  . Unspecified essential hypertension 08/15/2013  . Other and unspecified hyperlipidemia 08/15/2013  . Hyperlipidemia 08/15/2013    Dorene Ar, PTA 10/11/2016, 2:59 PM  [facility] [address] [place], [place], [address] Phone: [phone]   Fax:  [phone]  Name: Sharon Marsh MRN: [id number] Date of Birth: 10/27/1945

## 2016-10-14 ENCOUNTER — Ambulatory Visit: Payer: Medicare Other | Admitting: Physical Therapy

## 2016-10-14 DIAGNOSIS — R293 Abnormal posture: Secondary | ICD-10-CM | POA: Diagnosis not present

## 2016-10-14 DIAGNOSIS — M25662 Stiffness of left knee, not elsewhere classified: Secondary | ICD-10-CM

## 2016-10-14 DIAGNOSIS — R600 Localized edema: Secondary | ICD-10-CM

## 2016-10-14 DIAGNOSIS — M6281 Muscle weakness (generalized): Secondary | ICD-10-CM | POA: Diagnosis not present

## 2016-10-14 DIAGNOSIS — R262 Difficulty in walking, not elsewhere classified: Secondary | ICD-10-CM | POA: Diagnosis not present

## 2016-10-14 DIAGNOSIS — M545 Low back pain: Secondary | ICD-10-CM | POA: Diagnosis not present

## 2016-10-14 NOTE — Therapy (Signed)
[place], [place], [address] Phone: [phone]   Fax:  [phone]  Physical Therapy Treatment  Patient Details  Name: Sharon Marsh MRN: [id number] Date of Birth: 08/20/1945 Referring Provider: Dr. Ramonita Lab  Encounter Date: 10/14/2016      PT End of Session - 10/14/16 1028    Visit Number 12   Number of Visits 16   Date for PT Re-Evaluation 11/01/16   PT Start Time 1024   PT Stop Time 1113   PT Time Calculation (min) 49 min      Past Medical History:  Diagnosis Date  . GERD (gastroesophageal reflux disease)   . Hiatal hernia   . Hypertension   . Other and unspecified hyperlipidemia   . Personal history of vulvar dysplasia   . PONV (postoperative nausea and vomiting)     Past Surgical History:  Procedure Laterality Date  . ABDOMINAL HYSTERECTOMY    . APPENDECTOMY  1950  . THYROID SURGERY  1980  . VULVECTOMY N/A 06/22/2012   Procedure: WIDE EXCISION VULVECTOMY;  Surgeon: Allyn Kenner, DO;  Location: [facility];  Service: Gynecology;  Laterality: N/A;  wide local excision of vulva  . 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      Subjective Assessment - 10/14/16 1026    Subjective Getting sharp pains on the inside of knee   Currently in Pain? Yes   Pain Score 3   higher intermittent stabbing   Pain Location Knee   Pain Orientation Left;Anterior   Pain Descriptors / Indicators Stabbing                         [facility] Adult PT Treatment/Exercise - 10/14/16 0001      Knee/Hip Exercises: Aerobic   Stationary Bike L3 x 5 minutes     Knee/Hip Exercises: Standing   Other Standing Knee Exercises hip hike right x 10 , 3 way hip bilateral with light touch only, pain with Left weight bearing    Other Standing Knee Exercises tandem trials, tandem gait forward, side stepping (added yellow band ) , retro  stepping     Knee/Hip Exercises: Supine   Bridges Strengthening;Both;1 set;10 reps   Bridges Limitations with band pressing out    Other Supine Knee/Hip Exercises supine clam shell with green band      Knee/Hip Exercises: Sidelying   Hip ABduction 20 reps   Clams x 20 left, reverse x 20      Knee/Hip Exercises: Prone   Hamstring Curl 20 reps   Hamstring Curl Limitations 3#   Straight Leg Raises Limitations prone donkey kick 3# x 10      Iontophoresis   Type of Iontophoresis Dexamethasone   Location medial knee, disatal    Dose 72ml   Time 6 hours                   PT Short Term Goals - 10/11/16 1455      PT SHORT TERM GOAL #1   Title Pt will be I with initial HEP for knee flexibility and pain control.     Time 4   Period Weeks   Status Achieved     PT SHORT TERM GOAL #2   Title Pt will be able to walk with more ease, less visible lateral shift and more upright trunk.  Baseline varies ,  improvement trending.   Time 4   Period Weeks   Status On-going     PT SHORT TERM GOAL #3   Title Swelling in LLE to resolve in calf to improve tightness, knee flexion at rest in a chair.    Time 4   Period Weeks   Status Achieved           PT Long Term Goals - 09/06/16 1617      PT LONG TERM GOAL #1   Title FOTO score will improve to 50% or less impaired to demo functional improvement with walking, mobilty.    Time 8   Period Weeks   Status New   Target Date 11/01/16     PT LONG TERM GOAL #2   Title Pt will be able to work with pain in Knee, back <4/10 most of the time.    Time 8   Period Weeks   Status New   Target Date 11/01/16     PT LONG TERM GOAL #3   Title Pt will be able to report less (50%) stiffness in L knee upon standing after driving, sitting 18-29 min    Time 8   Period Weeks   Status New   Target Date 11/01/16     PT LONG TERM GOAL #4   Title Pt will be I with more advanced HEP for knee, hip, core    Time 8   Period Weeks   Target  Date 11/01/16     PT LONG TERM GOAL #5   Title Pt will be able to squat, lift with good body mechanics and carry over for prevention of reinjury.    Time 8   Period Weeks   Status New   Target Date 11/01/16               Plan - 10/14/16 1041    Clinical Impression Statement Trendelenberg weakness noted on left. Pt would like to add lumbar and hip to POC. Set her up for her re-evaluation next visit.    PT Next Visit Plan ERO-back/ hip ; hip and core strength. Did prone help calf, repeat manual, stretch to knee    PT Home Exercise Plan ITB, hamstring and calf stretching ,  quad set, SLR LAQ and bridging , piriformis ,prone ext    Consulted and Agree with Plan of Care Patient      Patient will benefit from skilled therapeutic intervention in order to improve the following deficits and impairments:  Abnormal gait, Decreased activity tolerance, Decreased balance, Impaired flexibility, Postural dysfunction, Improper body mechanics, Decreased range of motion, Decreased mobility, Difficulty walking, Pain, Increased fascial restricitons, Decreased strength, Increased edema, Hypomobility  Visit Diagnosis: Localized edema  Stiffness of left knee, not elsewhere classified  Abnormal posture  Difficulty in walking, not elsewhere classified  Muscle weakness (generalized)     Problem List Patient Active Problem List   Diagnosis Date Noted  . Anxiety state, unspecified 08/15/2013  . Unspecified hereditary and idiopathic peripheral neuropathy 08/15/2013  . Unspecified essential hypertension 08/15/2013  . Other and unspecified hyperlipidemia 08/15/2013  . Hyperlipidemia 08/15/2013    Dorene Ar, PTA 10/14/2016, 11:16 AM  [facility] [address] [place], [place], [address] Phone: [phone]   Fax:  [phone]  Name: MISHEEL GOWANS MRN: [id number] Date of Birth: 01-11-1945

## 2016-10-17 ENCOUNTER — Encounter: Payer: Medicare Other | Admitting: Physical Therapy

## 2016-10-18 ENCOUNTER — Ambulatory Visit: Payer: Medicare Other | Admitting: Physical Therapy

## 2016-10-18 ENCOUNTER — Encounter: Payer: Self-pay | Admitting: Physical Therapy

## 2016-10-18 DIAGNOSIS — R262 Difficulty in walking, not elsewhere classified: Secondary | ICD-10-CM

## 2016-10-18 DIAGNOSIS — R600 Localized edema: Secondary | ICD-10-CM | POA: Diagnosis not present

## 2016-10-18 DIAGNOSIS — M545 Low back pain, unspecified: Secondary | ICD-10-CM

## 2016-10-18 DIAGNOSIS — M6281 Muscle weakness (generalized): Secondary | ICD-10-CM

## 2016-10-18 DIAGNOSIS — G8929 Other chronic pain: Secondary | ICD-10-CM

## 2016-10-18 DIAGNOSIS — R293 Abnormal posture: Secondary | ICD-10-CM

## 2016-10-18 DIAGNOSIS — M25662 Stiffness of left knee, not elsewhere classified: Secondary | ICD-10-CM | POA: Diagnosis not present

## 2016-10-18 NOTE — Therapy (Signed)
[place], [place], [address] Phone: [phone]   Fax:  [phone]  Physical Therapy Treatment/Renewal Patient Details  Name: Sharon Marsh MRN: [id number] Date of Birth: 07/26/1945 Referring Provider: Dr. Ramonita Lab  Encounter Date: 10/18/2016      PT End of Session - 10/18/16 1154    Visit Number 13   Number of Visits 25   Date for PT Re-Evaluation 11/29/16   PT Start Time 3220   PT Stop Time 1240   PT Time Calculation (min) 55 min   Activity Tolerance Patient tolerated treatment well   Behavior During Therapy [facility] for tasks assessed/performed      Past Medical History:  Diagnosis Date  . GERD (gastroesophageal reflux disease)   . Hiatal hernia   . Hypertension   . Other and unspecified hyperlipidemia   . Personal history of vulvar dysplasia   . PONV (postoperative nausea and vomiting)     Past Surgical History:  Procedure Laterality Date  . ABDOMINAL HYSTERECTOMY    . APPENDECTOMY  1950  . THYROID SURGERY  1980  . VULVECTOMY N/A 06/22/2012   Procedure: WIDE EXCISION VULVECTOMY;  Surgeon: Allyn Kenner, DO;  Location: [facility];  Service: Gynecology;  Laterality: N/A;  wide local excision of vulva  . 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      Subjective Assessment - 10/18/16 1244    Subjective Pain in knee increased today, 5/10 maybe the weather? Had to go to the chiro the other day back pain was severe.  Therapy is helping, less stiffness overall but at night calf swells.    Patient Stated Goals Patient would like to be pain free, walking and the way I used to be (was walking 2 miles prior to Feb. )    Currently in Pain? Yes   Pain Score 5    Pain Location Knee   Pain Orientation Left;Medial;Lateral   Pain Descriptors / Indicators Aching   Pain Type Chronic pain   Pain Onset More than a month ago   Pain  Frequency Intermittent   Aggravating Factors  weightbearing    Pain Relieving Factors rest   Pain Score 3   Pain Location Back   Pain Orientation Left   Pain Descriptors / Indicators Aching   Pain Type Chronic pain   Pain Radiating Towards L hip post    Pain Onset More than a month ago   Pain Frequency Intermittent   Aggravating Factors  limping, bending    Pain Relieving Factors rest    Effect of Pain on Daily Activities back pain increases when my limp gets worse             [facility] PT Assessment - 10/18/16 0001      Observation/Other Assessments   Focus on Therapeutic Outcomes (FOTO)  74%     AROM   Lumbar Flexion WFL   Lumbar Extension WFL, feels good    Lumbar - Right Side Bend 25%   Lumbar - Left Side Bend 50%   Lumbar - Right Rotation 25%   Lumbar - Left Rotation 25%     Strength   Right Hip Extension 4+/5   Right Hip ABduction 4+/5   Left Hip Flexion 4+/5   Left Hip ABduction 4/5   Left Knee Flexion 4+/5   Left Knee Extension 4/5     Palpation  Palpation comment TTP L5 and L4 , L side            [facility] Adult PT Treatment/Exercise - 10/18/16 0001      Knee/Hip Exercises: Stretches   Active Hamstring Stretch Left;2 reps   Active Hamstring Stretch Limitations strap     Knee/Hip Exercises: Aerobic   Nustep L4 UE and LE for 6 min      Knee/Hip Exercises: Sidelying   Hip ABduction Strengthening;Both;1 set     Cryotherapy   Number Minutes Cryotherapy 15 Minutes   Cryotherapy Location Knee   Type of Cryotherapy Ice pack     Electrical Stimulation   Electrical Stimulation Location L knee   Electrical Stimulation Action IFC   Electrical Stimulation Parameters to tol (13)   Electrical Stimulation Goals Pain            PT Education - 10/18/16 1223    Education provided Yes   Education Details back pain, hold chiro if PT beginning, IFC, effect of knee/hip on back pain    Person(s) Educated Patient   Methods Explanation   Comprehension  Verbalized understanding;Returned demonstration          PT Short Term Goals - 10/18/16 1154      PT SHORT TERM GOAL #1   Title Pt will be I with initial HEP for knee flexibility and pain control.     Status Achieved     PT SHORT TERM GOAL #2   Title Pt will be able to walk with more ease, less visible lateral shift and more upright trunk.    Baseline varies ,  improvement trending.   Status On-going     PT SHORT TERM GOAL #3   Title Swelling in LLE to resolve in calf to improve tightness, knee flexion at rest in a chair.    Baseline Rt. 14. 5 , Lt. 15.5 inch    Status Partially Met           PT Long Term Goals - 10/18/16 1156      PT LONG TERM GOAL #1   Title FOTO score will improve to 50% or less impaired to demo functional improvement with walking, mobilty.    Baseline 74%   Status On-going     PT LONG TERM GOAL #2   Title Pt will be able to work with pain in Knee, back <4/10 most of the time.    Status Partially Met     PT LONG TERM GOAL #3   Title Pt will be able to report less (50%) stiffness in L knee upon standing after driving, sitting 00-76 min    Baseline improved 25%-50%   Status Partially Met     PT LONG TERM GOAL #4   Title Pt will be I with more advanced HEP for knee, hip, core    Status On-going     PT LONG TERM GOAL #5   Title Pt will be able to squat, lift with good body mechanics and carry over for prevention of reinjury.    Status Unable to assess     PT LONG TERM GOAL #6   Title Pt will be able to reduce limp with ambulation in the community to maintain and prevent increased back pain    Time 8   Period Weeks   Status New   Target Date 12/13/16               Plan - 10/18/16 1233    Clinical Impression Statement  Patient seen for Re-evaluation of knee, hip and back.  Treatments have been focused more on knee pain s/P surgery. She had been doing much better, having less pain with gait, ADLs, work.  This past weekend she had  increased pain, may have been due to weather change, did SLS exercises last session?  She has been seeing chiropractor for years.  She now has increased back and hip pain due to gait abnormality She wanted [place] to treat her lumbar pain, and will do so, but mostly as it relates to her overall picture of mobility.  She has had no recent imaging of her spine but recalls disc degeneration.  She should continue to improve despite this recent set back.     Rehab Potential Excellent   PT Frequency 2x / week   PT Duration 6 weeks   PT Treatment/Interventions ADLs/Self Care Home Management;Patient/family education;Taping;Gait training;Stair training;Cryotherapy;Electrical Stimulation;Iontophoresis 85m/ml Dexamethasone;Moist Heat;Functional mobility training;Therapeutic activities;Therapeutic exercise;Balance training;Neuromuscular re-education;Passive range of motion;Manual techniques;Ultrasound;DME Instruction;Vasopneumatic Device;Dry needling;Manual lymph drainage   PT Next Visit Plan pt to bring in HEP from the past, check it and add in trunk flexibility (lateral sidelying stretch L side, decompression via sink stretch), cont hip, core, knee , repeat IFC?    PT Home Exercise Plan ITB, hamstring and calf stretching ,  quad set, SLR LAQ and bridging , piriformis ,prone ext    Consulted and Agree with Plan of Care Patient      Patient will benefit from skilled therapeutic intervention in order to improve the following deficits and impairments:  Abnormal gait, Decreased activity tolerance, Decreased balance, Impaired flexibility, Postural dysfunction, Improper body mechanics, Decreased range of motion, Decreased mobility, Difficulty walking, Pain, Increased fascial restricitons, Decreased strength, Increased edema, Hypomobility  Visit Diagnosis: Stiffness of left knee, not elsewhere classified  Abnormal posture  Difficulty in walking, not elsewhere classified  Muscle weakness (generalized)  Localized  edema  Chronic left-sided low back pain without sciatica       G-Codes - 110/23/20181252    Functional Assessment Tool Used (Outpatient Only) FOTO   Functional Limitation Mobility: Walking and moving around   Mobility: Walking and Moving Around Current Status [phone] At least 60 percent but less than 80 percent impaired, limited or restricted   Mobility: Walking and Moving Around Goal Status [phone] At least 60 percent but less than 80 percent impaired, limited or restricted      Problem List Patient Active Problem List   Diagnosis Date Noted  . Anxiety state, unspecified 08/15/2013  . Unspecified hereditary and idiopathic peripheral neuropathy 08/15/2013  . Unspecified essential hypertension 08/15/2013  . Other and unspecified hyperlipidemia 08/15/2013  . Hyperlipidemia 08/15/2013    PAA,JENNIFER 12018-10-23 1:04 PM  [facility] [address]Phone: [phone]  Fax:  [phone] Name: Sharon IBEMRN: [id number] of Birth: 1April 04, 1947 [place] [address]-10-231:04 PM Phone: [phone]: [phone]

## 2016-10-21 ENCOUNTER — Encounter: Payer: Medicare Other | Admitting: Physical Therapy

## 2016-10-24 ENCOUNTER — Ambulatory Visit: Payer: Medicare Other | Admitting: Physical Therapy

## 2016-10-24 DIAGNOSIS — M545 Low back pain, unspecified: Secondary | ICD-10-CM

## 2016-10-24 DIAGNOSIS — M25662 Stiffness of left knee, not elsewhere classified: Secondary | ICD-10-CM

## 2016-10-24 DIAGNOSIS — R600 Localized edema: Secondary | ICD-10-CM | POA: Diagnosis not present

## 2016-10-24 DIAGNOSIS — R262 Difficulty in walking, not elsewhere classified: Secondary | ICD-10-CM

## 2016-10-24 DIAGNOSIS — R293 Abnormal posture: Secondary | ICD-10-CM | POA: Diagnosis not present

## 2016-10-24 DIAGNOSIS — G8929 Other chronic pain: Secondary | ICD-10-CM

## 2016-10-24 DIAGNOSIS — M6281 Muscle weakness (generalized): Secondary | ICD-10-CM | POA: Diagnosis not present

## 2016-10-25 NOTE — Therapy (Signed)
[place], [place], [address] Phone: [phone]   Fax:  [phone]  Physical Therapy Treatment  Patient Details  Name: Sharon Marsh MRN: [id number] Date of Birth: [birth year] Referring Provider: Dr. Ramonita Lab  Encounter Date: 10/24/2016      PT End of Session - 10/24/16 1321    Visit Number 14   Number of Visits 25   Date for PT Re-Evaluation 11/29/16   PT Start Time 0100   PT Stop Time 0210   PT Time Calculation (min) 70 min      Past Medical History:  Diagnosis Date  . GERD (gastroesophageal reflux disease)   . Hiatal hernia   . Hypertension   . Other and unspecified hyperlipidemia   . Personal history of vulvar dysplasia   . PONV (postoperative nausea and vomiting)     Past Surgical History:  Procedure Laterality Date  . ABDOMINAL HYSTERECTOMY    . APPENDECTOMY  1950  . THYROID SURGERY  1980  . VULVECTOMY N/A 06/22/2012   Procedure: WIDE EXCISION VULVECTOMY;  Surgeon: Allyn Kenner, DO;  Location: [facility];  Service: Gynecology;  Laterality: N/A;  wide local excision of vulva  . 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      Subjective Assessment - 10/24/16 1311    Subjective I was able to work in the yard for 1 hour yesterday.    Currently in Pain? Yes   Pain Score 3    Pain Location Knee   Pain Orientation Left;Medial   Pain Descriptors / Indicators Aching;Sharp   Pain Score 0   Pain Location Back                         [facility] Adult PT Treatment/Exercise - 10/25/16 0001      Lumbar Exercises: Stretches   Pelvic Tilt 10 seconds     Lumbar Exercises: Supine   AB Set Limitations pelvic tilt verses abdominal brace    Clam 10 reps   Heel Slides 10 reps   Bent Knee Raise 10 reps     Knee/Hip Exercises: Stretches   Gastroc Stretch Limitations slant board stretch x 1 minute       Knee/Hip Exercises: Aerobic   Nustep L4 UE and LE for 6 min      Knee/Hip Exercises: Standing   Gait Training heel strike and equal step lengt cues    Other Standing Knee Exercises step and reach arm up wall for glut activation x 10      Knee/Hip Exercises: Seated   Long Arc Quad Strengthening;Left;1 set;20 reps   Long Arc Quad Weight 3 lbs.   Sit to Sand 1 set;10 reps;without UE support     Knee/Hip Exercises: Supine   Terminal Knee Extension Limitations with heel prop      Moist Heat Therapy   Number Minutes Moist Heat 15 Minutes   Moist Heat Location Knee     Electrical Stimulation   Electrical Stimulation Location L knee   Electrical Stimulation Action IFC   Electrical Stimulation Parameters to tolerance    Electrical Stimulation Goals Pain     Manual Therapy   Manual therapy comments patella mobs with HMP all planes                 PT Education - 10/24/16 1358    Education  provided Yes   Education Details HEP   Person(s) Educated Patient   Methods Explanation;Handout   Comprehension Verbalized understanding          PT Short Term Goals - 10/18/16 1154      PT SHORT TERM GOAL #1   Title Pt will be I with initial HEP for knee flexibility and pain control.     Status Achieved     PT SHORT TERM GOAL #2   Title Pt will be able to walk with more ease, less visible lateral shift and more upright trunk.    Baseline varies ,  improvement trending.   Status On-going     PT SHORT TERM GOAL #3   Title Swelling in LLE to resolve in calf to improve tightness, knee flexion at rest in a chair.    Baseline Rt. 14. 5 , Lt. 15.5 inch    Status Partially Met           PT Long Term Goals - 10/18/16 1156      PT LONG TERM GOAL #1   Title FOTO score will improve to 50% or less impaired to demo functional improvement with walking, mobilty.    Baseline 74%   Status On-going     PT LONG TERM GOAL #2   Title Pt will be able to work with pain in Knee, back <4/10  most of the time.    Status Partially Met     PT LONG TERM GOAL #3   Title Pt will be able to report less (50%) stiffness in L knee upon standing after driving, sitting 16-10 min    Baseline improved 25%-50%   Status Partially Met     PT LONG TERM GOAL #4   Title Pt will be I with more advanced HEP for knee, hip, core    Status On-going     PT LONG TERM GOAL #5   Title Pt will be able to squat, lift with good body mechanics and carry over for prevention of reinjury.    Status Unable to assess     PT LONG TERM GOAL #6   Title Pt will be able to reduce limp with ambulation in the community to maintain and prevent increased back pain    Time 8   Period Weeks   Status New   Target Date 12/13/16               Plan - 10/24/16 1402    Clinical Impression Statement Pt reports knee is feeling better. Continues with limp associated with hip weakness and lack of full extension. Worked on extension and patella mobility. Began core and issued initial HEP. Repeated IFC for knee pain.    PT Next Visit Plan pt to bring in HEP from the past, check it and add in trunk flexibility (lateral sidelying stretch L side, decompression via sink stretch), cont hip, core, knee , repeat IFC?    PT Home Exercise Plan ITB, hamstring and calf stretching ,  quad set, SLR LAQ and bridging , piriformis ,prone ext    Consulted and Agree with Plan of Care Patient      Patient will benefit from skilled therapeutic intervention in order to improve the following deficits and impairments:  Abnormal gait, Decreased activity tolerance, Decreased balance, Impaired flexibility, Postural dysfunction, Improper body mechanics, Decreased range of motion, Decreased mobility, Difficulty walking, Pain, Increased fascial restricitons, Decreased strength, Increased edema, Hypomobility  Visit Diagnosis: Abnormal posture  Stiffness of left knee, not  elsewhere classified  Difficulty in walking, not elsewhere  classified  Muscle weakness (generalized)  Localized edema  Chronic left-sided low back pain without sciatica     Problem List Patient Active Problem List   Diagnosis Date Noted  . Anxiety state, unspecified 08/15/2013  . Unspecified hereditary and idiopathic peripheral neuropathy 08/15/2013  . Unspecified essential hypertension 08/15/2013  . Other and unspecified hyperlipidemia 08/15/2013  . Hyperlipidemia 08/15/2013    Dorene Ar , PTA 10/25/2016, 8:19 AM  [place], [place], [address] Phone: [phone]   Fax:  [phone]  Name: Sharon Marsh MRN: [id number] Date of Birth: [birth year]

## 2016-10-28 ENCOUNTER — Ambulatory Visit: Payer: Medicare Other | Admitting: Physical Therapy

## 2016-10-28 DIAGNOSIS — R293 Abnormal posture: Secondary | ICD-10-CM | POA: Diagnosis not present

## 2016-10-28 DIAGNOSIS — R600 Localized edema: Secondary | ICD-10-CM

## 2016-10-28 DIAGNOSIS — M545 Low back pain, unspecified: Secondary | ICD-10-CM

## 2016-10-28 DIAGNOSIS — M6281 Muscle weakness (generalized): Secondary | ICD-10-CM

## 2016-10-28 DIAGNOSIS — M25662 Stiffness of left knee, not elsewhere classified: Secondary | ICD-10-CM

## 2016-10-28 DIAGNOSIS — G8929 Other chronic pain: Secondary | ICD-10-CM

## 2016-10-28 DIAGNOSIS — R262 Difficulty in walking, not elsewhere classified: Secondary | ICD-10-CM | POA: Diagnosis not present

## 2016-10-28 NOTE — Therapy (Signed)
[place], [place], [address] Phone: [phone]   Fax:  [phone]  Physical Therapy Treatment  Patient Details  Name: Sharon Marsh MRN: [id number] Date of Birth: 1945-08-27 Referring Provider: Dr. Ramonita Lab  Encounter Date: 10/28/2016      PT End of Session - 10/28/16 1152    Visit Number 15   Number of Visits 25   Date for PT Re-Evaluation 11/29/16   PT Start Time 1147   PT Stop Time 1233   PT Time Calculation (min) 46 min      Past Medical History:  Diagnosis Date  . GERD (gastroesophageal reflux disease)   . Hiatal hernia   . Hypertension   . Other and unspecified hyperlipidemia   . Personal history of vulvar dysplasia   . PONV (postoperative nausea and vomiting)     Past Surgical History:  Procedure Laterality Date  . ABDOMINAL HYSTERECTOMY    . APPENDECTOMY  1950  . THYROID SURGERY  1980  . VULVECTOMY N/A 06/22/2012   Procedure: WIDE EXCISION VULVECTOMY;  Surgeon: Allyn Kenner, DO;  Location: [facility];  Service: Gynecology;  Laterality: N/A;  wide local excision of vulva  . 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      Subjective Assessment - 10/28/16 1151    Subjective I feel better. I think I am walking better.    Currently in Pain? No/denies            [facility] PT Assessment - 10/28/16 0001      Observation/Other Assessments   Focus on Therapeutic Outcomes (FOTO)  39%                      [facility] Adult PT Treatment/Exercise - 10/28/16 0001      Lumbar Exercises: Stretches   Quadruped Mid Back Stretch Limitations seated physioball roll outs      Lumbar Exercises: Supine   AB Set Limitations pelvic tilt verses abdominal brace    Clam 10 reps   Clam Limitations green band bilateral and unilateral    Heel Slides 10 reps   Bent Knee Raise 10 reps   Bridge 10 reps   Bridge  Limitations green band      Knee/Hip Exercises: Aerobic   Nustep L4 UE and LE for 6 min      Knee/Hip Exercises: Standing   Other Standing Knee Exercises step and reach arm up wall for glut activation x 10      Knee/Hip Exercises: Seated   Sit to Sand 1 set;10 reps;without UE support                  PT Short Term Goals - 10/28/16 1219      PT SHORT TERM GOAL #1   Title Pt will be I with initial HEP for knee flexibility and pain control.     Status Achieved     PT SHORT TERM GOAL #2   Title Pt will be able to walk with more ease, less visible lateral shift and more upright trunk.    Baseline varies, much better today   Period Weeks   Status Partially Met     PT SHORT TERM GOAL #3   Title Swelling in LLE to resolve in calf to improve tightness, knee flexion at rest in a chair.    Baseline Rt. 14. 5 ,  Lt. 15.5 inch    Time 4   Period Weeks   Status Partially Met           PT Long Term Goals - 10/28/16 1220      PT LONG TERM GOAL #1   Title FOTO score will improve to 50% or less impaired to demo functional improvement with walking, mobilty.    Baseline 39%   Time 8   Period Weeks   Status Achieved     PT LONG TERM GOAL #2   Title Pt will be able to work with pain in Knee, back <4/10 most of the time.    Time 8   Period Weeks   Status Achieved     PT LONG TERM GOAL #3   Title Pt will be able to report less (50%) stiffness in L knee upon standing after driving, sitting 94-70 min    Baseline 50% or more    Time 8   Period Weeks   Status Achieved     PT LONG TERM GOAL #4   Title Pt will be I with more advanced HEP for knee, hip, core    Time 8   Period Weeks   Status Achieved     PT LONG TERM GOAL #5   Title Pt will be able to squat, lift with good body mechanics and carry over for prevention of reinjury.    Time 8   Period Weeks   Status Achieved     PT LONG TERM GOAL #6   Title Pt will be able to reduce limp with ambulation in the community  to maintain and prevent increased back pain    Baseline slight trendelenberg   Time 8   Period Weeks   Status Partially Met               Plan - 10/28/16 1223    Clinical Impression Statement Pt reports feeling better today. She is progressing well toward goals. Reviewed and updated core HEP and added side QL stretch and seated QL stretch with physioball rollouts. She will likely be ready for DC if still doing well over the next 2-3 visits.    PT Next Visit Plan review side QL stretch, try decompression via sink stretch, review core HEP, lumbar flexibility, check lifting and body mechanics NEXT    PT Home Exercise Plan ITB, hamstring and calf stretching ,  quad set, SLR LAQ and bridging , piriformis ,prone ext, supine prepilates clam, knee raise, heel slide, SLR , side QL stretch    Consulted and Agree with Plan of Care Patient      Patient will benefit from skilled therapeutic intervention in order to improve the following deficits and impairments:  Abnormal gait, Decreased activity tolerance, Decreased balance, Impaired flexibility, Postural dysfunction, Improper body mechanics, Decreased range of motion, Decreased mobility, Difficulty walking, Pain, Increased fascial restricitons, Decreased strength, Increased edema, Hypomobility  Visit Diagnosis: Abnormal posture  Stiffness of left knee, not elsewhere classified  Difficulty in walking, not elsewhere classified  Muscle weakness (generalized)  Localized edema  Chronic left-sided low back pain without sciatica     Problem List Patient Active Problem List   Diagnosis Date Noted  . Anxiety state, unspecified 08/15/2013  . Unspecified hereditary and idiopathic peripheral neuropathy 08/15/2013  . Unspecified essential hypertension 08/15/2013  . Other and unspecified hyperlipidemia 08/15/2013  . Hyperlipidemia 08/15/2013    Dorene Ar , PTA 10/28/2016, 12:41 PM  [place]  [facility] [phone]  [place], [place], [address] Phone: [phone]   Fax:  [phone]  Name: ARNETRA TERRIS MRN: [id number] Date of Birth: [birth year]

## 2016-10-31 ENCOUNTER — Encounter: Payer: Medicare Other | Admitting: Physical Therapy

## 2016-11-01 ENCOUNTER — Ambulatory Visit: Payer: Medicare Other | Admitting: Physical Therapy

## 2016-11-07 ENCOUNTER — Ambulatory Visit: Payer: Medicare Other | Attending: Orthopedic Surgery | Admitting: Physical Therapy

## 2016-11-07 ENCOUNTER — Encounter: Payer: Self-pay | Admitting: Physical Therapy

## 2016-11-07 DIAGNOSIS — M545 Low back pain, unspecified: Secondary | ICD-10-CM

## 2016-11-07 DIAGNOSIS — M6281 Muscle weakness (generalized): Secondary | ICD-10-CM | POA: Insufficient documentation

## 2016-11-07 DIAGNOSIS — G8929 Other chronic pain: Secondary | ICD-10-CM | POA: Insufficient documentation

## 2016-11-07 DIAGNOSIS — R262 Difficulty in walking, not elsewhere classified: Secondary | ICD-10-CM | POA: Insufficient documentation

## 2016-11-07 DIAGNOSIS — M25662 Stiffness of left knee, not elsewhere classified: Secondary | ICD-10-CM | POA: Insufficient documentation

## 2016-11-07 DIAGNOSIS — R600 Localized edema: Secondary | ICD-10-CM | POA: Insufficient documentation

## 2016-11-07 DIAGNOSIS — R293 Abnormal posture: Secondary | ICD-10-CM | POA: Diagnosis not present

## 2016-11-07 NOTE — Therapy (Addendum)
[place], [place], [address] Phone: [phone]   Fax:  [phone]  Physical Therapy Treatment and Discharge  Patient Details  Name: Sharon Marsh MRN: [id number] Date of Birth: 19-Feb-1945 Referring Provider: Dr. Ramonita Lab   Encounter Date: 11/07/2016  PT End of Session - 11/07/16 1256    Visit Number  16    Number of Visits  25    Date for PT Re-Evaluation  11/29/16    PT Start Time  9407    PT Stop Time  6808    PT Time Calculation (min)  49 min       Past Medical History:  Diagnosis Date  . GERD (gastroesophageal reflux disease)   . Hiatal hernia   . Hypertension   . Other and unspecified hyperlipidemia   . Personal history of vulvar dysplasia   . PONV (postoperative nausea and vomiting)     Past Surgical History:  Procedure Laterality Date  . ABDOMINAL HYSTERECTOMY    . APPENDECTOMY  1950  . THYROID SURGERY  1980  . 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  Subjective Assessment - 11/07/16 1258    Subjective  just hurts when I walk at a 1/10.     Currently in Pain?  Yes    Pain Score  1     Pain Location  Knee    Pain Orientation  Left;Medial    Pain Descriptors / Indicators  Aching    Aggravating Factors   weightbearing too long     Pain Relieving Factors  rest     Pain Score  0    Pain Location  Back         [facility] PT Assessment - 11/07/16 0001      Observation/Other Assessments   Focus on Therapeutic Outcomes (FOTO)   39%       AROM   Right Knee Flexion  135    Left Knee Extension  -10    Left Knee Flexion  132      Strength   Right Hip Extension  4+/5    Right Hip ABduction  4+/5    Left Hip Flexion  4+/5    Left Hip ABduction  4/5    Left Knee Flexion  4+/5    Left Knee Extension  4+/5                  [facility] Adult PT Treatment/Exercise - 11/07/16 0001      Lumbar  Exercises: Supine   AB Set Limitations  pelvic tilt verses abdominal brace     Clam  10 reps    Clam Limitations  green band bilateral and unilateral     Heel Slides  10 reps    Bent Knee Raise  10 reps    Bridge  10 reps    Bridge Limitations  green band       Knee/Hip Exercises: Therapist, occupational reps    Active Hamstring Stretch Limitations  foot in chair     Gastroc Stretch Limitations  slant board stretch x 1 minute       Knee/Hip Exercises: Aerobic   Nustep  L5 UE and LE for 6 min       Knee/Hip Exercises: Standing   Gait Training  heel strike and equal step lengt cues  Other Standing Knee Exercises  TKE at wall with ball     Other Standing Knee Exercises  step and reach arm up wall for glut activation x 10       Knee/Hip Exercises: Seated   Other Seated Knee/Hip Exercises  quad set with heel on floor     Sit to Sand  1 set;10 reps;without UE support      Knee/Hip Exercises: Supine   Terminal Knee Extension Limitations  with heel prop     Straight Leg Raises  10 reps    Straight Leg Raises Limitations  with iniital quad set       Knee/Hip Exercises: Sidelying   Hip ABduction  Strengthening;Both;1 set               PT Short Term Goals - 11/07/16 1337      PT SHORT TERM GOAL #1   Title  Pt will be I with initial HEP for knee flexibility and pain control.      Time  4    Period  Weeks    Status  Achieved      PT SHORT TERM GOAL #2   Title  Pt will be able to walk with more ease, less visible lateral shift and more upright trunk.     Time  4    Period  Weeks    Status  Achieved      PT SHORT TERM GOAL #3   Title  Swelling in LLE to resolve in calf to improve tightness, knee flexion at rest in a chair.     Baseline  within 1/4 inch circumference differenc e    Time  4    Period  Weeks    Status  Partially Met        PT Long Term Goals - 11/07/16 1356      PT LONG TERM GOAL #1   Title  FOTO score will improve to 50%  or less impaired to demo functional improvement with walking, mobilty.     Baseline  39%    Time  8    Period  Weeks    Status  Achieved      PT LONG TERM GOAL #2   Title  Pt will be able to work with pain in Knee, back <4/10 most of the time.     Time  8    Period  Weeks    Status  Achieved      PT LONG TERM GOAL #3   Title  Pt will be able to report less (50%) stiffness in L knee upon standing after driving, sitting 89-37 min     Baseline  50% or more     Time  8    Period  Weeks    Status  Achieved      PT LONG TERM GOAL #4   Title  Pt will be I with more advanced HEP for knee, hip, core     Time  8    Period  Weeks    Status  Achieved      PT LONG TERM GOAL #5   Title  Pt will be able to squat, lift with good body mechanics and carry over for prevention of reinjury.     Time  8    Period  Weeks    Status  Achieved      PT LONG TERM GOAL #6   Title  Pt will be able to reduce limp with ambulation  in the community to maintain and prevent increased back pain     Baseline  no limp most of the time     Time  8    Period  Weeks    Status  Achieved            Plan - 11/07/16 1301    Clinical Impression Statement  Was able to walk at blowing [place] up and down inclines for 6 hours with occassional rests at benches. She was very sore the next day in her back and knee. She reports overall  >/=75 %  improvement in knee pain / function since beginning PT. Her FOTO score improved. She can get up and wlak from seated without difficulty now. She still lacks knee extension compared to opposite knee and plans to continue working on this. She is independent with HEP for knee and Core. Agreeable to discharge today.     PT Next Visit Plan  review side QL stretch, try decompression via sink stretch, review core HEP, lumbar flexibility, check lifting and body mechanics NEXT     PT Home Exercise Plan  ITB, hamstring and calf stretching ,  quad set, SLR LAQ and bridging , piriformis ,prone  ext, supine prepilates clam, knee raise, heel slide, SLR , side QL stretch     Consulted and Agree with Plan of Care  Patient       Patient will benefit from skilled therapeutic intervention in order to improve the following deficits and impairments:  Abnormal gait, Decreased activity tolerance, Decreased balance, Impaired flexibility, Postural dysfunction, Improper body mechanics, Decreased range of motion, Decreased mobility, Difficulty walking, Pain, Increased fascial restricitons, Decreased strength, Increased edema, Hypomobility  Visit Diagnosis: Abnormal posture  Stiffness of left knee, not elsewhere classified  Difficulty in walking, not elsewhere classified  Muscle weakness (generalized)  Localized edema  Chronic left-sided low back pain without sciatica     Problem List Patient Active Problem List   Diagnosis Date Noted  . Anxiety state, unspecified 08/15/2013  . Unspecified hereditary and idiopathic peripheral neuropathy 08/15/2013  . Unspecified essential hypertension 08/15/2013  . Other and unspecified hyperlipidemia 08/15/2013  . Hyperlipidemia 08/15/2013    Dorene Ar, PTA 11/07/2016, 1:58 PM  [facility] [address] [place], [place], [address] Phone: [phone]   Fax:  [phone]  Name: Sharon Marsh MRN: [id number] Date of Birth: 1945/11/24  PHYSICAL THERAPY DISCHARGE SUMMARY  Visits from Start of Care: 16  Current functional level related to goals / functional outcomes: See above   Remaining deficits: Knee ROM, gait    Education / Equipment: HEP, gait pattern, RICE, stretching Plan: Patient agrees to discharge.  Patient goals were met. Patient is being discharged due to meeting the stated rehab goals.  ?????    Raeford Razor, PT 11/17/16 12:12 PM Phone: [phone] Fax: [phone]

## 2016-11-09 ENCOUNTER — Encounter: Payer: Medicare Other | Admitting: Physical Therapy

## 2016-11-15 ENCOUNTER — Encounter: Payer: Medicare Other | Admitting: Physical Therapy

## 2016-11-17 ENCOUNTER — Encounter: Payer: Medicare Other | Admitting: Physical Therapy

## 2016-11-21 ENCOUNTER — Encounter: Payer: Medicare Other | Admitting: Physical Therapy

## 2016-11-22 ENCOUNTER — Encounter: Payer: Medicare Other | Admitting: Physical Therapy

## 2016-11-29 DIAGNOSIS — Z779 Other contact with and (suspected) exposures hazardous to health: Secondary | ICD-10-CM | POA: Diagnosis not present

## 2016-11-29 DIAGNOSIS — Z6831 Body mass index (BMI) 31.0-31.9, adult: Secondary | ICD-10-CM | POA: Diagnosis not present

## 2016-11-29 DIAGNOSIS — Z1231 Encounter for screening mammogram for malignant neoplasm of breast: Secondary | ICD-10-CM | POA: Diagnosis not present

## 2016-11-29 DIAGNOSIS — Z124 Encounter for screening for malignant neoplasm of cervix: Secondary | ICD-10-CM | POA: Diagnosis not present

## 2016-12-20 DIAGNOSIS — M5136 Other intervertebral disc degeneration, lumbar region: Secondary | ICD-10-CM | POA: Diagnosis not present

## 2016-12-20 DIAGNOSIS — M9903 Segmental and somatic dysfunction of lumbar region: Secondary | ICD-10-CM | POA: Diagnosis not present

## 2016-12-21 DIAGNOSIS — M9903 Segmental and somatic dysfunction of lumbar region: Secondary | ICD-10-CM | POA: Diagnosis not present

## 2016-12-21 DIAGNOSIS — M5136 Other intervertebral disc degeneration, lumbar region: Secondary | ICD-10-CM | POA: Diagnosis not present

## 2016-12-21 DIAGNOSIS — M238X2 Other internal derangements of left knee: Secondary | ICD-10-CM | POA: Diagnosis not present

## 2016-12-29 DIAGNOSIS — M9903 Segmental and somatic dysfunction of lumbar region: Secondary | ICD-10-CM | POA: Diagnosis not present

## 2016-12-29 DIAGNOSIS — M5136 Other intervertebral disc degeneration, lumbar region: Secondary | ICD-10-CM | POA: Diagnosis not present

## 2017-01-02 DIAGNOSIS — M5136 Other intervertebral disc degeneration, lumbar region: Secondary | ICD-10-CM | POA: Diagnosis not present

## 2017-01-02 DIAGNOSIS — M9903 Segmental and somatic dysfunction of lumbar region: Secondary | ICD-10-CM | POA: Diagnosis not present

## 2017-01-05 DIAGNOSIS — M9903 Segmental and somatic dysfunction of lumbar region: Secondary | ICD-10-CM | POA: Diagnosis not present

## 2017-01-05 DIAGNOSIS — M5136 Other intervertebral disc degeneration, lumbar region: Secondary | ICD-10-CM | POA: Diagnosis not present

## 2017-01-09 DIAGNOSIS — M5136 Other intervertebral disc degeneration, lumbar region: Secondary | ICD-10-CM | POA: Diagnosis not present

## 2017-01-09 DIAGNOSIS — M9903 Segmental and somatic dysfunction of lumbar region: Secondary | ICD-10-CM | POA: Diagnosis not present

## 2017-01-12 DIAGNOSIS — M9903 Segmental and somatic dysfunction of lumbar region: Secondary | ICD-10-CM | POA: Diagnosis not present

## 2017-01-12 DIAGNOSIS — M5136 Other intervertebral disc degeneration, lumbar region: Secondary | ICD-10-CM | POA: Diagnosis not present

## 2017-01-17 DIAGNOSIS — M5136 Other intervertebral disc degeneration, lumbar region: Secondary | ICD-10-CM | POA: Diagnosis not present

## 2017-01-17 DIAGNOSIS — M9903 Segmental and somatic dysfunction of lumbar region: Secondary | ICD-10-CM | POA: Diagnosis not present

## 2017-01-25 DIAGNOSIS — M5136 Other intervertebral disc degeneration, lumbar region: Secondary | ICD-10-CM | POA: Diagnosis not present

## 2017-01-25 DIAGNOSIS — M9903 Segmental and somatic dysfunction of lumbar region: Secondary | ICD-10-CM | POA: Diagnosis not present

## 2017-01-30 DIAGNOSIS — M9903 Segmental and somatic dysfunction of lumbar region: Secondary | ICD-10-CM | POA: Diagnosis not present

## 2017-01-30 DIAGNOSIS — M5136 Other intervertebral disc degeneration, lumbar region: Secondary | ICD-10-CM | POA: Diagnosis not present

## 2017-02-07 DIAGNOSIS — M9903 Segmental and somatic dysfunction of lumbar region: Secondary | ICD-10-CM | POA: Diagnosis not present

## 2017-02-07 DIAGNOSIS — M5136 Other intervertebral disc degeneration, lumbar region: Secondary | ICD-10-CM | POA: Diagnosis not present

## 2017-02-20 DIAGNOSIS — M9903 Segmental and somatic dysfunction of lumbar region: Secondary | ICD-10-CM | POA: Diagnosis not present

## 2017-02-20 DIAGNOSIS — M5136 Other intervertebral disc degeneration, lumbar region: Secondary | ICD-10-CM | POA: Diagnosis not present

## 2017-02-21 DIAGNOSIS — L03039 Cellulitis of unspecified toe: Secondary | ICD-10-CM | POA: Diagnosis not present

## 2017-02-21 DIAGNOSIS — Z6830 Body mass index (BMI) 30.0-30.9, adult: Secondary | ICD-10-CM | POA: Diagnosis not present

## 2017-03-02 DIAGNOSIS — M5136 Other intervertebral disc degeneration, lumbar region: Secondary | ICD-10-CM | POA: Diagnosis not present

## 2017-03-02 DIAGNOSIS — M9903 Segmental and somatic dysfunction of lumbar region: Secondary | ICD-10-CM | POA: Diagnosis not present

## 2017-03-08 DIAGNOSIS — M9903 Segmental and somatic dysfunction of lumbar region: Secondary | ICD-10-CM | POA: Diagnosis not present

## 2017-03-08 DIAGNOSIS — M5136 Other intervertebral disc degeneration, lumbar region: Secondary | ICD-10-CM | POA: Diagnosis not present

## 2017-03-15 DIAGNOSIS — M9903 Segmental and somatic dysfunction of lumbar region: Secondary | ICD-10-CM | POA: Diagnosis not present

## 2017-03-15 DIAGNOSIS — M5136 Other intervertebral disc degeneration, lumbar region: Secondary | ICD-10-CM | POA: Diagnosis not present

## 2017-03-30 DIAGNOSIS — L03039 Cellulitis of unspecified toe: Secondary | ICD-10-CM | POA: Diagnosis not present

## 2017-03-30 DIAGNOSIS — E7849 Other hyperlipidemia: Secondary | ICD-10-CM | POA: Diagnosis not present

## 2017-03-30 DIAGNOSIS — G4700 Insomnia, unspecified: Secondary | ICD-10-CM | POA: Diagnosis not present

## 2017-03-30 DIAGNOSIS — Z9181 History of falling: Secondary | ICD-10-CM | POA: Diagnosis not present

## 2017-03-30 DIAGNOSIS — Z6830 Body mass index (BMI) 30.0-30.9, adult: Secondary | ICD-10-CM | POA: Diagnosis not present

## 2017-03-30 DIAGNOSIS — M9903 Segmental and somatic dysfunction of lumbar region: Secondary | ICD-10-CM | POA: Diagnosis not present

## 2017-03-30 DIAGNOSIS — M5136 Other intervertebral disc degeneration, lumbar region: Secondary | ICD-10-CM | POA: Diagnosis not present

## 2017-03-30 DIAGNOSIS — F411 Generalized anxiety disorder: Secondary | ICD-10-CM | POA: Diagnosis not present

## 2017-03-30 DIAGNOSIS — Z1331 Encounter for screening for depression: Secondary | ICD-10-CM | POA: Diagnosis not present

## 2017-03-30 DIAGNOSIS — Z1211 Encounter for screening for malignant neoplasm of colon: Secondary | ICD-10-CM | POA: Diagnosis not present

## 2017-03-30 DIAGNOSIS — I10 Essential (primary) hypertension: Secondary | ICD-10-CM | POA: Diagnosis not present

## 2017-03-30 DIAGNOSIS — Z2821 Immunization not carried out because of patient refusal: Secondary | ICD-10-CM | POA: Diagnosis not present

## 2017-03-30 DIAGNOSIS — M2550 Pain in unspecified joint: Secondary | ICD-10-CM | POA: Diagnosis not present

## 2017-03-30 DIAGNOSIS — Z1389 Encounter for screening for other disorder: Secondary | ICD-10-CM | POA: Diagnosis not present

## 2017-04-25 DIAGNOSIS — Z1212 Encounter for screening for malignant neoplasm of rectum: Secondary | ICD-10-CM | POA: Diagnosis not present

## 2017-04-25 DIAGNOSIS — Z1211 Encounter for screening for malignant neoplasm of colon: Secondary | ICD-10-CM | POA: Diagnosis not present

## 2017-04-27 DIAGNOSIS — M5136 Other intervertebral disc degeneration, lumbar region: Secondary | ICD-10-CM | POA: Diagnosis not present

## 2017-04-27 DIAGNOSIS — M9903 Segmental and somatic dysfunction of lumbar region: Secondary | ICD-10-CM | POA: Diagnosis not present

## 2017-05-04 DIAGNOSIS — M5136 Other intervertebral disc degeneration, lumbar region: Secondary | ICD-10-CM | POA: Diagnosis not present

## 2017-05-04 DIAGNOSIS — M9903 Segmental and somatic dysfunction of lumbar region: Secondary | ICD-10-CM | POA: Diagnosis not present

## 2017-05-11 DIAGNOSIS — M5136 Other intervertebral disc degeneration, lumbar region: Secondary | ICD-10-CM | POA: Diagnosis not present

## 2017-05-11 DIAGNOSIS — M9903 Segmental and somatic dysfunction of lumbar region: Secondary | ICD-10-CM | POA: Diagnosis not present

## 2017-05-25 DIAGNOSIS — R05 Cough: Secondary | ICD-10-CM | POA: Diagnosis not present

## 2017-05-25 DIAGNOSIS — Z6830 Body mass index (BMI) 30.0-30.9, adult: Secondary | ICD-10-CM | POA: Diagnosis not present

## 2017-05-25 DIAGNOSIS — J019 Acute sinusitis, unspecified: Secondary | ICD-10-CM | POA: Diagnosis not present

## 2017-06-05 DIAGNOSIS — M9903 Segmental and somatic dysfunction of lumbar region: Secondary | ICD-10-CM | POA: Diagnosis not present

## 2017-06-05 DIAGNOSIS — M5136 Other intervertebral disc degeneration, lumbar region: Secondary | ICD-10-CM | POA: Diagnosis not present

## 2017-06-19 DIAGNOSIS — M9903 Segmental and somatic dysfunction of lumbar region: Secondary | ICD-10-CM | POA: Diagnosis not present

## 2017-06-19 DIAGNOSIS — M5136 Other intervertebral disc degeneration, lumbar region: Secondary | ICD-10-CM | POA: Diagnosis not present

## 2017-06-29 DIAGNOSIS — N3000 Acute cystitis without hematuria: Secondary | ICD-10-CM | POA: Diagnosis not present

## 2017-06-29 DIAGNOSIS — M5136 Other intervertebral disc degeneration, lumbar region: Secondary | ICD-10-CM | POA: Diagnosis not present

## 2017-06-29 DIAGNOSIS — N3090 Cystitis, unspecified without hematuria: Secondary | ICD-10-CM | POA: Diagnosis not present

## 2017-06-29 DIAGNOSIS — M9903 Segmental and somatic dysfunction of lumbar region: Secondary | ICD-10-CM | POA: Diagnosis not present

## 2017-07-04 DIAGNOSIS — M5136 Other intervertebral disc degeneration, lumbar region: Secondary | ICD-10-CM | POA: Diagnosis not present

## 2017-07-04 DIAGNOSIS — M9903 Segmental and somatic dysfunction of lumbar region: Secondary | ICD-10-CM | POA: Diagnosis not present

## 2017-07-11 DIAGNOSIS — N951 Menopausal and female climacteric states: Secondary | ICD-10-CM | POA: Diagnosis not present

## 2017-07-11 DIAGNOSIS — R635 Abnormal weight gain: Secondary | ICD-10-CM | POA: Diagnosis not present

## 2017-07-12 DIAGNOSIS — N898 Other specified noninflammatory disorders of vagina: Secondary | ICD-10-CM | POA: Diagnosis not present

## 2017-07-12 DIAGNOSIS — Z1339 Encounter for screening examination for other mental health and behavioral disorders: Secondary | ICD-10-CM | POA: Diagnosis not present

## 2017-07-12 DIAGNOSIS — R5383 Other fatigue: Secondary | ICD-10-CM | POA: Diagnosis not present

## 2017-07-12 DIAGNOSIS — M2550 Pain in unspecified joint: Secondary | ICD-10-CM | POA: Diagnosis not present

## 2017-07-12 DIAGNOSIS — I10 Essential (primary) hypertension: Secondary | ICD-10-CM | POA: Diagnosis not present

## 2017-07-12 DIAGNOSIS — Z1331 Encounter for screening for depression: Secondary | ICD-10-CM | POA: Diagnosis not present

## 2017-07-12 DIAGNOSIS — R4586 Emotional lability: Secondary | ICD-10-CM | POA: Diagnosis not present

## 2017-07-12 DIAGNOSIS — Z72820 Sleep deprivation: Secondary | ICD-10-CM | POA: Diagnosis not present

## 2017-07-12 DIAGNOSIS — Z6833 Body mass index (BMI) 33.0-33.9, adult: Secondary | ICD-10-CM | POA: Diagnosis not present

## 2017-07-12 DIAGNOSIS — N951 Menopausal and female climacteric states: Secondary | ICD-10-CM | POA: Diagnosis not present

## 2017-07-12 DIAGNOSIS — R232 Flushing: Secondary | ICD-10-CM | POA: Diagnosis not present

## 2017-07-17 DIAGNOSIS — L812 Freckles: Secondary | ICD-10-CM | POA: Diagnosis not present

## 2017-07-17 DIAGNOSIS — L821 Other seborrheic keratosis: Secondary | ICD-10-CM | POA: Diagnosis not present

## 2017-07-17 DIAGNOSIS — M2550 Pain in unspecified joint: Secondary | ICD-10-CM | POA: Diagnosis not present

## 2017-07-17 DIAGNOSIS — D1801 Hemangioma of skin and subcutaneous tissue: Secondary | ICD-10-CM | POA: Diagnosis not present

## 2017-07-17 DIAGNOSIS — L820 Inflamed seborrheic keratosis: Secondary | ICD-10-CM | POA: Diagnosis not present

## 2017-07-17 DIAGNOSIS — Z713 Dietary counseling and surveillance: Secondary | ICD-10-CM | POA: Diagnosis not present

## 2017-07-17 DIAGNOSIS — Z6833 Body mass index (BMI) 33.0-33.9, adult: Secondary | ICD-10-CM | POA: Diagnosis not present

## 2017-07-18 DIAGNOSIS — M5136 Other intervertebral disc degeneration, lumbar region: Secondary | ICD-10-CM | POA: Diagnosis not present

## 2017-07-18 DIAGNOSIS — M9903 Segmental and somatic dysfunction of lumbar region: Secondary | ICD-10-CM | POA: Diagnosis not present

## 2017-07-25 DIAGNOSIS — Z6831 Body mass index (BMI) 31.0-31.9, adult: Secondary | ICD-10-CM | POA: Diagnosis not present

## 2017-07-25 DIAGNOSIS — Z713 Dietary counseling and surveillance: Secondary | ICD-10-CM | POA: Diagnosis not present

## 2017-07-25 DIAGNOSIS — K219 Gastro-esophageal reflux disease without esophagitis: Secondary | ICD-10-CM | POA: Diagnosis not present

## 2017-07-25 DIAGNOSIS — I10 Essential (primary) hypertension: Secondary | ICD-10-CM | POA: Diagnosis not present

## 2017-08-01 DIAGNOSIS — Z713 Dietary counseling and surveillance: Secondary | ICD-10-CM | POA: Diagnosis not present

## 2017-08-01 DIAGNOSIS — Z6831 Body mass index (BMI) 31.0-31.9, adult: Secondary | ICD-10-CM | POA: Diagnosis not present

## 2017-08-01 DIAGNOSIS — I10 Essential (primary) hypertension: Secondary | ICD-10-CM | POA: Diagnosis not present

## 2017-08-08 DIAGNOSIS — M5136 Other intervertebral disc degeneration, lumbar region: Secondary | ICD-10-CM | POA: Diagnosis not present

## 2017-08-08 DIAGNOSIS — N951 Menopausal and female climacteric states: Secondary | ICD-10-CM | POA: Diagnosis not present

## 2017-08-08 DIAGNOSIS — M2550 Pain in unspecified joint: Secondary | ICD-10-CM | POA: Diagnosis not present

## 2017-08-08 DIAGNOSIS — Z713 Dietary counseling and surveillance: Secondary | ICD-10-CM | POA: Diagnosis not present

## 2017-08-08 DIAGNOSIS — K219 Gastro-esophageal reflux disease without esophagitis: Secondary | ICD-10-CM | POA: Diagnosis not present

## 2017-08-08 DIAGNOSIS — Z6831 Body mass index (BMI) 31.0-31.9, adult: Secondary | ICD-10-CM | POA: Diagnosis not present

## 2017-08-08 DIAGNOSIS — I10 Essential (primary) hypertension: Secondary | ICD-10-CM | POA: Diagnosis not present

## 2017-08-08 DIAGNOSIS — M9903 Segmental and somatic dysfunction of lumbar region: Secondary | ICD-10-CM | POA: Diagnosis not present

## 2017-08-09 DIAGNOSIS — M1712 Unilateral primary osteoarthritis, left knee: Secondary | ICD-10-CM | POA: Insufficient documentation

## 2017-08-09 DIAGNOSIS — M25562 Pain in left knee: Secondary | ICD-10-CM | POA: Diagnosis not present

## 2017-08-09 HISTORY — DX: Unilateral primary osteoarthritis, left knee: M17.12

## 2017-08-16 DIAGNOSIS — M2550 Pain in unspecified joint: Secondary | ICD-10-CM | POA: Diagnosis not present

## 2017-08-16 DIAGNOSIS — Z713 Dietary counseling and surveillance: Secondary | ICD-10-CM | POA: Diagnosis not present

## 2017-08-21 DIAGNOSIS — Z713 Dietary counseling and surveillance: Secondary | ICD-10-CM | POA: Diagnosis not present

## 2017-08-21 DIAGNOSIS — Z6831 Body mass index (BMI) 31.0-31.9, adult: Secondary | ICD-10-CM | POA: Diagnosis not present

## 2017-08-21 DIAGNOSIS — I10 Essential (primary) hypertension: Secondary | ICD-10-CM | POA: Diagnosis not present

## 2017-08-29 DIAGNOSIS — M5136 Other intervertebral disc degeneration, lumbar region: Secondary | ICD-10-CM | POA: Diagnosis not present

## 2017-08-29 DIAGNOSIS — M9903 Segmental and somatic dysfunction of lumbar region: Secondary | ICD-10-CM | POA: Diagnosis not present

## 2017-08-29 DIAGNOSIS — M2550 Pain in unspecified joint: Secondary | ICD-10-CM | POA: Diagnosis not present

## 2017-08-29 DIAGNOSIS — Z6830 Body mass index (BMI) 30.0-30.9, adult: Secondary | ICD-10-CM | POA: Diagnosis not present

## 2017-08-29 DIAGNOSIS — Z713 Dietary counseling and surveillance: Secondary | ICD-10-CM | POA: Diagnosis not present

## 2017-09-05 DIAGNOSIS — Z713 Dietary counseling and surveillance: Secondary | ICD-10-CM | POA: Diagnosis not present

## 2017-09-05 DIAGNOSIS — I10 Essential (primary) hypertension: Secondary | ICD-10-CM | POA: Diagnosis not present

## 2017-09-05 DIAGNOSIS — Z6830 Body mass index (BMI) 30.0-30.9, adult: Secondary | ICD-10-CM | POA: Diagnosis not present

## 2017-09-11 DIAGNOSIS — M2550 Pain in unspecified joint: Secondary | ICD-10-CM | POA: Diagnosis not present

## 2017-09-11 DIAGNOSIS — Z6830 Body mass index (BMI) 30.0-30.9, adult: Secondary | ICD-10-CM | POA: Diagnosis not present

## 2017-09-11 DIAGNOSIS — M9903 Segmental and somatic dysfunction of lumbar region: Secondary | ICD-10-CM | POA: Diagnosis not present

## 2017-09-11 DIAGNOSIS — Z713 Dietary counseling and surveillance: Secondary | ICD-10-CM | POA: Diagnosis not present

## 2017-09-11 DIAGNOSIS — M5136 Other intervertebral disc degeneration, lumbar region: Secondary | ICD-10-CM | POA: Diagnosis not present

## 2017-09-19 DIAGNOSIS — Z713 Dietary counseling and surveillance: Secondary | ICD-10-CM | POA: Diagnosis not present

## 2017-09-19 DIAGNOSIS — Z6830 Body mass index (BMI) 30.0-30.9, adult: Secondary | ICD-10-CM | POA: Diagnosis not present

## 2017-09-19 DIAGNOSIS — I10 Essential (primary) hypertension: Secondary | ICD-10-CM | POA: Diagnosis not present

## 2017-09-25 DIAGNOSIS — H5111 Convergence insufficiency: Secondary | ICD-10-CM | POA: Diagnosis not present

## 2017-09-26 DIAGNOSIS — Z6829 Body mass index (BMI) 29.0-29.9, adult: Secondary | ICD-10-CM | POA: Diagnosis not present

## 2017-09-26 DIAGNOSIS — Z713 Dietary counseling and surveillance: Secondary | ICD-10-CM | POA: Diagnosis not present

## 2017-09-26 DIAGNOSIS — I10 Essential (primary) hypertension: Secondary | ICD-10-CM | POA: Diagnosis not present

## 2017-09-26 DIAGNOSIS — E039 Hypothyroidism, unspecified: Secondary | ICD-10-CM | POA: Diagnosis not present

## 2017-09-26 DIAGNOSIS — R635 Abnormal weight gain: Secondary | ICD-10-CM | POA: Diagnosis not present

## 2017-09-27 DIAGNOSIS — H5015 Alternating exotropia: Secondary | ICD-10-CM | POA: Diagnosis not present

## 2017-09-28 DIAGNOSIS — M5136 Other intervertebral disc degeneration, lumbar region: Secondary | ICD-10-CM | POA: Diagnosis not present

## 2017-09-28 DIAGNOSIS — M9903 Segmental and somatic dysfunction of lumbar region: Secondary | ICD-10-CM | POA: Diagnosis not present

## 2017-10-03 DIAGNOSIS — M2550 Pain in unspecified joint: Secondary | ICD-10-CM | POA: Diagnosis not present

## 2017-10-03 DIAGNOSIS — Z713 Dietary counseling and surveillance: Secondary | ICD-10-CM | POA: Diagnosis not present

## 2017-10-03 DIAGNOSIS — Z6830 Body mass index (BMI) 30.0-30.9, adult: Secondary | ICD-10-CM | POA: Diagnosis not present

## 2017-10-03 DIAGNOSIS — K5900 Constipation, unspecified: Secondary | ICD-10-CM | POA: Diagnosis not present

## 2017-10-03 DIAGNOSIS — I10 Essential (primary) hypertension: Secondary | ICD-10-CM | POA: Diagnosis not present

## 2017-10-06 DIAGNOSIS — E7849 Other hyperlipidemia: Secondary | ICD-10-CM | POA: Diagnosis not present

## 2017-10-06 DIAGNOSIS — M2550 Pain in unspecified joint: Secondary | ICD-10-CM | POA: Diagnosis not present

## 2017-10-06 DIAGNOSIS — I10 Essential (primary) hypertension: Secondary | ICD-10-CM | POA: Diagnosis not present

## 2017-10-06 DIAGNOSIS — E669 Obesity, unspecified: Secondary | ICD-10-CM | POA: Diagnosis not present

## 2017-10-10 DIAGNOSIS — Z713 Dietary counseling and surveillance: Secondary | ICD-10-CM | POA: Diagnosis not present

## 2017-10-10 DIAGNOSIS — M2550 Pain in unspecified joint: Secondary | ICD-10-CM | POA: Diagnosis not present

## 2017-10-10 DIAGNOSIS — I10 Essential (primary) hypertension: Secondary | ICD-10-CM | POA: Diagnosis not present

## 2017-10-10 DIAGNOSIS — K5900 Constipation, unspecified: Secondary | ICD-10-CM | POA: Diagnosis not present

## 2017-10-12 DIAGNOSIS — M9903 Segmental and somatic dysfunction of lumbar region: Secondary | ICD-10-CM | POA: Diagnosis not present

## 2017-10-12 DIAGNOSIS — M5136 Other intervertebral disc degeneration, lumbar region: Secondary | ICD-10-CM | POA: Diagnosis not present

## 2017-10-17 DIAGNOSIS — C44629 Squamous cell carcinoma of skin of left upper limb, including shoulder: Secondary | ICD-10-CM | POA: Diagnosis not present

## 2017-10-17 DIAGNOSIS — D485 Neoplasm of uncertain behavior of skin: Secondary | ICD-10-CM | POA: Diagnosis not present

## 2017-10-17 DIAGNOSIS — L723 Sebaceous cyst: Secondary | ICD-10-CM | POA: Diagnosis not present

## 2017-10-17 DIAGNOSIS — L821 Other seborrheic keratosis: Secondary | ICD-10-CM | POA: Diagnosis not present

## 2017-10-20 DIAGNOSIS — Z0000 Encounter for general adult medical examination without abnormal findings: Secondary | ICD-10-CM | POA: Diagnosis not present

## 2017-10-21 DIAGNOSIS — R52 Pain, unspecified: Secondary | ICD-10-CM | POA: Diagnosis not present

## 2017-10-21 DIAGNOSIS — S0990XA Unspecified injury of head, initial encounter: Secondary | ICD-10-CM | POA: Diagnosis not present

## 2017-10-21 DIAGNOSIS — R51 Headache: Secondary | ICD-10-CM | POA: Diagnosis not present

## 2017-10-21 DIAGNOSIS — I10 Essential (primary) hypertension: Secondary | ICD-10-CM | POA: Diagnosis not present

## 2017-10-21 DIAGNOSIS — S0003XA Contusion of scalp, initial encounter: Secondary | ICD-10-CM | POA: Diagnosis not present

## 2017-10-21 DIAGNOSIS — G4489 Other headache syndrome: Secondary | ICD-10-CM | POA: Diagnosis not present

## 2017-10-21 DIAGNOSIS — M542 Cervicalgia: Secondary | ICD-10-CM | POA: Diagnosis not present

## 2017-10-21 DIAGNOSIS — R42 Dizziness and giddiness: Secondary | ICD-10-CM | POA: Diagnosis not present

## 2017-10-21 DIAGNOSIS — S199XXA Unspecified injury of neck, initial encounter: Secondary | ICD-10-CM | POA: Diagnosis not present

## 2017-10-23 DIAGNOSIS — M9903 Segmental and somatic dysfunction of lumbar region: Secondary | ICD-10-CM | POA: Diagnosis not present

## 2017-10-23 DIAGNOSIS — M5136 Other intervertebral disc degeneration, lumbar region: Secondary | ICD-10-CM | POA: Diagnosis not present

## 2017-10-24 DIAGNOSIS — S0003XA Contusion of scalp, initial encounter: Secondary | ICD-10-CM | POA: Diagnosis not present

## 2017-10-24 DIAGNOSIS — M9903 Segmental and somatic dysfunction of lumbar region: Secondary | ICD-10-CM | POA: Diagnosis not present

## 2017-10-24 DIAGNOSIS — M5136 Other intervertebral disc degeneration, lumbar region: Secondary | ICD-10-CM | POA: Diagnosis not present

## 2017-10-24 DIAGNOSIS — I10 Essential (primary) hypertension: Secondary | ICD-10-CM | POA: Diagnosis not present

## 2017-10-24 DIAGNOSIS — Z6827 Body mass index (BMI) 27.0-27.9, adult: Secondary | ICD-10-CM | POA: Diagnosis not present

## 2017-10-26 DIAGNOSIS — M9903 Segmental and somatic dysfunction of lumbar region: Secondary | ICD-10-CM | POA: Diagnosis not present

## 2017-10-26 DIAGNOSIS — M5136 Other intervertebral disc degeneration, lumbar region: Secondary | ICD-10-CM | POA: Diagnosis not present

## 2017-10-31 DIAGNOSIS — N904 Leukoplakia of vulva: Secondary | ICD-10-CM | POA: Diagnosis not present

## 2017-10-31 DIAGNOSIS — Z713 Dietary counseling and surveillance: Secondary | ICD-10-CM | POA: Diagnosis not present

## 2017-10-31 DIAGNOSIS — M5136 Other intervertebral disc degeneration, lumbar region: Secondary | ICD-10-CM | POA: Diagnosis not present

## 2017-10-31 DIAGNOSIS — M9903 Segmental and somatic dysfunction of lumbar region: Secondary | ICD-10-CM | POA: Diagnosis not present

## 2017-10-31 DIAGNOSIS — I10 Essential (primary) hypertension: Secondary | ICD-10-CM | POA: Diagnosis not present

## 2017-10-31 DIAGNOSIS — N9089 Other specified noninflammatory disorders of vulva and perineum: Secondary | ICD-10-CM | POA: Diagnosis not present

## 2017-10-31 DIAGNOSIS — Z6828 Body mass index (BMI) 28.0-28.9, adult: Secondary | ICD-10-CM | POA: Diagnosis not present

## 2017-10-31 DIAGNOSIS — K219 Gastro-esophageal reflux disease without esophagitis: Secondary | ICD-10-CM | POA: Diagnosis not present

## 2017-10-31 DIAGNOSIS — M2550 Pain in unspecified joint: Secondary | ICD-10-CM | POA: Diagnosis not present

## 2017-11-08 DIAGNOSIS — M9903 Segmental and somatic dysfunction of lumbar region: Secondary | ICD-10-CM | POA: Diagnosis not present

## 2017-11-08 DIAGNOSIS — M5136 Other intervertebral disc degeneration, lumbar region: Secondary | ICD-10-CM | POA: Diagnosis not present

## 2017-11-14 DIAGNOSIS — M5136 Other intervertebral disc degeneration, lumbar region: Secondary | ICD-10-CM | POA: Diagnosis not present

## 2017-11-14 DIAGNOSIS — M9903 Segmental and somatic dysfunction of lumbar region: Secondary | ICD-10-CM | POA: Diagnosis not present

## 2017-11-21 DIAGNOSIS — M5136 Other intervertebral disc degeneration, lumbar region: Secondary | ICD-10-CM | POA: Diagnosis not present

## 2017-11-21 DIAGNOSIS — M9903 Segmental and somatic dysfunction of lumbar region: Secondary | ICD-10-CM | POA: Diagnosis not present

## 2017-11-23 DIAGNOSIS — M5136 Other intervertebral disc degeneration, lumbar region: Secondary | ICD-10-CM | POA: Diagnosis not present

## 2017-11-23 DIAGNOSIS — M9903 Segmental and somatic dysfunction of lumbar region: Secondary | ICD-10-CM | POA: Diagnosis not present

## 2017-11-24 DIAGNOSIS — I10 Essential (primary) hypertension: Secondary | ICD-10-CM | POA: Diagnosis not present

## 2017-11-24 DIAGNOSIS — Z6828 Body mass index (BMI) 28.0-28.9, adult: Secondary | ICD-10-CM | POA: Diagnosis not present

## 2017-11-24 DIAGNOSIS — K219 Gastro-esophageal reflux disease without esophagitis: Secondary | ICD-10-CM | POA: Diagnosis not present

## 2017-11-24 DIAGNOSIS — Z713 Dietary counseling and surveillance: Secondary | ICD-10-CM | POA: Diagnosis not present

## 2017-11-24 DIAGNOSIS — M2550 Pain in unspecified joint: Secondary | ICD-10-CM | POA: Diagnosis not present

## 2017-11-27 DIAGNOSIS — Z6828 Body mass index (BMI) 28.0-28.9, adult: Secondary | ICD-10-CM | POA: Diagnosis not present

## 2017-11-27 DIAGNOSIS — M542 Cervicalgia: Secondary | ICD-10-CM | POA: Diagnosis not present

## 2017-11-27 DIAGNOSIS — I10 Essential (primary) hypertension: Secondary | ICD-10-CM | POA: Diagnosis not present

## 2017-11-27 DIAGNOSIS — Z1339 Encounter for screening examination for other mental health and behavioral disorders: Secondary | ICD-10-CM | POA: Diagnosis not present

## 2017-12-07 DIAGNOSIS — H1131 Conjunctival hemorrhage, right eye: Secondary | ICD-10-CM | POA: Diagnosis not present

## 2017-12-08 DIAGNOSIS — H1131 Conjunctival hemorrhage, right eye: Secondary | ICD-10-CM | POA: Diagnosis not present

## 2017-12-11 DIAGNOSIS — M9903 Segmental and somatic dysfunction of lumbar region: Secondary | ICD-10-CM | POA: Diagnosis not present

## 2017-12-11 DIAGNOSIS — M5136 Other intervertebral disc degeneration, lumbar region: Secondary | ICD-10-CM | POA: Diagnosis not present

## 2017-12-25 DIAGNOSIS — M9903 Segmental and somatic dysfunction of lumbar region: Secondary | ICD-10-CM | POA: Diagnosis not present

## 2017-12-25 DIAGNOSIS — M5136 Other intervertebral disc degeneration, lumbar region: Secondary | ICD-10-CM | POA: Diagnosis not present

## 2018-01-08 DIAGNOSIS — M5136 Other intervertebral disc degeneration, lumbar region: Secondary | ICD-10-CM | POA: Diagnosis not present

## 2018-01-08 DIAGNOSIS — M9903 Segmental and somatic dysfunction of lumbar region: Secondary | ICD-10-CM | POA: Diagnosis not present

## 2018-01-09 DIAGNOSIS — Z6829 Body mass index (BMI) 29.0-29.9, adult: Secondary | ICD-10-CM | POA: Diagnosis not present

## 2018-01-09 DIAGNOSIS — Z779 Other contact with and (suspected) exposures hazardous to health: Secondary | ICD-10-CM | POA: Diagnosis not present

## 2018-01-09 DIAGNOSIS — N9089 Other specified noninflammatory disorders of vulva and perineum: Secondary | ICD-10-CM | POA: Diagnosis not present

## 2018-01-09 DIAGNOSIS — Z1231 Encounter for screening mammogram for malignant neoplasm of breast: Secondary | ICD-10-CM | POA: Diagnosis not present

## 2018-01-30 DIAGNOSIS — M5136 Other intervertebral disc degeneration, lumbar region: Secondary | ICD-10-CM | POA: Diagnosis not present

## 2018-01-30 DIAGNOSIS — M9903 Segmental and somatic dysfunction of lumbar region: Secondary | ICD-10-CM | POA: Diagnosis not present

## 2018-02-06 DIAGNOSIS — M5136 Other intervertebral disc degeneration, lumbar region: Secondary | ICD-10-CM | POA: Diagnosis not present

## 2018-02-06 DIAGNOSIS — M9903 Segmental and somatic dysfunction of lumbar region: Secondary | ICD-10-CM | POA: Diagnosis not present

## 2018-02-14 DIAGNOSIS — M5136 Other intervertebral disc degeneration, lumbar region: Secondary | ICD-10-CM | POA: Diagnosis not present

## 2018-02-14 DIAGNOSIS — M9903 Segmental and somatic dysfunction of lumbar region: Secondary | ICD-10-CM | POA: Diagnosis not present

## 2018-03-01 DIAGNOSIS — M9903 Segmental and somatic dysfunction of lumbar region: Secondary | ICD-10-CM | POA: Diagnosis not present

## 2018-03-01 DIAGNOSIS — M5136 Other intervertebral disc degeneration, lumbar region: Secondary | ICD-10-CM | POA: Diagnosis not present

## 2018-04-03 DIAGNOSIS — M9903 Segmental and somatic dysfunction of lumbar region: Secondary | ICD-10-CM | POA: Diagnosis not present

## 2018-04-03 DIAGNOSIS — M5136 Other intervertebral disc degeneration, lumbar region: Secondary | ICD-10-CM | POA: Diagnosis not present

## 2018-04-19 DIAGNOSIS — M9903 Segmental and somatic dysfunction of lumbar region: Secondary | ICD-10-CM | POA: Diagnosis not present

## 2018-04-19 DIAGNOSIS — M5136 Other intervertebral disc degeneration, lumbar region: Secondary | ICD-10-CM | POA: Diagnosis not present

## 2018-05-15 DIAGNOSIS — M5136 Other intervertebral disc degeneration, lumbar region: Secondary | ICD-10-CM | POA: Diagnosis not present

## 2018-05-15 DIAGNOSIS — M9903 Segmental and somatic dysfunction of lumbar region: Secondary | ICD-10-CM | POA: Diagnosis not present

## 2018-05-31 DIAGNOSIS — M5136 Other intervertebral disc degeneration, lumbar region: Secondary | ICD-10-CM | POA: Diagnosis not present

## 2018-05-31 DIAGNOSIS — M9903 Segmental and somatic dysfunction of lumbar region: Secondary | ICD-10-CM | POA: Diagnosis not present

## 2018-06-13 DIAGNOSIS — L820 Inflamed seborrheic keratosis: Secondary | ICD-10-CM | POA: Diagnosis not present

## 2018-06-13 DIAGNOSIS — L821 Other seborrheic keratosis: Secondary | ICD-10-CM | POA: Diagnosis not present

## 2018-06-13 DIAGNOSIS — Z85828 Personal history of other malignant neoplasm of skin: Secondary | ICD-10-CM | POA: Diagnosis not present

## 2018-06-21 DIAGNOSIS — M2550 Pain in unspecified joint: Secondary | ICD-10-CM | POA: Diagnosis not present

## 2018-06-21 DIAGNOSIS — E7849 Other hyperlipidemia: Secondary | ICD-10-CM | POA: Diagnosis not present

## 2018-06-21 DIAGNOSIS — M542 Cervicalgia: Secondary | ICD-10-CM | POA: Diagnosis not present

## 2018-06-21 DIAGNOSIS — I10 Essential (primary) hypertension: Secondary | ICD-10-CM | POA: Diagnosis not present

## 2018-06-28 DIAGNOSIS — M2560 Stiffness of unspecified joint, not elsewhere classified: Secondary | ICD-10-CM | POA: Diagnosis not present

## 2018-06-28 DIAGNOSIS — M542 Cervicalgia: Secondary | ICD-10-CM | POA: Diagnosis not present

## 2018-06-28 DIAGNOSIS — M6281 Muscle weakness (generalized): Secondary | ICD-10-CM | POA: Diagnosis not present

## 2018-07-02 DIAGNOSIS — M9903 Segmental and somatic dysfunction of lumbar region: Secondary | ICD-10-CM | POA: Diagnosis not present

## 2018-07-02 DIAGNOSIS — M5136 Other intervertebral disc degeneration, lumbar region: Secondary | ICD-10-CM | POA: Diagnosis not present

## 2018-07-03 DIAGNOSIS — M2560 Stiffness of unspecified joint, not elsewhere classified: Secondary | ICD-10-CM | POA: Diagnosis not present

## 2018-07-03 DIAGNOSIS — M542 Cervicalgia: Secondary | ICD-10-CM | POA: Diagnosis not present

## 2018-07-03 DIAGNOSIS — M6281 Muscle weakness (generalized): Secondary | ICD-10-CM | POA: Diagnosis not present

## 2018-07-05 DIAGNOSIS — M542 Cervicalgia: Secondary | ICD-10-CM | POA: Diagnosis not present

## 2018-07-05 DIAGNOSIS — M2560 Stiffness of unspecified joint, not elsewhere classified: Secondary | ICD-10-CM | POA: Diagnosis not present

## 2018-07-05 DIAGNOSIS — M6281 Muscle weakness (generalized): Secondary | ICD-10-CM | POA: Diagnosis not present

## 2018-07-09 DIAGNOSIS — M542 Cervicalgia: Secondary | ICD-10-CM | POA: Diagnosis not present

## 2018-07-09 DIAGNOSIS — M2560 Stiffness of unspecified joint, not elsewhere classified: Secondary | ICD-10-CM | POA: Diagnosis not present

## 2018-07-09 DIAGNOSIS — M6281 Muscle weakness (generalized): Secondary | ICD-10-CM | POA: Diagnosis not present

## 2018-07-11 DIAGNOSIS — M2560 Stiffness of unspecified joint, not elsewhere classified: Secondary | ICD-10-CM | POA: Diagnosis not present

## 2018-07-11 DIAGNOSIS — M542 Cervicalgia: Secondary | ICD-10-CM | POA: Diagnosis not present

## 2018-07-11 DIAGNOSIS — M6281 Muscle weakness (generalized): Secondary | ICD-10-CM | POA: Diagnosis not present

## 2018-07-12 DIAGNOSIS — M9903 Segmental and somatic dysfunction of lumbar region: Secondary | ICD-10-CM | POA: Diagnosis not present

## 2018-07-12 DIAGNOSIS — M5136 Other intervertebral disc degeneration, lumbar region: Secondary | ICD-10-CM | POA: Diagnosis not present

## 2018-07-17 DIAGNOSIS — M542 Cervicalgia: Secondary | ICD-10-CM | POA: Diagnosis not present

## 2018-07-17 DIAGNOSIS — I10 Essential (primary) hypertension: Secondary | ICD-10-CM | POA: Diagnosis not present

## 2018-07-19 DIAGNOSIS — M5136 Other intervertebral disc degeneration, lumbar region: Secondary | ICD-10-CM | POA: Diagnosis not present

## 2018-07-19 DIAGNOSIS — M542 Cervicalgia: Secondary | ICD-10-CM | POA: Diagnosis not present

## 2018-07-19 DIAGNOSIS — I10 Essential (primary) hypertension: Secondary | ICD-10-CM | POA: Diagnosis not present

## 2018-07-19 DIAGNOSIS — M9903 Segmental and somatic dysfunction of lumbar region: Secondary | ICD-10-CM | POA: Diagnosis not present

## 2018-07-26 DIAGNOSIS — M542 Cervicalgia: Secondary | ICD-10-CM | POA: Diagnosis not present

## 2018-08-01 DIAGNOSIS — M542 Cervicalgia: Secondary | ICD-10-CM | POA: Diagnosis not present

## 2018-08-06 DIAGNOSIS — M542 Cervicalgia: Secondary | ICD-10-CM | POA: Diagnosis not present

## 2018-08-07 DIAGNOSIS — M9903 Segmental and somatic dysfunction of lumbar region: Secondary | ICD-10-CM | POA: Diagnosis not present

## 2018-08-07 DIAGNOSIS — M5136 Other intervertebral disc degeneration, lumbar region: Secondary | ICD-10-CM | POA: Diagnosis not present

## 2018-08-09 DIAGNOSIS — M542 Cervicalgia: Secondary | ICD-10-CM | POA: Diagnosis not present

## 2018-08-15 DIAGNOSIS — M1712 Unilateral primary osteoarthritis, left knee: Secondary | ICD-10-CM | POA: Diagnosis not present

## 2018-08-15 DIAGNOSIS — M1711 Unilateral primary osteoarthritis, right knee: Secondary | ICD-10-CM | POA: Diagnosis not present

## 2018-08-15 DIAGNOSIS — M170 Bilateral primary osteoarthritis of knee: Secondary | ICD-10-CM | POA: Diagnosis not present

## 2018-08-16 DIAGNOSIS — M542 Cervicalgia: Secondary | ICD-10-CM | POA: Diagnosis not present

## 2018-08-21 DIAGNOSIS — M542 Cervicalgia: Secondary | ICD-10-CM | POA: Diagnosis not present

## 2018-08-27 DIAGNOSIS — M542 Cervicalgia: Secondary | ICD-10-CM | POA: Diagnosis not present

## 2018-08-29 DIAGNOSIS — M542 Cervicalgia: Secondary | ICD-10-CM | POA: Diagnosis not present

## 2018-08-30 DIAGNOSIS — M5136 Other intervertebral disc degeneration, lumbar region: Secondary | ICD-10-CM | POA: Diagnosis not present

## 2018-08-30 DIAGNOSIS — M9903 Segmental and somatic dysfunction of lumbar region: Secondary | ICD-10-CM | POA: Diagnosis not present

## 2018-09-27 DIAGNOSIS — M9903 Segmental and somatic dysfunction of lumbar region: Secondary | ICD-10-CM | POA: Diagnosis not present

## 2018-09-27 DIAGNOSIS — M5136 Other intervertebral disc degeneration, lumbar region: Secondary | ICD-10-CM | POA: Diagnosis not present

## 2018-10-03 DIAGNOSIS — M1711 Unilateral primary osteoarthritis, right knee: Secondary | ICD-10-CM | POA: Insufficient documentation

## 2018-10-03 HISTORY — DX: Unilateral primary osteoarthritis, right knee: M17.11

## 2018-10-04 DIAGNOSIS — M170 Bilateral primary osteoarthritis of knee: Secondary | ICD-10-CM | POA: Diagnosis not present

## 2018-10-23 DIAGNOSIS — M9903 Segmental and somatic dysfunction of lumbar region: Secondary | ICD-10-CM | POA: Diagnosis not present

## 2018-10-23 DIAGNOSIS — M5136 Other intervertebral disc degeneration, lumbar region: Secondary | ICD-10-CM | POA: Diagnosis not present

## 2018-10-31 DIAGNOSIS — M9903 Segmental and somatic dysfunction of lumbar region: Secondary | ICD-10-CM | POA: Diagnosis not present

## 2018-10-31 DIAGNOSIS — M5136 Other intervertebral disc degeneration, lumbar region: Secondary | ICD-10-CM | POA: Diagnosis not present

## 2018-11-14 DIAGNOSIS — M5136 Other intervertebral disc degeneration, lumbar region: Secondary | ICD-10-CM | POA: Diagnosis not present

## 2018-11-14 DIAGNOSIS — M9903 Segmental and somatic dysfunction of lumbar region: Secondary | ICD-10-CM | POA: Diagnosis not present

## 2018-11-19 DIAGNOSIS — M170 Bilateral primary osteoarthritis of knee: Secondary | ICD-10-CM | POA: Diagnosis not present

## 2018-11-28 DIAGNOSIS — M5136 Other intervertebral disc degeneration, lumbar region: Secondary | ICD-10-CM | POA: Diagnosis not present

## 2018-11-28 DIAGNOSIS — M9903 Segmental and somatic dysfunction of lumbar region: Secondary | ICD-10-CM | POA: Diagnosis not present

## 2018-12-06 DIAGNOSIS — E785 Hyperlipidemia, unspecified: Secondary | ICD-10-CM | POA: Diagnosis not present

## 2018-12-06 DIAGNOSIS — Z9181 History of falling: Secondary | ICD-10-CM | POA: Diagnosis not present

## 2018-12-06 DIAGNOSIS — Z1331 Encounter for screening for depression: Secondary | ICD-10-CM | POA: Diagnosis not present

## 2018-12-06 DIAGNOSIS — Z136 Encounter for screening for cardiovascular disorders: Secondary | ICD-10-CM | POA: Diagnosis not present

## 2018-12-06 DIAGNOSIS — Z139 Encounter for screening, unspecified: Secondary | ICD-10-CM | POA: Diagnosis not present

## 2018-12-06 DIAGNOSIS — Z0000 Encounter for general adult medical examination without abnormal findings: Secondary | ICD-10-CM | POA: Diagnosis not present

## 2019-02-12 DIAGNOSIS — M810 Age-related osteoporosis without current pathological fracture: Secondary | ICD-10-CM

## 2019-02-12 HISTORY — DX: Age-related osteoporosis without current pathological fracture: M81.0

## 2019-06-04 DIAGNOSIS — M9903 Segmental and somatic dysfunction of lumbar region: Secondary | ICD-10-CM | POA: Diagnosis not present

## 2019-06-04 DIAGNOSIS — M5136 Other intervertebral disc degeneration, lumbar region: Secondary | ICD-10-CM | POA: Diagnosis not present

## 2019-06-18 DIAGNOSIS — M9903 Segmental and somatic dysfunction of lumbar region: Secondary | ICD-10-CM | POA: Diagnosis not present

## 2019-06-18 DIAGNOSIS — M5136 Other intervertebral disc degeneration, lumbar region: Secondary | ICD-10-CM | POA: Diagnosis not present

## 2019-07-01 ENCOUNTER — Encounter: Payer: Self-pay | Admitting: *Deleted

## 2019-07-01 ENCOUNTER — Encounter: Payer: Self-pay | Admitting: Cardiology

## 2019-07-01 DIAGNOSIS — G4733 Obstructive sleep apnea (adult) (pediatric): Secondary | ICD-10-CM | POA: Insufficient documentation

## 2019-07-01 DIAGNOSIS — K449 Diaphragmatic hernia without obstruction or gangrene: Secondary | ICD-10-CM | POA: Insufficient documentation

## 2019-07-01 DIAGNOSIS — I10 Essential (primary) hypertension: Secondary | ICD-10-CM | POA: Insufficient documentation

## 2019-07-01 DIAGNOSIS — K219 Gastro-esophageal reflux disease without esophagitis: Secondary | ICD-10-CM | POA: Insufficient documentation

## 2019-07-01 DIAGNOSIS — G4700 Insomnia, unspecified: Secondary | ICD-10-CM | POA: Insufficient documentation

## 2019-07-01 DIAGNOSIS — D4959 Neoplasm of unspecified behavior of other genitourinary organ: Secondary | ICD-10-CM | POA: Insufficient documentation

## 2019-07-01 DIAGNOSIS — Z9889 Other specified postprocedural states: Secondary | ICD-10-CM | POA: Insufficient documentation

## 2019-07-01 DIAGNOSIS — M5136 Other intervertebral disc degeneration, lumbar region: Secondary | ICD-10-CM | POA: Insufficient documentation

## 2019-07-01 DIAGNOSIS — Z87412 Personal history of vulvar dysplasia: Secondary | ICD-10-CM | POA: Insufficient documentation

## 2019-07-01 HISTORY — DX: Neoplasm of unspecified behavior of other genitourinary organ: D49.59

## 2019-07-02 ENCOUNTER — Ambulatory Visit (INDEPENDENT_AMBULATORY_CARE_PROVIDER_SITE_OTHER): Payer: Medicare Other | Admitting: Cardiology

## 2019-07-02 ENCOUNTER — Other Ambulatory Visit: Payer: Self-pay

## 2019-07-02 ENCOUNTER — Encounter: Payer: Self-pay | Admitting: Cardiology

## 2019-07-02 VITALS — BP 150/80 | HR 73 | Ht 63.0 in | Wt 181.0 lb

## 2019-07-02 DIAGNOSIS — I10 Essential (primary) hypertension: Secondary | ICD-10-CM | POA: Diagnosis not present

## 2019-07-02 DIAGNOSIS — I119 Hypertensive heart disease without heart failure: Secondary | ICD-10-CM

## 2019-07-02 DIAGNOSIS — R011 Cardiac murmur, unspecified: Secondary | ICD-10-CM

## 2019-07-02 DIAGNOSIS — R0789 Other chest pain: Secondary | ICD-10-CM

## 2019-07-02 DIAGNOSIS — E782 Mixed hyperlipidemia: Secondary | ICD-10-CM

## 2019-07-02 DIAGNOSIS — R072 Precordial pain: Secondary | ICD-10-CM

## 2019-07-02 HISTORY — DX: Cardiac murmur, unspecified: R01.1

## 2019-07-02 HISTORY — DX: Other chest pain: R07.89

## 2019-07-02 NOTE — Progress Notes (Signed)
Cardiology Office Note:    Date:  07/02/2019   ID:  PAISLIE TESSLER, DOB Oct 01, 1945, MRN [id number]  PCP:  Lowella Dandy, NP  Cardiologist:  Jenean Lindau, MD   Referring MD: Lowella Dandy, NP    ASSESSMENT:    1. Mixed hyperlipidemia   2. Chest discomfort   3. Essential hypertension   4. Cardiac murmur    PLAN:    In order of problems listed above:  1. Primary prevention stressed with the patient.  Importance of compliance with diet medication stressed and she vocalized understanding.  Weight reduction was stressed.  Diet was emphasized. 2. Chest discomfort: She has multiple risk factors for coronary artery disease and to rule out obstructive coronary artery disease we will do a Lexiscan sestamibi.  I discussed this with her and she is agreeable 3. Essential hypertension: Blood pressure is stable.  She has an element of whitecoat hypertension and appears mildly anxious today. 4. Mixed dyslipidemia: Markedly high LDL.  I discussed this with her and to stratify her I will do a calcium score CT scan to decide lipid-lowering medications and therapy 5. Cardiac murmur: Echocardiogram will be done to assess murmur heard on auscultation Patient will be seen in follow-up appointment in 3 months or earlier if the patient has any concerns    Medication Adjustments/Labs and Tests Ordered: Current medicines are reviewed at length with the patient today.  Concerns regarding medicines are outlined above.  No orders of the defined types were placed in this encounter.  No orders of the defined types were placed in this encounter.    History of Present Illness:    MIAISABELLA BACORN is a 74 y.o. female who is being seen today for the evaluation of chest discomfort and hyperlipidemia at the request of Moon, Amy A, NP.  Patient is a pleasant 74 year old female.  She has past medical history of essential hypertension, dyslipidemia.  She leads a sedentary lifestyle because of orthopedic issues  involving her knees.  She has obstructive sleep apnea.  She mentions to me that she occasionally has chest discomfort.  This may or may not be related to exertion.  No orthopnea or PND.  She is an active lady otherwise.  At the time of my evaluation, the patient is alert awake oriented and in no distress.  No radiation of the symptoms to the neck or to the arms.  Past Medical History:  Diagnosis Date  . Anxiety state, unspecified 08/15/2013  . Bulging lumbar disc   . GERD (gastroesophageal reflux disease)   . Hiatal hernia   . Hyperlipidemia 08/15/2013  . Hypertension   . Insomnia   . Lumbar spondylolysis   . 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 Obstructive sleep apnea   . Other and unspecified hyperlipidemia   . Personal history of vulvar dysplasia   . PONV (postoperative nausea and vomiting)   . Unspecified essential hypertension 08/15/2013  . Unspecified hereditary and idiopathic peripheral neuropathy 08/15/2013    Past Surgical History:  Procedure Laterality Date  . ABDOMINAL HYSTERECTOMY    . APPENDECTOMY  1950  . CATARACT EXTRACTION Bilateral 2008  . KNEE SURGERY Left 2018   Arthroscopic knee surg for torn meniscus  . THYROID SURGERY  1980  . VULVECTOMY N/A 06/22/2012   Procedure: WIDE EXCISION VULVECTOMY;  Surgeon: Allyn Kenner, DO;  Location: [facility];  Service: Gynecology;  Laterality: N/A;  wide local excision of vulva  . 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 Acetylcysteine (NAC) 600 MG CAPS Take by mouth.  . Ascorbic Acid ([employer] VITAMIN C) 1000 MG CHEW Chew 1,000 mg by mouth daily.  Marland Kitchen b complex vitamins tablet Take 1 tablet by mouth daily.  . cholecalciferol (VITAMIN D3) 25 MCG (1000 UNIT) tablet Take 1,000 Units by mouth daily.  . Flaxseed, Linseed, (FLAX SEED OIL PO) Take 1,000 mg by mouth daily.   . fluticasone (FLONASE) 50 MCG/ACT nasal spray Place 2 sprays into both nostrils daily.  Marland Kitchen  ibuprofen (ADVIL,MOTRIN) 200 MG tablet Take 800 mg by mouth 2 (two) times daily as needed for pain.  . metoprolol (LOPRESSOR) 50 MG tablet Take 50 mg by mouth 2 (two) times daily. Take 1/2 tablet two times daily  . omeprazole (PRILOSEC) 20 MG capsule Take 20 mg by mouth daily.  Marland Kitchen zolpidem (AMBIEN) 10 MG tablet Take 10 mg by mouth at bedtime as needed for sleep.     Allergies:   Clindamycin/lincomycin and Latex   Social History   Socioeconomic History  . Marital status: Widowed    Spouse name: Not on file  . Number of children: 2  . Years of education: college  . Highest education level: Not on file  Occupational History  . Not on file  Tobacco Use  . Smoking status: Former Research scientist (life sciences)  . Smokeless tobacco: Never Used  Substance and Sexual Activity  . Alcohol use: Yes    Comment: socially  . Drug use: No  . Sexual activity: Yes    Birth control/protection: None  Other Topics Concern  . Not on file  Social History Narrative   Patient is widowed with 2 children.   Patient is right handed.   Patient has some college.   Patient drinks 1 cup daily.   Social Determinants of Health   Financial Resource Strain:   . Difficulty of Paying Living Expenses:   Food Insecurity:   . Worried About Charity fundraiser in the Last Year:   . Arboriculturist in the Last Year:   Transportation Needs:   . Film/video editor (Medical):   Marland Kitchen Lack of Transportation (Non-Medical):   Physical Activity:   . Days of Exercise per Week:   . Minutes of Exercise per Session:   Stress:   . Feeling of Stress :   Social Connections:   . Frequency of Communication with Friends and Family:   . Frequency of Social Gatherings with Friends and Family:   . Attends Religious Services:   . Active Member of Clubs or Organizations:   . Attends Archivist Meetings:   Marland Kitchen Marital Status:      Family History: The patient's family history includes Heart attack in her father; Heart disease in her  mother; Hypercholesterolemia in her father; Hypertension in her father and mother. There is no history of Migraines.  ROS:   Please see the history of present illness.    All other systems reviewed and are negative.  EKGs/Labs/Other Studies Reviewed:    The following studies were reviewed today: I discussed my findings with the patient at extensive length.  EKG was sinus rhythm and nonspecific ST-T changes   Recent Labs: No results found for requested labs within last 8760 hours.  Recent Lipid Panel No results found for: CHOL, TRIG, HDL, CHOLHDL, VLDL, LDLCALC, LDLDIRECT  Physical Exam:    VS:  BP (!) 150/80  Pulse 73   Ht 5\' 3"  (1.6 m)   Wt 181 lb (82.1 kg)   SpO2 99%   BMI 32.06 kg/m     Wt Readings from Last 3 Encounters:  07/02/19 181 lb (82.1 kg)  06/21/19 177 lb (80.3 kg)  08/15/13 173 lb (78.5 kg)     GEN: Patient is in no acute distress HEENT: Normal NECK: No JVD; No carotid bruits LYMPHATICS: No lymphadenopathy CARDIAC: S1 S2 regular, 2/6 systolic murmur at the apex. RESPIRATORY:  Clear to auscultation without rales, wheezing or rhonchi  ABDOMEN: Soft, non-tender, non-distended MUSCULOSKELETAL:  No edema; No deformity  SKIN: Warm and dry NEUROLOGIC:  Alert and oriented x 3 PSYCHIATRIC:  Normal affect    Signed, Jenean Lindau, MD  07/02/2019 3:19 PM    [facility]

## 2019-07-02 NOTE — Patient Instructions (Signed)
Medication Instructions:  No medication changes. *If you need a refill on your cardiac medications before your next appointment, please call your pharmacy*   Lab Work: None ordered If you have labs (blood work) drawn today and your tests are completely normal, you will receive your results only by: Marland Kitchen MyChart Message (if you have MyChart) OR . A paper copy in the mail If you have any lab test that is abnormal or we need to change your treatment, we will call you to review the results.   Testing/Procedures: Your physician has requested that you have an echocardiogram. Echocardiography is a painless test that uses sound waves to create images of your heart. It provides your doctor with information about the size and shape of your heart and how well your heart's chambers and valves are working. This procedure takes approximately one hour. There are no restrictions for this procedure.  Your physician has requested that you have a lexiscan myoview. For further information please visit [email or web address]. Please follow instruction sheet, as given.  The test will take approximately 3 to 4 hours to complete; you may bring reading material.  If someone comes with you to your appointment, they will need to remain in the main lobby due to limited space in the testing area.  How to prepare for your Myocardial Perfusion Test: . Do not eat or drink 3 hours prior to your test, except you may have water. . Do not consume products containing caffeine (regular or decaffeinated) 12 hours prior to your test. (ex: coffee, chocolate, sodas, tea). . Do bring a list of your current medications with you.  If not listed below, you may take your medications as normal. . Do wear comfortable clothes (no dresses or overalls) and walking shoes, tennis shoes preferred (No heels or open toe shoes are allowed). . Do NOT wear cologne, perfume, aftershave, or lotions (deodorant is allowed). . If these instructions are not  followed, your test will have to be rescheduled.  We will order CT coronary calcium score $150  Please call [phone] to schedule   [facility]  [address], [place] [address]   Follow-Up: At [facility], you and your health needs are our priority.  As part of our continuing mission to provide you with exceptional heart care, we have created designated Provider Care Teams.  These Care Teams include your primary Cardiologist (physician) and Advanced Practice Providers (APPs -  Physician Assistants and Nurse Practitioners) who all work together to provide you with the care you need, when you need it.  We recommend signing up for the patient portal called "MyChart".  Sign up information is provided on this After Visit Summary.  MyChart is used to connect with patients for Virtual Visits (Telemedicine).  Patients are able to view lab/test results, encounter notes, upcoming appointments, etc.  Non-urgent messages can be sent to your provider as well.   To learn more about what you can do with MyChart, go to [email or web address].    Your next appointment:   3 month(s)  The format for your next appointment:   In Person  Provider:   Jyl Heinz, MD   Other Instructions  Coronary Calcium Scan A coronary calcium scan is an imaging test used to look for deposits of plaque in the inner lining of the blood vessels of the heart (coronary arteries). Plaque is made up of calcium, protein, and fatty substances. These deposits of plaque can partly clog and narrow the coronary  arteries without producing any symptoms or warning signs. This puts a person at risk for a heart attack. This test is recommended for people who are at moderate risk for heart disease. The test can find plaque deposits before symptoms develop. Tell a health care provider about:  Any allergies you have.  All medicines you are taking, including vitamins, herbs, eye drops, creams, and  over-the-counter medicines.  Any problems you or family members have had with anesthetic medicines.  Any blood disorders you have.  Any surgeries you have had.  Any medical conditions you have.  Whether you are pregnant or may be pregnant. What are the risks? Generally, this is a safe procedure. However, problems may occur, including:  Harm to a pregnant woman and her unborn baby. This test involves the use of radiation. Radiation exposure can be dangerous to a pregnant woman and her unborn baby. If you are pregnant or think you may be pregnant, you should not have this procedure done.  Slight increase in the risk of cancer. This is because of the radiation involved in the test. What happens before the procedure? Ask your health care provider for any specific instructions on how to prepare for this procedure. You may be asked to avoid products that contain caffeine, tobacco, or nicotine for 4 hours before the procedure. What happens during the procedure?   You will undress and remove any jewelry from your neck or chest.  You will put on a hospital gown.  Sticky electrodes will be placed on your chest. The electrodes will be connected to an electrocardiogram (ECG) machine to record a tracing of the electrical activity of your heart.  You will lie down on a curved bed that is attached to the [place].  You may be given medicine to slow down your heart rate so that clear pictures can be created.  You will be moved into the CT scanner, and the CT scanner will take pictures of your heart. During this time, you will be asked to lie still and hold your breath for 2-3 seconds at a time while each picture of your heart is being taken. The procedure may vary among health care providers and hospitals. What happens after the procedure?  You can get dressed.  You can return to your normal activities.  It is up to you to get the results of your procedure. Ask your health care provider, or  the department that is doing the procedure, when your results will be ready. Summary  A coronary calcium scan is an imaging test used to look for deposits of plaque in the inner lining of the blood vessels of the heart (coronary arteries). Plaque is made up of calcium, protein, and fatty substances.  Generally, this is a safe procedure. Tell your health care provider if you are pregnant or may be pregnant.  Ask your health care provider for any specific instructions on how to prepare for this procedure.  A CT scanner will take pictures of your heart.  You can return to your normal activities after the scan is done. This information is not intended to replace advice given to you by your health care provider. Make sure you discuss any questions you have with your health care provider. Document Revised: 07/10/2018 Document Reviewed: 07/10/2018 Elsevier Patient Education  [place].  Nuclear Medicine Exam A nuclear medicine exam is a safe and painless imaging test. It helps your health care provider detect and diagnose diseases. It also provides information about  the ways your organs work and how they are structured. For a nuclear medicine exam, you will be given a radioactive tracer. This substance is absorbed by your body's organs. A large scanning machine detects the tracer and creates pictures of the areas that your health care provider wants to know more about. There are several kinds of nuclear medicine exams. They include the following:  CT scan.  MRI scan.  PET scan.  SPECT scan. Tell your health care provider about:  Any allergies you have.  All medicines you are taking, including vitamins, herbs, eye drops, creams, and over-the-counter medicines.  Any problems you or family members have had with anesthetic medicines.  Any blood disorders you have.  Any surgeries you have had.  Any medical conditions you have.  Whether you are pregnant or may be  pregnant.  Whether you are nursing. What are the risks? Generally, this is a safe procedure. However, problems may occur, such as an allergic reaction to the tracer, but this is rare. What happens before the procedure? Medicines Ask your health care provider about:  Changing or stopping your regular medicines. This is especially important if you are taking diabetes medicines or blood thinners.  Taking medicines such as aspirin and ibuprofen. These medicines can thin your blood. Do not take these medicines unless your health care provider tells you to take them.  Taking over-the-counter medicines, vitamins, herbs, and supplements. General instructions  Follow instructions from your health care provider about eating or drinking restrictions.  Do not wear jewelry.  Wear loose, comfortable clothing. You may be asked to wear a hospital gown for the procedure.  Bring previous imaging studies, such as X-rays, with you to the exam if they are available. What happens during the procedure?   An IV may be inserted into one of your veins.  You will be asked to lie on a table or sit in a chair.  You will be given the radioactive tracer. You may get: ? A pill or liquid to swallow. ? An injection. ? Medicine through your IV. ? A gas to inhale.  A large scanning machine will be used to create images of your body. After the pictures are taken, you may have to wait so your health care provider can make sure that enough images were taken. The procedure may vary among health care providers and hospitals. What happens after the procedure?  You may go home after the procedure and return to your usual activities, unless your health care provider tells you otherwise.  Drink enough water to keep your urine pale yellow. This helps to remove the radioactive tracer from your body.  It is up to you to get the results of your procedure. Ask your health care provider, or the department that is doing the  procedure, when your results will be ready.  Get help right away if you have problems breathing. Summary  A nuclear medicine exam is a safe and painless imaging test that provides information about how your organs are working. It is also used to detect and diagnose diseases of various body organs.  Follow your health care provider's instructions about eating and drinking restrictions. Ask whether you should change or stop any medicines.  During the procedure, you will be given a radioactive tracer. A large scanning machine will create images of your body.  You may go home after the procedure and return to your regular activities. Follow your health care provider's instructions.  Get help right away if  you have problems breathing. This information is not intended to replace advice given to you by your health care provider. Make sure you discuss any questions you have with your health care provider. Document Revised: 11/08/2017 Document Reviewed: 11/08/2017 Elsevier Patient Education  [place].  Echocardiogram An echocardiogram is a procedure that uses painless sound waves (ultrasound) to produce an image of the heart. Images from an echocardiogram can provide important information about:  Signs of coronary artery disease (CAD).  Aneurysm detection. An aneurysm is a weak or damaged part of an artery wall that bulges out from the normal force of blood pumping through the body.  Heart size and shape. Changes in the size or shape of the heart can be associated with certain conditions, including heart failure, aneurysm, and CAD.  Heart muscle function.  Heart valve function.  Signs of a past heart attack.  Fluid buildup around the heart.  Thickening of the heart muscle.  A tumor or infectious growth around the heart valves. Tell a health care provider about:  Any allergies you have.  All medicines you are taking, including vitamins, herbs, eye drops, creams, and  over-the-counter medicines.  Any blood disorders you have.  Any surgeries you have had.  Any medical conditions you have.  Whether you are pregnant or may be pregnant. What are the risks? Generally, this is a safe procedure. However, problems may occur, including:  Allergic reaction to dye (contrast) that may be used during the procedure. What happens before the procedure? No specific preparation is needed. You may eat and drink normally. What happens during the procedure?   An IV tube may be inserted into one of your veins.  You may receive contrast through this tube. A contrast is an injection that improves the quality of the pictures from your heart.  A gel will be applied to your chest.  A wand-like tool (transducer) will be moved over your chest. The gel will help to transmit the sound waves from the transducer.  The sound waves will harmlessly bounce off of your heart to allow the heart images to be captured in real-time motion. The images will be recorded on a computer. The procedure may vary among health care providers and hospitals. What happens after the procedure?  You may return to your normal, everyday life, including diet, activities, and medicines, unless your health care provider tells you not to do that. Summary  An echocardiogram is a procedure that uses painless sound waves (ultrasound) to produce an image of the heart.  Images from an echocardiogram can provide important information about the size and shape of your heart, heart muscle function, heart valve function, and fluid buildup around your heart.  You do not need to do anything to prepare before this procedure. You may eat and drink normally.  After the echocardiogram is completed, you may return to your normal, everyday life, unless your health care provider tells you not to do that. This information is not intended to replace advice given to you by your health care provider. Make sure you discuss  any questions you have with your health care provider. Document Revised: 04/12/2018 Document Reviewed: 01/23/2016 Elsevier Patient Education  [place].

## 2019-07-02 NOTE — Addendum Note (Signed)
Addended by: Truddie Hidden on: 07/02/2019 03:28 PM   Modules accepted: Orders

## 2019-07-04 DIAGNOSIS — M5136 Other intervertebral disc degeneration, lumbar region: Secondary | ICD-10-CM | POA: Diagnosis not present

## 2019-07-04 DIAGNOSIS — M9903 Segmental and somatic dysfunction of lumbar region: Secondary | ICD-10-CM | POA: Diagnosis not present

## 2019-07-30 ENCOUNTER — Other Ambulatory Visit: Payer: Self-pay

## 2019-07-30 ENCOUNTER — Ambulatory Visit (INDEPENDENT_AMBULATORY_CARE_PROVIDER_SITE_OTHER): Payer: Medicare Other

## 2019-07-30 DIAGNOSIS — R011 Cardiac murmur, unspecified: Secondary | ICD-10-CM

## 2019-07-30 DIAGNOSIS — M5136 Other intervertebral disc degeneration, lumbar region: Secondary | ICD-10-CM | POA: Diagnosis not present

## 2019-07-30 DIAGNOSIS — M9903 Segmental and somatic dysfunction of lumbar region: Secondary | ICD-10-CM | POA: Diagnosis not present

## 2019-07-30 LAB — ECHOCARDIOGRAM COMPLETE
Area-P 1/2: 3.27 cm2
P 1/2 time: 509 msec
S' Lateral: 3 cm

## 2019-07-30 NOTE — Progress Notes (Signed)
Complete echocardiogram performed.  Jimmy Marlicia Sroka RDCS, RVT  

## 2019-07-31 ENCOUNTER — Telehealth: Payer: Self-pay

## 2019-07-31 NOTE — Telephone Encounter (Signed)
Spoke with patient regarding results and recommendation.  Patient verbalizes understanding and is agreeable to plan of care. Advised patient to call back with any issues or concerns.  

## 2019-07-31 NOTE — Telephone Encounter (Signed)
-----   Message from Jenean Lindau, MD sent at 07/31/2019  9:54 AM EDT ----- The results of the study is unremarkable. Please inform patient. I will discuss in detail at next appointment. Cc  primary care/referring physician Jenean Lindau, MD 07/31/2019 9:54 AM

## 2019-08-07 ENCOUNTER — Telehealth (HOSPITAL_COMMUNITY): Payer: Self-pay | Admitting: Cardiology

## 2019-08-07 DIAGNOSIS — R072 Precordial pain: Secondary | ICD-10-CM

## 2019-08-07 DIAGNOSIS — R0789 Other chest pain: Secondary | ICD-10-CM

## 2019-08-07 NOTE — Telephone Encounter (Signed)
I called patient to reschedule the Myoview that Dr. Geraldo Pitter wanted her to have and she declined to reschedule and wished NOT to have at this time. She did state she would have the Cardiac CT though.  Order will be removed from the Nuclear WQ. If patient decides at a later date to have we can reinstate the Order. Thank you

## 2019-08-08 NOTE — Telephone Encounter (Signed)
She has a calcium score CT ordered. Do you want a CT FFR? The FFR will not pass. How do you advise?

## 2019-08-08 NOTE — Telephone Encounter (Signed)
You can try to schedule him for cardiac CT FFR and see if insurance will approve it.

## 2019-08-08 NOTE — Telephone Encounter (Signed)
I placed the order for the Cardiac CT without the FFR. The FFR is not covered by Medicare. Pt will determine if she is willing to have the test once she has the cost. Pt states that she has not had any chest discomfort recently as she thinks that her hiatal hernia was the problem

## 2019-08-08 NOTE — Telephone Encounter (Signed)
Put a hold on calcium score for now.  Set her up for CT FFR if it does not go through the patient's insurance then we will let the patient know.

## 2019-08-08 NOTE — Addendum Note (Signed)
Addended by: Truddie Hidden on: 08/08/2019 03:00 PM   Modules accepted: Orders

## 2019-08-12 DIAGNOSIS — M9903 Segmental and somatic dysfunction of lumbar region: Secondary | ICD-10-CM | POA: Diagnosis not present

## 2019-08-12 DIAGNOSIS — M5136 Other intervertebral disc degeneration, lumbar region: Secondary | ICD-10-CM | POA: Diagnosis not present

## 2019-08-21 ENCOUNTER — Telehealth: Payer: Self-pay | Admitting: Cardiology

## 2019-08-21 NOTE — Telephone Encounter (Signed)
Called and spoke with pt regarding her being upset that I had not called her back to discuss the cost. I talked with her in depth that we send the order to the pre-cert team who gets with the insurance. I explained to her that once is has been pre certed that someone would be able to assist her with the amount she will be responsible for. Pt is also currently sick with head and chest congestion, I cancelled her upcoming CT calcium score and she will call to reschedule once she is better. Pt verbalized understanding and had no additional questions or concerns.

## 2019-08-21 NOTE — Telephone Encounter (Signed)
Message left for pt to call back.

## 2019-08-21 NOTE — Telephone Encounter (Signed)
Patient is confused about what tests Dr. Geraldo Pitter wants her to have and not have and the proper order of what testing needs to be done.  Please discuss testing with patient

## 2019-08-21 NOTE — Telephone Encounter (Signed)
Spoke to the patient just now. She let me know that she had canceled her nuclear stress test and is wondering if she can change the order over to doing the exercise treadmill stress test instead. She also states that she spoke to [place], [place] on 08/08/19 in regards to her cardiac CT and that Reuben Likes was going to find out what the cost of this CT was going to be for her and that she would reach back out to the patient with this information. Sharon Marsh is upset because she never heard anything back. I did advise that she could call the insurance company for herself and ask them how much the CT would cost her since the FFR was not covered by her medicare. She verbalizes understanding and I let her know that Dr. Geraldo Pitter was out of the office until Monday. However, she requests that another Dr. Primitivo Gauze at her message and make a decision in regard to the stress test before then.

## 2019-08-23 ENCOUNTER — Other Ambulatory Visit: Payer: Medicare Other

## 2019-08-28 DIAGNOSIS — M9903 Segmental and somatic dysfunction of lumbar region: Secondary | ICD-10-CM | POA: Diagnosis not present

## 2019-08-28 DIAGNOSIS — M5136 Other intervertebral disc degeneration, lumbar region: Secondary | ICD-10-CM | POA: Diagnosis not present

## 2019-09-05 DIAGNOSIS — M9903 Segmental and somatic dysfunction of lumbar region: Secondary | ICD-10-CM | POA: Diagnosis not present

## 2019-09-05 DIAGNOSIS — M5136 Other intervertebral disc degeneration, lumbar region: Secondary | ICD-10-CM | POA: Diagnosis not present

## 2019-09-17 ENCOUNTER — Inpatient Hospital Stay: Admission: RE | Admit: 2019-09-17 | Payer: Medicare Other | Source: Ambulatory Visit

## 2019-09-19 DIAGNOSIS — M9903 Segmental and somatic dysfunction of lumbar region: Secondary | ICD-10-CM | POA: Diagnosis not present

## 2019-09-19 DIAGNOSIS — M5136 Other intervertebral disc degeneration, lumbar region: Secondary | ICD-10-CM | POA: Diagnosis not present

## 2019-10-01 ENCOUNTER — Ambulatory Visit (INDEPENDENT_AMBULATORY_CARE_PROVIDER_SITE_OTHER)
Admission: RE | Admit: 2019-10-01 | Discharge: 2019-10-01 | Disposition: A | Discharge status: Home / Self Care | Payer: Self-pay | Source: Ambulatory Visit | Attending: Cardiology | Admitting: Cardiology

## 2019-10-01 ENCOUNTER — Other Ambulatory Visit: Payer: Self-pay

## 2019-10-01 DIAGNOSIS — M9903 Segmental and somatic dysfunction of lumbar region: Secondary | ICD-10-CM | POA: Diagnosis not present

## 2019-10-01 DIAGNOSIS — I119 Hypertensive heart disease without heart failure: Secondary | ICD-10-CM

## 2019-10-01 DIAGNOSIS — R0789 Other chest pain: Secondary | ICD-10-CM

## 2019-10-01 DIAGNOSIS — M5136 Other intervertebral disc degeneration, lumbar region: Secondary | ICD-10-CM | POA: Diagnosis not present

## 2019-10-07 ENCOUNTER — Other Ambulatory Visit: Payer: Self-pay

## 2019-10-08 ENCOUNTER — Telehealth: Payer: Self-pay | Admitting: Cardiology

## 2019-10-08 ENCOUNTER — Other Ambulatory Visit: Payer: Self-pay

## 2019-10-08 ENCOUNTER — Ambulatory Visit (INDEPENDENT_AMBULATORY_CARE_PROVIDER_SITE_OTHER): Payer: Medicare Other | Admitting: Cardiology

## 2019-10-08 ENCOUNTER — Encounter: Payer: Self-pay | Admitting: Cardiology

## 2019-10-08 DIAGNOSIS — E66811 Obesity, class 1: Secondary | ICD-10-CM | POA: Insufficient documentation

## 2019-10-08 DIAGNOSIS — R931 Abnormal findings on diagnostic imaging of heart and coronary circulation: Secondary | ICD-10-CM | POA: Diagnosis not present

## 2019-10-08 DIAGNOSIS — E782 Mixed hyperlipidemia: Secondary | ICD-10-CM | POA: Diagnosis not present

## 2019-10-08 DIAGNOSIS — E669 Obesity, unspecified: Secondary | ICD-10-CM

## 2019-10-08 DIAGNOSIS — M9903 Segmental and somatic dysfunction of lumbar region: Secondary | ICD-10-CM | POA: Diagnosis not present

## 2019-10-08 DIAGNOSIS — I10 Essential (primary) hypertension: Secondary | ICD-10-CM | POA: Diagnosis not present

## 2019-10-08 DIAGNOSIS — M5136 Other intervertebral disc degeneration, lumbar region: Secondary | ICD-10-CM | POA: Diagnosis not present

## 2019-10-08 DIAGNOSIS — R9430 Abnormal result of cardiovascular function study, unspecified: Secondary | ICD-10-CM

## 2019-10-08 MED ORDER — HM COQ-10 200 MG PO CAPS: 200.0000 mg | ORAL_CAPSULE | Freq: Every day | ORAL | 3 refills | Status: DC

## 2019-10-08 MED ORDER — PITAVASTATIN CALCIUM 1 MG PO TABS: 1.0000 mg | ORAL_TABLET | Freq: Every day | ORAL | 6 refills | Status: DC

## 2019-10-08 MED ORDER — ASPIRIN EC 81 MG PO TBEC: 81.0000 mg | DELAYED_RELEASE_TABLET | Freq: Every day | ORAL | 3 refills | Status: DC

## 2019-10-08 NOTE — Progress Notes (Signed)
Cardiology Office Note:    Date:  10/08/2019   ID:  EVERLEAN BUCHER, DOB 02/06/1945, MRN [id number]  PCP:  Lowella Dandy, NP  Cardiologist:  Jenean Lindau, MD   Referring MD: Lowella Dandy, NP    ASSESSMENT:    1. Elevated coronary artery calcium score   2. Essential hypertension   3. Mixed dyslipidemia   4. Obesity (BMI 30.0-34.9)    PLAN:    In order of problems listed above:  1. Elevated coronary artery calcium score: Secondary prevention stressed with the patient.  Importance of compliance with diet medication stressed and she vocalized understanding. 2. Chest discomfort: Appears atypical however in view of risk factors and above finding we will do a Lexiscan sestamibi. 3. Essential hypertension: Blood pressure is stable and diet was emphasized 4. Mixed dyslipidemia: Lipids from the month of June from primary care office was reviewed.  There are markedly elevated.  I will do a Chem-7 and liver function today.  I will initiate her on Livalo 1 mg daily.  She will take co-Q10 also.  She will let us know if there are any issues with it.  Benefits and potential is explained and she vocalized understanding.  She will be seen follow-up appointment in 2 months or earlier if she has any concerns.  She will be back in 6 weeks for liver lipid check. 5. Obesity: Weight reduction was stressed.  Risks of obesity explained and she vocalized understanding and plans to comply. 6. Sleep apnea: Sleep health issues were discussed.   Medication Adjustments/Labs and Tests Ordered: Current medicines are reviewed at length with the patient today.  Concerns regarding medicines are outlined above.  No orders of the defined types were placed in this encounter.  No orders of the defined types were placed in this encounter.    No chief complaint on file.    History of Present Illness:    Sharon Marsh is a 74 y.o. female.  Patient has past medical history of essential hypertension and  dyslipidemia.  She is overweight she does not exercise she leads a sedentary lifestyle.  She is active and cleans homes.  She occasionally complains of chest discomfort.  She is here for follow-up.  Her calcium score significantly elevated.  At the time of my evaluation, the patient is alert awake oriented and in no distress.  She mentions to me that her chest discomfort is better with Prilosec.  Past Medical History:  Diagnosis Date  . Anxiety state, unspecified 08/15/2013  . Bulging lumbar disc   . Cardiac murmur 07/02/2019  . Chest discomfort 07/02/2019  . GERD (gastroesophageal reflux disease)   . Hiatal hernia   . Hyperlipidemia 08/15/2013  . Hypertension   . Insomnia   . Lumbar spondylolysis   . 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 Obstructive sleep apnea   . Osteoarthritis of left knee 08/09/2017  . Osteoarthritis of right knee 10/03/2018  . Osteoporosis 02/12/2019  . Other and unspecified hyperlipidemia   . Personal history of vulvar dysplasia   . PONV (postoperative nausea and vomiting)   . Unspecified essential hypertension 08/15/2013  . Unspecified hereditary and idiopathic peripheral neuropathy 08/15/2013    Past Surgical History:  Procedure Laterality Date  . ABDOMINAL HYSTERECTOMY    . APPENDECTOMY  1950  . CATARACT EXTRACTION Bilateral 2008  . KNEE SURGERY Left 2018   Arthroscopic knee surg for torn meniscus  . THYROID SURGERY  1980  . VULVECTOMY N/A 06/22/2012  Procedure: WIDE EXCISION VULVECTOMY;  Surgeon: Allyn Kenner, DO;  Location: [facility];  Service: Gynecology;  Laterality: N/A;  wide local excision of vulva  . 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 Acetylcysteine (NAC) 600 MG CAPS Take by mouth.  . Ascorbic Acid ([employer] VITAMIN C) 1000 MG CHEW Chew 1,000 mg by mouth daily.  Marland Kitchen b complex vitamins tablet Take 1 tablet by mouth daily.  . cholecalciferol (VITAMIN D3) 25 MCG (1000 UNIT) tablet  Take 1,000 Units by mouth daily.  . fluticasone (FLONASE) 50 MCG/ACT nasal spray Place 2 sprays into both nostrils daily.  Marland Kitchen ibuprofen (ADVIL) 800 MG tablet Take 800 mg by mouth 3 (three) times daily.  . metoprolol (LOPRESSOR) 50 MG tablet Take 50 mg by mouth 2 (two) times daily. Take 1/2 tablet two times daily  . omeprazole (PRILOSEC) 20 MG capsule Take 20 mg by mouth daily.  . traMADol (ULTRAM) 50 MG tablet Take 50 mg by mouth every 4 (four) hours as needed.  . zolpidem (AMBIEN) 10 MG tablet Take 10 mg by mouth at bedtime as needed for sleep.     Allergies:   Clindamycin, Clindamycin/lincomycin, and Latex   Social History   Socioeconomic History  . Marital status: Widowed    Spouse name: Not on file  . Number of children: 2  . Years of education: college  . Highest education level: Not on file  Occupational History  . Not on file  Tobacco Use  . Smoking status: Former Research scientist (life sciences)  . Smokeless tobacco: Never Used  Substance and Sexual Activity  . Alcohol use: Yes    Comment: socially  . Drug use: No  . Sexual activity: Yes    Birth control/protection: None  Other Topics Concern  . Not on file  Social History Narrative   Patient is widowed with 2 children.   Patient is right handed.   Patient has some college.   Patient drinks 1 cup daily.   Social Determinants of Health   Financial Resource Strain:   . Difficulty of Paying Living Expenses: Not on file  Food Insecurity:   . Worried About Charity fundraiser in the Last Year: Not on file  . Ran Out of Food in the Last Year: Not on file  Transportation Needs:   . Lack of Transportation (Medical): Not on file  . Lack of Transportation (Non-Medical): Not on file  Physical Activity:   . Days of Exercise per Week: Not on file  . Minutes of Exercise per Session: Not on file  Stress:   . Feeling of Stress : Not on file  Social Connections:   . Frequency of Communication with Friends and Family: Not on file  . Frequency of  Social Gatherings with Friends and Family: Not on file  . Attends Religious Services: Not on file  . Active Member of Clubs or Organizations: Not on file  . Attends Archivist Meetings: Not on file  . Marital Status: Not on file     Family History: The patient's family history includes Heart attack in her father; Heart disease in her mother; Hypercholesterolemia in her father; Hypertension in her father and mother. There is no history of Migraines.  ROS:   Please see the history of present illness.    All other systems reviewed and are negative.  EKGs/Labs/Other Studies Reviewed:    The following studies were reviewed  today: 10/01/2019 18:05  CLINICAL DATA:  Risk stratification  EXAM: Coronary Calcium Score  TECHNIQUE: The patient was scanned on a [employer]. Axial non-contrast 3 mm slices were carried out through the heart. The data set was analyzed on a dedicated work station and scored using the Sebastian.  FINDINGS: Non-cardiac: See separate report from [facility].  Ascending Aorta: Normal  Pericardium: Normal  Coronary arteries: Normal origin  IMPRESSION: Coronary calcium score of 226 . This was 18 percentile for age and sex matched control.  Kardie Tobb,DO   Electronically Signed   By: Berniece Salines DO   On: 10/01/2019 18:05   Signed by Berniece Salines, DO on 10/01/2019 6:07 PM  Narrative & Impression  EXAM: OVER-READ INTERPRETATION  CT CHEST  The following report is an over-read performed by radiologist Dr. Aletta Edouard of [facility], [place] on 10/01/2019. This over-read does not include interpretation of cardiac or coronary anatomy or pathology. The coronary calcium score interpretation by the cardiologist is attached.  COMPARISON:  None.  FINDINGS: Vascular: No significant vascular findings. Normal heart size. No pericardial effusion.  Mediastinum/Nodes: No visualized enlarged  mediastinal or axillary lymph nodes.  Lungs/Pleura: Visualized lungs show no evidence of pulmonary edema, consolidation, pneumothorax, nodule or pleural fluid.  Upper Abdomen: No acute abnormality.  Musculoskeletal: No chest wall mass or suspicious bone lesions identified.  IMPRESSION: No significant incidental findings.  Electronically Signed: By: Aletta Edouard M.D. On: 10/01/2019 13:46       Recent Labs: No results found for requested labs within last 8760 hours.  Recent Lipid Panel No results found for: CHOL, TRIG, HDL, CHOLHDL, VLDL, LDLCALC, LDLDIRECT  Physical Exam:    VS:  BP 118/73   Pulse 66   Ht 5\' 3"  (1.6 m)   Wt 176 lb 3.2 oz (79.9 kg)   SpO2 96%   BMI 31.21 kg/m     Wt Readings from Last 3 Encounters:  10/08/19 176 lb 3.2 oz (79.9 kg)  07/02/19 181 lb (82.1 kg)  06/21/19 177 lb (80.3 kg)     GEN: Patient is in no acute distress HEENT: Normal NECK: No JVD; No carotid bruits LYMPHATICS: No lymphadenopathy CARDIAC: Hear sounds regular, 2/6 systolic murmur at the apex. RESPIRATORY:  Clear to auscultation without rales, wheezing or rhonchi  ABDOMEN: Soft, non-tender, non-distended MUSCULOSKELETAL:  No edema; No deformity  SKIN: Warm and dry NEUROLOGIC:  Alert and oriented x 3 PSYCHIATRIC:  Normal affect   Signed, Jenean Lindau, MD  10/08/2019 11:51 AM    [place]

## 2019-10-08 NOTE — Patient Instructions (Signed)
Medication Instructions:  Your physician has recommended you make the following change in your medication:   Start Livalo 1 mg daily. Take CoQ 10 200 mg daily. Take 81 mg coated aspirin daily.  *If you need a refill on your cardiac medications before your next appointment, please call your pharmacy*   Lab Work: Your physician recommends that you have a BMET, TSH and LFT today in the office.  Your physician recommends that you return for lab work in: 6 weeks (11/18/2019). You need to have labs done when you are fasting.  You can come Monday through Friday 8:30 am to 12:00 pm and 1:15 to 4:30. You do not need to make an appointment as the order has already been placed. The labs you are going to have done are BMET, LFT and Lipids.   If you have labs (blood work) drawn today and your tests are completely normal, you will receive your results only by: Marland Kitchen MyChart Message (if you have MyChart) OR . A paper copy in the mail If you have any lab test that is abnormal or we need to change your treatment, we will call you to review the results.   Testing/Procedures: Your physician has requested that you have a lexiscan myoview. For further information please visit [email or web address]. Please follow instruction sheet, as given.  The test will take approximately 3 to 4 hours to complete; you may bring reading material.  If someone comes with you to your appointment, they will need to remain in the main lobby due to limited space in the testing area.  How to prepare for your Myocardial Perfusion Test: . Do not eat or drink 3 hours prior to your test, except you may have water. . Do not consume products containing caffeine (regular or decaffeinated) 12 hours prior to your test. (ex: coffee, chocolate, sodas, tea). . Do bring a list of your current medications with you.  If not listed below, you may take your medications as normal. . Do wear comfortable clothes (no dresses or overalls) and walking  shoes, tennis shoes preferred (No heels or open toe shoes are allowed). . Do NOT wear cologne, perfume, aftershave, or lotions (deodorant is allowed). . If these instructions are not followed, your test will have to be rescheduled.    Follow-Up: At [facility], you and your health needs are our priority.  As part of our continuing mission to provide you with exceptional heart care, we have created designated Provider Care Teams.  These Care Teams include your primary Cardiologist (physician) and Advanced Practice Providers (APPs -  Physician Assistants and Nurse Practitioners) who all work together to provide you with the care you need, when you need it.  We recommend signing up for the patient portal called "MyChart".  Sign up information is provided on this After Visit Summary.  MyChart is used to connect with patients for Virtual Visits (Telemedicine).  Patients are able to view lab/test results, encounter notes, upcoming appointments, etc.  Non-urgent messages can be sent to your provider as well.   To learn more about what you can do with MyChart, go to [email or web address].    Your next appointment:   3 month(s)  The format for your next appointment:   In Person  Provider:   Jyl Heinz, MD   Other Instructions Pitavastatin oral tablets What is this medicine? PITAVASTATIN (pit A va STAT in) is known as a HMG-CoA reductase inhibitor or 'statin'. It lowers the level of cholesterol and triglycerides in the blood.  Diet and lifestyle changes are often used with this drug. This medicine may be used for other purposes; ask your health care provider or pharmacist if you have questions. COMMON BRAND NAME(S): Livalo, Zypitamag What should I tell my health care provider before I take this medicine? They need to know if you have any of these conditions:  diabetes  if you often drink alcohol  history of stroke  kidney disease  liver disease  muscle aches or  weakness  thyroid disease  an unusual or allergic reaction to pitavastatin, other medicines, foods, dyes, or preservatives  pregnant or trying to get pregnant  breast-feeding How should I use this medicine? Take this medicine by mouth with a glass of water. Follow the directions on the prescription label. You can take it with or without food. If it upsets your stomach, take it with food. Take your medicine at regular intervals. Do not take it more often than directed. Do not stop taking except on your doctor's advice. Talk to your pediatrician regarding the use of this medicine in children. While this drug may be prescribed for children as young as 8 years for selected conditions, precautions do apply. Overdosage: If you think you have taken too much of this medicine contact a poison control center or emergency room at once. NOTE: This medicine is only for you. Do not share this medicine with others. What if I miss a dose? If you miss a dose, take it as soon as you can. If it is almost time for your next dose, take only that dose. Do not take double or extra doses. What may interact with this medicine? Do not take this medicine with any of the following medications:  cyclosporine  gemfibrozil  herbal medicines like red yeast rice This medicine may also interact with the following medications:  alcohol  antiviral medicines for HIV or AIDS  erythromycin  other medicines for cholesterol  rifampin  warfarin This list may not describe all possible interactions. Give your health care provider a list of all the medicines, herbs, non-prescription drugs, or dietary supplements you use. Also tell them if you smoke, drink alcohol, or use illegal drugs. Some items may interact with your medicine. What should I watch for while using this medicine? Visit your doctor or health care professional for regular check-ups. You may need regular tests to make sure your liver is working  properly. Your health care professional may tell you to stop taking this medicine if you develop muscle problems. If your muscle problems do not go away after stopping this medicine, contact your health care professional. Do not become pregnant while taking this medicine. Women should inform their health care professional if they wish to become pregnant or think they might be pregnant. There is a potential for serious side effects to an unborn child. Talk to your health care professional or pharmacist for more information. Do not breast-feed an infant while taking this medicine. This medicine may increase blood sugar. Ask your healthcare provider if changes in diet or medicines are needed if you have diabetes. If you are going to need surgery or other procedure, tell your doctor that you are using this medicine. This drug is only part of a total heart-health program. Your doctor or a dietician can suggest a low-cholesterol and low-fat diet to help. Avoid alcohol and smoking, and keep a proper exercise schedule. This medicine may cause a decrease in Co-Enzyme Q-10. You should make sure that you get enough Co-Enzyme Q-10 while  you are taking this medicine. Discuss the foods you eat and the vitamins you take with your health care professional. What side effects may I notice from receiving this medicine? Side effects that you should report to your doctor or health care professional as soon as possible:  allergic reactions like skin rash, itching or hives, swelling of the face, lips, or tongue  confusion  joint pain  loss of memory  redness, blistering, peeling or loosening of the skin, including inside the mouth  signs and symptoms of high blood sugar such as being more thirsty or hungry or having to urinate more than normal. You may also feel very tired or have blurry vision.  signs and symptoms of muscle injury like dark urine; trouble passing urine or change in the amount of urine; unusually  weak or tired; muscle pain or side or back pain  yellowing of the eyes or skin Side effects that usually do not require medical attention (report to your doctor or health care professional if they continue or are bothersome):  constipation  diarrhea  dizziness  gas  headache  nausea  stomach pain  trouble sleeping  upset stomach This list may not describe all possible side effects. Call your doctor for medical advice about side effects. You may report side effects to FDA at 1-800-FDA-1088. Where should I keep my medicine? Keep out of the reach of children. Store at room temperature between 15 and 30 degrees C (59 and 86 degrees F). Protect from light. Throw away any unused medicine after the expiration date. NOTE: This sheet is a summary. It may not cover all possible information. If you have questions about this medicine, talk to your doctor, pharmacist, or health care provider.  2020 Elsevier/Gold Standard (2017-10-12 08:15:37)  Cardiac Nuclear Scan A cardiac nuclear scan is a test that is done to check the flow of blood to your heart. It is done when you are resting and when you are exercising. The test looks for problems such as:  Not enough blood reaching a portion of the heart.  The heart muscle not working as it should. You may need this test if:  You have heart disease.  You have had lab results that are not normal.  You have had heart surgery or a balloon procedure to open up blocked arteries (angioplasty).  You have chest pain.  You have shortness of breath. In this test, a special dye (tracer) is put into your bloodstream. The tracer will travel to your heart. A camera will then take pictures of your heart to see how the tracer moves through your heart. This test is usually done at a hospital and takes 2-4 hours. Tell a doctor about:  Any allergies you have.  All medicines you are taking, including vitamins, herbs, eye drops, creams, and over-the-counter  medicines.  Any problems you or family members have had with anesthetic medicines.  Any blood disorders you have.  Any surgeries you have had.  Any medical conditions you have.  Whether you are pregnant or may be pregnant. What are the risks? Generally, this is a safe test. However, problems may occur, such as:  Serious chest pain and heart attack. This is only a risk if the stress portion of the test is done.  Rapid heartbeat.  A feeling of warmth in your chest. This feeling usually does not last long.  Allergic reaction to the tracer. What happens before the test?  Ask your doctor about changing or stopping your normal  medicines. This is important.  Follow instructions from your doctor about what you cannot eat or drink.  Remove your jewelry on the day of the test. What happens during the test?  An IV tube will be inserted into one of your veins.  Your doctor will give you a small amount of tracer through the IV tube.  You will wait for 20-40 minutes while the tracer moves through your bloodstream.  Your heart will be monitored with an electrocardiogram (ECG).  You will lie down on an exam table.  Pictures of your heart will be taken for about 15-20 minutes.  You may also have a stress test. For this test, one of these things may be done: ? You will be asked to exercise on a treadmill or a stationary bike. ? You will be given medicines that will make your heart work harder. This is done if you are unable to exercise.  When blood flow to your heart has peaked, a tracer will again be given through the IV tube.  After 20-40 minutes, you will get back on the exam table. More pictures will be taken of your heart.  Depending on the tracer that is used, more pictures may need to be taken 3-4 hours later.  Your IV tube will be removed when the test is over. The test may vary among doctors and hospitals. What happens after the test?  Ask your doctor: ? Whether you  can return to your normal schedule, including diet, activities, and medicines. ? Whether you should drink more fluids. This will help to remove the tracer from your body. Drink enough fluid to keep your pee (urine) pale yellow.  Ask your doctor, or the department that is doing the test: ? When will my results be ready? ? How will I get my results? Summary  A cardiac nuclear scan is a test that is done to check the flow of blood to your heart.  Tell your doctor whether you are pregnant or may be pregnant.  Before the test, ask your doctor about changing or stopping your normal medicines. This is important.  Ask your doctor whether you can return to your normal activities. You may be asked to drink more fluids. This information is not intended to replace advice given to you by your health care provider. Make sure you discuss any questions you have with your health care provider. Document Revised: 04/11/2018 Document Reviewed: 06/05/2017 Elsevier Patient Education  [place].

## 2019-10-08 NOTE — Telephone Encounter (Signed)
Pt c/o medication issue:  1. Name of Medication: Labetalol  2. How are you currently taking this medication (dosage and times per day)? N/A  3. Are you having a reaction (difficulty breathing--STAT)? No  4. What is your medication issue? Patient states the medication came up to $250 and she is unable to afford it. She requested a call back to discuss alternatives.

## 2019-10-08 NOTE — Telephone Encounter (Signed)
Called patient. She reports pitivastatin (livalo) is too expensive for her. I informed her the prior authorization most likely has not been done yet and would explain the high price. I asked her to wait a couple days and check back with pharmacy after prior authorization and see if that is then a price she can afford. Will forward to Dr. Julien Nordmann nurse.

## 2019-10-09 LAB — BASIC METABOLIC PANEL
BUN/Creatinine Ratio: 27 (ref 12–28)
BUN: 26 mg/dL (ref 8–27)
CO2: 23 mmol/L (ref 20–29)
Calcium: 9.3 mg/dL (ref 8.7–10.3)
Chloride: 102 mmol/L (ref 96–106)
Creatinine, Ser: 0.95 mg/dL (ref 0.57–1.00)
GFR calc Af Amer: 68 mL/min/{1.73_m2} (ref >59)
GFR calc non Af Amer: 59 mL/min/{1.73_m2} — ABNORMAL LOW (ref >59)
Glucose: 96 mg/dL (ref 65–99)
Potassium: 4.9 mmol/L (ref 3.5–5.2)
Sodium: 141 mmol/L (ref 134–144)

## 2019-10-09 LAB — HEPATIC FUNCTION PANEL
ALT: 20 IU/L (ref 0–32)
AST: 20 IU/L (ref 0–40)
Albumin: 4.3 g/dL (ref 3.7–4.7)
Alkaline Phosphatase: 75 IU/L (ref 44–121)
Bilirubin Total: 0.3 mg/dL (ref 0.0–1.2)
Bilirubin, Direct: <0.1 mg/dL (ref 0.00–0.40)
Total Protein: 6.4 g/dL (ref 6.0–8.5)

## 2019-10-09 LAB — TSH: TSH: 3.3 u[IU]/mL (ref 0.450–4.500)

## 2019-10-10 ENCOUNTER — Telehealth: Payer: Self-pay

## 2019-10-10 DIAGNOSIS — M5136 Other intervertebral disc degeneration, lumbar region: Secondary | ICD-10-CM | POA: Diagnosis not present

## 2019-10-10 DIAGNOSIS — M9903 Segmental and somatic dysfunction of lumbar region: Secondary | ICD-10-CM | POA: Diagnosis not present

## 2019-10-10 NOTE — Telephone Encounter (Signed)
-----   Message from Jenean Lindau, MD sent at 10/09/2019  8:13 AM EDT ----- The results of the study is unremarkable. Please inform patient. I will discuss in detail at next appointment. Cc  primary care/referring physician Jenean Lindau, MD 10/09/2019 8:13 AM

## 2019-10-10 NOTE — Telephone Encounter (Signed)
Left message on patients voicemail to please return our call.   

## 2019-10-11 NOTE — Telephone Encounter (Signed)
Left VM for patient to callback to verify if she has RX insurance coverage.

## 2019-10-15 NOTE — Telephone Encounter (Signed)
Patient said she was returning a call regarding RX coverage but she says the RX is too expensive and she does not wish to take it or schedule any future appointments.

## 2019-10-15 NOTE — Telephone Encounter (Signed)
FYI

## 2019-10-17 DIAGNOSIS — M9903 Segmental and somatic dysfunction of lumbar region: Secondary | ICD-10-CM | POA: Diagnosis not present

## 2019-10-17 DIAGNOSIS — M5136 Other intervertebral disc degeneration, lumbar region: Secondary | ICD-10-CM | POA: Diagnosis not present

## 2019-10-21 DIAGNOSIS — M9903 Segmental and somatic dysfunction of lumbar region: Secondary | ICD-10-CM | POA: Diagnosis not present

## 2019-10-21 DIAGNOSIS — M5136 Other intervertebral disc degeneration, lumbar region: Secondary | ICD-10-CM | POA: Diagnosis not present

## 2019-10-27 DIAGNOSIS — I10 Essential (primary) hypertension: Secondary | ICD-10-CM | POA: Diagnosis present

## 2019-10-27 DIAGNOSIS — R531 Weakness: Secondary | ICD-10-CM | POA: Diagnosis not present

## 2019-10-27 DIAGNOSIS — E669 Obesity, unspecified: Secondary | ICD-10-CM | POA: Diagnosis present

## 2019-10-27 DIAGNOSIS — M1990 Unspecified osteoarthritis, unspecified site: Secondary | ICD-10-CM | POA: Diagnosis present

## 2019-10-27 DIAGNOSIS — Z79899 Other long term (current) drug therapy: Secondary | ICD-10-CM | POA: Diagnosis not present

## 2019-10-27 DIAGNOSIS — R200 Anesthesia of skin: Secondary | ICD-10-CM | POA: Diagnosis present

## 2019-10-27 DIAGNOSIS — G459 Transient cerebral ischemic attack, unspecified: Secondary | ICD-10-CM | POA: Diagnosis present

## 2019-10-27 DIAGNOSIS — Z6830 Body mass index (BMI) 30.0-30.9, adult: Secondary | ICD-10-CM | POA: Diagnosis not present

## 2019-10-27 DIAGNOSIS — E785 Hyperlipidemia, unspecified: Secondary | ICD-10-CM | POA: Diagnosis present

## 2019-10-27 DIAGNOSIS — F419 Anxiety disorder, unspecified: Secondary | ICD-10-CM | POA: Diagnosis present

## 2019-10-29 ENCOUNTER — Emergency Department (HOSPITAL_COMMUNITY)
Admission: EM | Admit: 2019-10-29 | Discharge: 2019-10-30 | Disposition: A | Discharge status: Home / Self Care | Payer: Medicare Other | Attending: Emergency Medicine | Admitting: Emergency Medicine

## 2019-10-29 ENCOUNTER — Encounter (HOSPITAL_COMMUNITY): Payer: Self-pay | Admitting: Emergency Medicine

## 2019-10-29 ENCOUNTER — Emergency Department (HOSPITAL_COMMUNITY): Payer: Medicare Other

## 2019-10-29 ENCOUNTER — Other Ambulatory Visit: Payer: Self-pay

## 2019-10-29 DIAGNOSIS — I10 Essential (primary) hypertension: Secondary | ICD-10-CM | POA: Insufficient documentation

## 2019-10-29 DIAGNOSIS — R202 Paresthesia of skin: Secondary | ICD-10-CM | POA: Insufficient documentation

## 2019-10-29 DIAGNOSIS — Z79899 Other long term (current) drug therapy: Secondary | ICD-10-CM | POA: Diagnosis not present

## 2019-10-29 DIAGNOSIS — Z91040 Latex allergy status: Secondary | ICD-10-CM | POA: Diagnosis not present

## 2019-10-29 DIAGNOSIS — Z7982 Long term (current) use of aspirin: Secondary | ICD-10-CM | POA: Insufficient documentation

## 2019-10-29 DIAGNOSIS — Z87891 Personal history of nicotine dependence: Secondary | ICD-10-CM | POA: Insufficient documentation

## 2019-10-29 LAB — DIFFERENTIAL
Abs Immature Granulocytes: 0.01 10*3/uL (ref 0.00–0.07)
Basophils Absolute: 0.1 10*3/uL (ref 0.0–0.1)
Basophils Relative: 1 %
Eosinophils Absolute: 0.1 10*3/uL (ref 0.0–0.5)
Eosinophils Relative: 1 %
Immature Granulocytes: 0 %
Lymphocytes Relative: 28 %
Lymphs Abs: 1.9 10*3/uL (ref 0.7–4.0)
Monocytes Absolute: 0.5 10*3/uL (ref 0.1–1.0)
Monocytes Relative: 7 %
Neutro Abs: 4.3 10*3/uL (ref 1.7–7.7)
Neutrophils Relative %: 63 %

## 2019-10-29 LAB — COMPREHENSIVE METABOLIC PANEL
ALT: 19 U/L (ref 0–44)
AST: 18 U/L (ref 15–41)
Albumin: 3.9 g/dL (ref 3.5–5.0)
Alkaline Phosphatase: 68 U/L (ref 38–126)
Anion gap: 10 (ref 5–15)
BUN: 23 mg/dL (ref 8–23)
CO2: 23 mmol/L (ref 22–32)
Calcium: 8.8 mg/dL — ABNORMAL LOW (ref 8.9–10.3)
Chloride: 104 mmol/L (ref 98–111)
Creatinine, Ser: 1.05 mg/dL — ABNORMAL HIGH (ref 0.44–1.00)
GFR, Estimated: 55 mL/min — ABNORMAL LOW (ref >60)
Glucose, Bld: 135 mg/dL — ABNORMAL HIGH (ref 70–99)
Potassium: 3.8 mmol/L (ref 3.5–5.1)
Sodium: 137 mmol/L (ref 135–145)
Total Bilirubin: 1 mg/dL (ref 0.3–1.2)
Total Protein: 6.7 g/dL (ref 6.5–8.1)

## 2019-10-29 LAB — PROTIME-INR
INR: 1 (ref 0.8–1.2)
Prothrombin Time: 12.7 seconds (ref 11.4–15.2)

## 2019-10-29 LAB — I-STAT CHEM 8, ED
BUN: 26 mg/dL — ABNORMAL HIGH (ref 8–23)
Calcium, Ion: 1.16 mmol/L (ref 1.15–1.40)
Chloride: 105 mmol/L (ref 98–111)
Creatinine, Ser: 1 mg/dL (ref 0.44–1.00)
Glucose, Bld: 127 mg/dL — ABNORMAL HIGH (ref 70–99)
HCT: 39 % (ref 36.0–46.0)
Hemoglobin: 13.3 g/dL (ref 12.0–15.0)
Potassium: 3.8 mmol/L (ref 3.5–5.1)
Sodium: 139 mmol/L (ref 135–145)
TCO2: 23 mmol/L (ref 22–32)

## 2019-10-29 LAB — CBC
HCT: 41 % (ref 36.0–46.0)
Hemoglobin: 12.8 g/dL (ref 12.0–15.0)
MCH: 27.2 pg (ref 26.0–34.0)
MCHC: 31.2 g/dL (ref 30.0–36.0)
MCV: 87.2 fL (ref 80.0–100.0)
Platelets: 271 10*3/uL (ref 150–400)
RBC: 4.7 MIL/uL (ref 3.87–5.11)
RDW: 13.9 % (ref 11.5–15.5)
WBC: 6.8 10*3/uL (ref 4.0–10.5)
nRBC: 0 % (ref 0.0–0.2)

## 2019-10-29 LAB — APTT: aPTT: 28 seconds (ref 24–36)

## 2019-10-29 MED ORDER — SODIUM CHLORIDE 0.9% FLUSH
3.0000 mL | Freq: Once | INTRAVENOUS | Status: DC
Start: 2019-10-29 — End: 2019-10-30

## 2019-10-29 NOTE — ED Triage Notes (Signed)
Patient arrived with EMS from home reports intermittent numbness/tingling at right arm and right lower legs since Saturday last week . Alert and oriented , speech clear , no facial asymmetry , equal strong grips with no arm drift.

## 2019-10-30 ENCOUNTER — Emergency Department (HOSPITAL_COMMUNITY): Payer: Medicare Other

## 2019-10-30 ENCOUNTER — Encounter (HOSPITAL_COMMUNITY): Payer: Self-pay | Admitting: Emergency Medicine

## 2019-10-30 DIAGNOSIS — R202 Paresthesia of skin: Secondary | ICD-10-CM | POA: Diagnosis not present

## 2019-10-30 DIAGNOSIS — M9903 Segmental and somatic dysfunction of lumbar region: Secondary | ICD-10-CM | POA: Diagnosis not present

## 2019-10-30 DIAGNOSIS — M5136 Other intervertebral disc degeneration, lumbar region: Secondary | ICD-10-CM | POA: Diagnosis not present

## 2019-10-30 MED ORDER — CLOPIDOGREL BISULFATE 75 MG PO TABS: 75.0000 mg | ORAL_TABLET | Freq: Every day | ORAL | 0 refills | Status: DC

## 2019-10-30 NOTE — ED Notes (Signed)
Patient transported to MRI 

## 2019-10-30 NOTE — ED Notes (Signed)
Patient and family had a lot of questions about her previous stay at another hospital and tests that were completed and what they were. RN answered all questions to the best of ability. Patient is alert and oriented and without anymore questions at this time.

## 2019-10-30 NOTE — ED Provider Notes (Signed)
[facility] EMERGENCY DEPARTMENT Provider Note   CSN: [id number] Arrival date & time: 10/29/19  2043     History Chief Complaint  Patient presents with  . Numbness/Tingling    Sharon Marsh is a 74 y.o. female.  The history is provided by the patient and a relative.  Illness Location:  Right arm and right leg  Quality:  Tingling and numbness  Severity:  Moderate Onset quality:  Gradual Duration:  1 week Timing:  Intermittent Progression:  Waxing and waning Chronicity:  Recurrent Context:  Had a full stroke work up at [facility] and told everything was normal/   Relieved by:  Nothing  Worsened by:  Nothing  Ineffective treatments:  Aspirin and cholesterol medicines  Associated symptoms: no abdominal pain, no chest pain, no congestion, no cough, no diarrhea, no ear pain, no fatigue, no fever, no headaches, no loss of consciousness, no myalgias, no nausea, no rash, no rhinorrhea, no shortness of breath, no sore throat, no vomiting and no wheezing   Risk factors:  Elderly patient  Patient was admitted for stroke/TIA at [facility] last week and have MRI and other work up.  Was told all was ok and was discharged to home and symptoms recurred on Saturday and then worsened tonight.  No vision nor speech changes.       Past Medical History:  Diagnosis Date  . Anxiety state, unspecified 08/15/2013  . Bulging lumbar disc   . Cardiac murmur 07/02/2019  . Chest discomfort 07/02/2019  . GERD (gastroesophageal reflux disease)   . Hiatal hernia   . Hyperlipidemia 08/15/2013  . Hypertension   . Insomnia   . Lumbar spondylolysis   . 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 Obstructive sleep apnea   . Osteoarthritis of left knee 08/09/2017  . Osteoarthritis of right knee 10/03/2018  . Osteoporosis 02/12/2019  . Other and unspecified hyperlipidemia   . Personal history of vulvar dysplasia   . PONV (postoperative nausea and vomiting)   . Unspecified essential hypertension  08/15/2013  . Unspecified hereditary and idiopathic peripheral neuropathy 08/15/2013    Patient Active Problem List   Diagnosis Date Noted  . Elevated coronary artery calcium score 10/08/2019  . Essential hypertension 10/08/2019  . Mixed dyslipidemia 10/08/2019  . Obesity (BMI 30.0-34.9) 10/08/2019  . Chest discomfort 07/02/2019  . Cardiac murmur 07/02/2019  . 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 PONV (postoperative nausea and vomiting)   . Personal history of vulvar dysplasia   . Obstructive sleep apnea   . Insomnia   . Hypertension   . Hiatal hernia   . GERD (gastroesophageal reflux disease)   . Bulging lumbar disc   . Osteoporosis 02/12/2019  . Osteoarthritis of right knee 10/03/2018  . Osteoarthritis of left knee 08/09/2017  . Anxiety state, unspecified 08/15/2013  . Unspecified hereditary and idiopathic peripheral neuropathy 08/15/2013  . Unspecified essential hypertension 08/15/2013  . Other and unspecified hyperlipidemia 08/15/2013  . Hyperlipidemia 08/15/2013    Past Surgical History:  Procedure Laterality Date  . ABDOMINAL HYSTERECTOMY    . APPENDECTOMY  1950  . CATARACT EXTRACTION Bilateral 2008  . KNEE SURGERY Left 2018   Arthroscopic knee surg for torn meniscus  . THYROID SURGERY  1980  . VULVECTOMY N/A 06/22/2012   Procedure: WIDE EXCISION VULVECTOMY;  Surgeon: Allyn Kenner, DO;  Location: [place] [facility];  Service: Gynecology;  Laterality: N/A;  wide local excision of vulva  . wide local excision high grade VIN  06/22/2012   Dr  Rogue Bussing     OB History    Gravida  5   Para  2   Term  2   Preterm      AB      Living  2     SAB      TAB      Ectopic      Multiple      Live Births              Family History  Problem Relation Age of Onset  . Heart attack Father   . Hypercholesterolemia Father   . Hypertension Father   . Heart disease Mother   . Hypertension Mother   . Migraines Neg Hx     Social History   Tobacco Use  . Smoking  status: Former Research scientist (life sciences)  . Smokeless tobacco: Never Used  Substance Use Topics  . Alcohol use: Yes    Comment: socially  . Drug use: No    Home Medications Prior to Admission medications   Medication Sig Start Date End Date Taking? Authorizing Provider  Acetylcysteine (NAC) 600 MG CAPS Take by mouth.    [provider]  Ascorbic Acid ([employer] VITAMIN C) 1000 MG CHEW Chew 1,000 mg by mouth daily.    [provider]  aspirin EC 81 MG tablet Take 1 tablet (81 mg total) by mouth daily. Swallow whole. 10/08/19   Revankar, Reita Cliche, MD  b complex vitamins tablet Take 1 tablet by mouth daily.    [provider]  cholecalciferol (VITAMIN D3) 25 MCG (1000 UNIT) tablet Take 1,000 Units by mouth daily.    [provider]  Coenzyme Q10 (HM COQ-10) 200 MG capsule Take 1 capsule (200 mg total) by mouth daily. 10/08/19   Revankar, Reita Cliche, MD  fluticasone (FLONASE) 50 MCG/ACT nasal spray Place 2 sprays into both nostrils daily.    [provider]  ibuprofen (ADVIL) 800 MG tablet Take 800 mg by mouth 3 (three) times daily.    [provider]  LORazepam (ATIVAN) 0.5 MG tablet Take 0.5 mg by mouth every 8 (eight) hours as needed for anxiety. Patient not taking: Reported on 10/08/2019    [provider]  metoprolol (LOPRESSOR) 50 MG tablet Take 50 mg by mouth 2 (two) times daily. Take 1/2 tablet two times daily    [provider]  omeprazole (PRILOSEC) 20 MG capsule Take 20 mg by mouth daily.    [provider]  Pitavastatin Calcium 1 MG TABS Take 1 tablet (1 mg total) by mouth daily. 10/08/19   Revankar, Reita Cliche, MD  traMADol (ULTRAM) 50 MG tablet Take 50 mg by mouth every 4 (four) hours as needed.    [provider]  zolpidem (AMBIEN) 10 MG tablet Take 10 mg by mouth at bedtime as needed for sleep.    [provider]    Allergies    Clindamycin, Clindamycin/lincomycin, and Latex  Review of Systems   Review of  Systems  Constitutional: Negative for fatigue and fever.  HENT: Negative for congestion, ear pain, rhinorrhea and sore throat.   Eyes: Negative for visual disturbance.  Respiratory: Negative for cough, shortness of breath and wheezing.   Cardiovascular: Negative for chest pain.  Gastrointestinal: Negative for abdominal pain, diarrhea, nausea and vomiting.  Genitourinary: Negative for difficulty urinating.  Musculoskeletal: Negative for myalgias.  Skin: Negative for rash.  Neurological: Positive for numbness. Negative for loss of consciousness, speech difficulty, weakness and headaches.  Psychiatric/Behavioral: Negative for  agitation.  All other systems reviewed and are negative.   Physical Exam Updated Vital Signs BP (!) 150/70   Pulse 68   Temp 98.2 F (36.8 C) (Oral)   Resp 16   Ht 5\' 3"  (1.6 m)   Wt 85 kg   SpO2 94%   BMI 33.19 kg/m   Physical Exam Vitals and nursing note reviewed.  Constitutional:      General: She is not in acute distress.    Appearance: Normal appearance.  HENT:     Head: Normocephalic and atraumatic.     Nose: Nose normal.     Mouth/Throat:     Mouth: Mucous membranes are moist.     Pharynx: Oropharynx is clear.  Eyes:     Extraocular Movements: Extraocular movements intact.     Conjunctiva/sclera: Conjunctivae normal.     Pupils: Pupils are equal, round, and reactive to light.  Cardiovascular:     Rate and Rhythm: Normal rate and regular rhythm.     Pulses: Normal pulses.     Heart sounds: Normal heart sounds.  Pulmonary:     Effort: Pulmonary effort is normal.     Breath sounds: Normal breath sounds.  Abdominal:     General: Abdomen is flat. Bowel sounds are normal.     Palpations: Abdomen is soft.     Tenderness: There is no abdominal tenderness. There is no guarding or rebound.  Musculoskeletal:        General: Normal range of motion.     Cervical back: Normal range of motion and neck supple.  Skin:    General: Skin is warm and  dry.     Capillary Refill: Capillary refill takes less than 2 seconds.  Neurological:     General: No focal deficit present.     Mental Status: She is alert and oriented to person, place, and time.     Cranial Nerves: No cranial nerve deficit.     Motor: No weakness.     Deep Tendon Reflexes: Reflexes normal.  Psychiatric:        Mood and Affect: Mood normal.        Behavior: Behavior normal.     ED Results / Procedures / Treatments   Labs (all labs ordered are listed, but only abnormal results are displayed) Results for orders placed or performed during the hospital encounter of 10/29/19  Protime-INR  Result Value Ref Range   Prothrombin Time 12.7 11.4 - 15.2 seconds   INR 1.0 0.8 - 1.2  APTT  Result Value Ref Range   aPTT 28 24 - 36 seconds  CBC  Result Value Ref Range   WBC 6.8 4.0 - 10.5 K/uL   RBC 4.70 3.87 - 5.11 MIL/uL   Hemoglobin 12.8 12.0 - 15.0 g/dL   HCT 41.0 36 - 46 %   MCV 87.2 80.0 - 100.0 fL   MCH 27.2 26.0 - 34.0 pg   MCHC 31.2 30.0 - 36.0 g/dL   RDW 13.9 11.5 - 15.5 %   Platelets 271 150 - 400 K/uL   nRBC 0.0 0.0 - 0.2 %  Differential  Result Value Ref Range   Neutrophils Relative % 63 %   Neutro Abs 4.3 1.7 - 7.7 K/uL   Lymphocytes Relative 28 %   Lymphs Abs 1.9 0.7 - 4.0 K/uL   Monocytes Relative 7 %   Monocytes Absolute 0.5 0.1 - 1.0 K/uL   Eosinophils Relative 1 %   Eosinophils Absolute 0.1 0.0 - 0.5  K/uL   Basophils Relative 1 %   Basophils Absolute 0.1 0.0 - 0.1 K/uL   Immature Granulocytes 0 %   Abs Immature Granulocytes 0.01 0.00 - 0.07 K/uL  Comprehensive metabolic panel  Result Value Ref Range   Sodium 137 135 - 145 mmol/L   Potassium 3.8 3.5 - 5.1 mmol/L   Chloride 104 98 - 111 mmol/L   CO2 23 22 - 32 mmol/L   Glucose, Bld 135 (H) 70 - 99 mg/dL   BUN 23 8 - 23 mg/dL   Creatinine, Ser 1.05 (H) 0.44 - 1.00 mg/dL   Calcium 8.8 (L) 8.9 - 10.3 mg/dL   Total Protein 6.7 6.5 - 8.1 g/dL   Albumin 3.9 3.5 - 5.0 g/dL   AST 18 15 - 41  U/L   ALT 19 0 - 44 U/L   Alkaline Phosphatase 68 38 - 126 U/L   Total Bilirubin 1.0 0.3 - 1.2 mg/dL   GFR, Estimated 55 (L) >60 mL/min   Anion gap 10 5 - 15  I-stat chem 8, ED  Result Value Ref Range   Sodium 139 135 - 145 mmol/L   Potassium 3.8 3.5 - 5.1 mmol/L   Chloride 105 98 - 111 mmol/L   BUN 26 (H) 8 - 23 mg/dL   Creatinine, Ser 1.00 0.44 - 1.00 mg/dL   Glucose, Bld 127 (H) 70 - 99 mg/dL   Calcium, Ion 1.16 1.15 - 1.40 mmol/L   TCO2 23 22 - 32 mmol/L   Hemoglobin 13.3 12.0 - 15.0 g/dL   HCT 39.0 36 - 46 %   CT HEAD WO CONTRAST  Result Date: 10/29/2019 CLINICAL DATA:  Numbness and tingling EXAM: CT HEAD WITHOUT CONTRAST TECHNIQUE: Contiguous axial images were obtained from the base of the skull through the vertex without intravenous contrast. COMPARISON:  October 28, 2019 MRI FINDINGS: Brain: No evidence of acute territorial infarction, hemorrhage, hydrocephalus,extra-axial collection or mass lesion/mass effect. The ventricles are normal in size and contour. Low-attenuation changes in the deep white matter consistent with small vessel ischemia. Vascular: No hyperdense vessel or unexpected calcification. Skull: The skull is intact. No fracture or focal lesion identified. Sinuses/Orbits: The visualized paranasal sinuses and mastoid air cells are clear. The orbits and globes intact. Other: None IMPRESSION: No acute intracranial abnormality. 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   On: 10/29/2019 21:26   MR BRAIN WO CONTRAST  Result Date: 10/30/2019 CLINICAL DATA:  Paresthesias EXAM: MRI HEAD WITHOUT CONTRAST TECHNIQUE: Multiplanar, multiecho pulse sequences of the brain and surrounding structures were obtained without intravenous contrast. COMPARISON:  Brain MRI 10/28/2019 FINDINGS: Brain: No acute infarct, acute hemorrhage or extra-axial collection. Diffuse confluent hyperintense T2-weighted signal within the  periventricular, deep and juxtacortical white matter. Normal volume of CSF spaces. No chronic microhemorrhage. Normal midline structures. Vascular: Normal flow voids. Skull and upper cervical spine: Normal marrow signal. Sinuses/Orbits: Negative. Other: None. IMPRESSION: 1. No acute intracranial abnormality. 2. Diffuse confluent hyperintense T2-weighted signal within the periventricular, deep and juxtacortical white matter, most consistent with chronic ischemic microangiopathy. Electronically Signed   By: Ulyses Jarred M.D.   On: 10/30/2019 02:56   CT CARDIAC SCORING  Addendum Date: 10/01/2019   ADDENDUM REPORT: 10/01/2019 18:05 CLINICAL DATA:  Risk stratification EXAM: Coronary Calcium Score TECHNIQUE: The patient was scanned on a [employer]. Axial non-contrast 3 mm slices were carried out through the heart. The data set was analyzed on a dedicated  work station and scored using the [employer]. FINDINGS: Non-cardiac: See separate report from [facility]. Ascending Aorta: Normal Pericardium: Normal Coronary arteries: Normal origin IMPRESSION: Coronary calcium score of 226 . This was 35 percentile for age and sex matched control. Kardie Tobb,DO Electronically Signed   By: Berniece Salines DO   On: 10/01/2019 18:05   Result Date: 10/01/2019 EXAM: OVER-READ INTERPRETATION  CT CHEST The following report is an over-read performed by radiologist Dr. Aletta Edouard of [facility], [place] on 10/01/2019. This over-read does not include interpretation of cardiac or coronary anatomy or pathology. The coronary calcium score interpretation by the cardiologist is attached. COMPARISON:  None. FINDINGS: Vascular: No significant vascular findings. Normal heart size. No pericardial effusion. Mediastinum/Nodes: No visualized enlarged mediastinal or axillary lymph nodes. Lungs/Pleura: Visualized lungs show no evidence of pulmonary edema, consolidation, pneumothorax, nodule or pleural fluid. Upper Abdomen:  No acute abnormality. Musculoskeletal: No chest wall mass or suspicious bone lesions identified. IMPRESSION: No significant incidental findings. Electronically Signed: By: Aletta Edouard M.D. On: 10/01/2019 13:46    Radiology CT HEAD WO CONTRAST  Result Date: 10/29/2019 CLINICAL DATA:  Numbness and tingling EXAM: CT HEAD WITHOUT CONTRAST TECHNIQUE: Contiguous axial images were obtained from the base of the skull through the vertex without intravenous contrast. COMPARISON:  October 28, 2019 MRI FINDINGS: Brain: No evidence of acute territorial infarction, hemorrhage, hydrocephalus,extra-axial collection or mass lesion/mass effect. The ventricles are normal in size and contour. Low-attenuation changes in the deep white matter consistent with small vessel ischemia. Vascular: No hyperdense vessel or unexpected calcification. Skull: The skull is intact. No fracture or focal lesion identified. Sinuses/Orbits: The visualized paranasal sinuses and mastoid air cells are clear. The orbits and globes intact. Other: None IMPRESSION: No acute intracranial abnormality. 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   On: 10/29/2019 21:26   MR BRAIN WO CONTRAST  Result Date: 10/30/2019 CLINICAL DATA:  Paresthesias EXAM: MRI HEAD WITHOUT CONTRAST TECHNIQUE: Multiplanar, multiecho pulse sequences of the brain and surrounding structures were obtained without intravenous contrast. COMPARISON:  Brain MRI 10/28/2019 FINDINGS: Brain: No acute infarct, acute hemorrhage or extra-axial collection. Diffuse confluent hyperintense T2-weighted signal within the periventricular, deep and juxtacortical white matter. Normal volume of CSF spaces. No chronic microhemorrhage. Normal midline structures. Vascular: Normal flow voids. Skull and upper cervical spine: Normal marrow signal. Sinuses/Orbits: Negative. Other: None. IMPRESSION: 1. No acute intracranial abnormality. 2.  Diffuse confluent hyperintense T2-weighted signal within the periventricular, deep and juxtacortical white matter, most consistent with chronic ischemic microangiopathy. Electronically Signed   By: Ulyses Jarred M.D.   On: 10/30/2019 02:56    Procedures Procedures (including critical care time)  Medications Ordered in ED Medications  sodium chloride flush (NS) 0.9 % injection 3 mL (has no administration in time range)    ED Course  I have reviewed the triage vital signs and the nursing notes.  Pertinent labs & imaging results that were available during my care of the patient were reviewed by me and considered in my medical decision making (see chart for details).    313 am case d/w Dr. Theda Sers of neurology, case and MRI results reviewed.  Start placix 75 mg PO daily for 3 weeks in addition to the aspirin the patient is already taking and have the patient follow up with neurology as an outpatient.    Sharon Marsh was evaluated in Emergency Department on 10/30/2019 for the symptoms described in the  history of present illness. She was evaluated in the context of the global COVID-19 pandemic, which necessitated consideration that the patient might be at risk for infection with the SARS-CoV-2 virus that causes COVID-19. Institutional protocols and algorithms that pertain to the evaluation of patients at risk for COVID-19 are in a state of rapid change based on information released by regulatory bodies including the CDC and federal and state organizations. These policies and algorithms were followed during the patient's care in the ED.   plavix RX initiated and patient referred to neurology as an outpatient   Final Clinical Impression(s) / ED Diagnoses  Return for intractable cough, coughing up blood,fevers >100.4 unrelieved by medication, shortness of breath, intractable vomiting, chest pain, shortness of breath, weakness,numbness, changes in speech, facial asymmetry,abdominal pain,  passing out,Inability to tolerate liquids or food, cough, altered mental status or any concerns. No signs of systemic illness or infection. The patient is nontoxic-appearing on exam and vital signs are within normal limits.   I have reviewed the triage vital signs and the nursing notes. Pertinent labs &imaging results that were available during my care of the patient were reviewed by me and considered in my medical decision making (see chart for details).After history, exam, and medical workup I feel the patient has beenappropriately medically screened and is safe for discharge home. Pertinent diagnoses were discussed with the patient. Patient was given return precautions.   Tashai Catino, MD 10/30/19 [id number]

## 2019-10-31 DIAGNOSIS — M5136 Other intervertebral disc degeneration, lumbar region: Secondary | ICD-10-CM | POA: Diagnosis not present

## 2019-10-31 DIAGNOSIS — M9903 Segmental and somatic dysfunction of lumbar region: Secondary | ICD-10-CM | POA: Diagnosis not present

## 2019-11-06 DIAGNOSIS — M9903 Segmental and somatic dysfunction of lumbar region: Secondary | ICD-10-CM | POA: Diagnosis not present

## 2019-11-06 DIAGNOSIS — M5136 Other intervertebral disc degeneration, lumbar region: Secondary | ICD-10-CM | POA: Diagnosis not present

## 2019-11-07 ENCOUNTER — Ambulatory Visit (INDEPENDENT_AMBULATORY_CARE_PROVIDER_SITE_OTHER): Payer: Medicare Other | Admitting: Neurology

## 2019-11-07 ENCOUNTER — Encounter: Payer: Self-pay | Admitting: Neurology

## 2019-11-07 VITALS — BP 144/76 | HR 66 | Ht 63.0 in | Wt 172.0 lb

## 2019-11-07 DIAGNOSIS — G459 Transient cerebral ischemic attack, unspecified: Secondary | ICD-10-CM | POA: Diagnosis not present

## 2019-11-07 DIAGNOSIS — R931 Abnormal findings on diagnostic imaging of heart and coronary circulation: Secondary | ICD-10-CM | POA: Diagnosis not present

## 2019-11-07 NOTE — Progress Notes (Signed)
[facility]    Provider:  Dr Jaynee Eagles Requesting Provider: Lowella Dandy, NP Primary Care Provider:  Lowella Dandy, NP  CC:  TIA  HPI:  Sharon Marsh is a 74 y.o. female here as requested by Lowella Dandy, NP for TIA. Patient was seen at [facility] for right arm and right leg tingling and numbness.MRI of the brain did not show any acute events. She was sent home on DUAP therapy for 3 weeks. Then admitted to [facility], stroke workup including  MRI brain, MRA head, Carotid U/S and echo without acute etiology. PMHx HTN, HLD, OSA, anxiety.  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 They went to a concert the next day after ziplineing and she was in front of the sound board and the base was very loud and she felt vibration in her chest and she felt it through her. She has had the right arm numbness and tingling for a year it was not new but the leg was new, started feeling the right arm numbness/tingling the same as usual . No pain, numbness started in the right hand and moved up the right arm then she noticed her leg getting numb (she was sitting), then the whole right leg went numb and tingy just like her hand, she could walk, no problems with speech, strength was good, no facial droop. She didn't feel right, they drove home and it didn;t go away, they called 911 and brought her to [place] and she was told she has "mini-strokes" and declined TPA. Her blood pressure was significantly elevated > 161 systolic, she went to [facility] after another episode and she was discharged from the ED home after [facility] admitted and ran every test. She was not taking aspirin at the time.   Reviewed notes, labs and imaging from outside physicians, which showed:  IMPRESSION: MRI brain 10/30/2019: reviewed images and agree with findings below 1. No acute intracranial abnormality. 2. Diffuse  confluent hyperintense T2-weighted signal within the periventricular, deep and juxtacortical white matter, most consistent with chronic ischemic microangiopathy.  07/2019: Echocardiogram "unremarkable" per Dr. Salley Scarlet notes  IMPRESSIONS (Patient had repeat echo at [facility] as well per patient also unremarkable I do not have those records)  1. Left ventricular ejection fraction, by estimation, is 60 to 65%. The  left ventricle has normal function. The left ventricle has no regional  wall motion abnormalities. There is mild concentric left ventricular  hypertrophy. Left ventricular diastolic  parameters are consistent with Grade I diastolic dysfunction (impaired  relaxation).  2. Right ventricular systolic function is normal. The right ventricular  size is normal. There is normal pulmonary artery systolic pressure.  3. The mitral valve is degenerative. Mild mitral valve regurgitation. No  evidence of mitral stenosis.  4. The aortic valve is tricuspid. Aortic valve regurgitation is mild. No  aortic stenosis is present.  5. The inferior vena cava is normal in size with greater than 50%  respiratory variability, suggesting right atrial pressure of 3 mmHg.  US carotid: reviewed reports: RIGHT CAROTID ARTERY: Mild tortuosity. Minimal plaque in the bulb without significant stenosis. Normal waveforms and color Doppler signal. 1.4 cm right thyroid nodule incidentally noted; not clinically significant, no follow-up imaging recommended.  RIGHT VERTEBRAL ARTERY: Normal flow direction and waveform.  LEFT CAROTID ARTERY 10/24: Normal waveforms and color Doppler signal. Mild eccentric plaque  in the ICA resulting in only mild stenosis. Mild tortuosity.  LEFT VERTEBRAL ARTERY: Normal flow direction and waveform.  IMPRESSION:  reviewed reports: 1. Bilateral carotid bifurcation plaque resulting in less than 50% diameter ICA stenosis. 2. Antegrade bilateral vertebral arterial flow.  MRI  Brain and MRA Head 10/25:  IMPRESSION: MRI brain:  1. No evidence of acute intracranial abnormality, including acute infarction. 2. Severe chronic small vessel ischemic disease, progressed as compared to the head CT of 08/03/2013  MRA head:  1. No intracranial large vessel occlusion. 2. Intracranial atherosclerotic disease with multifocal stenoses, most notably as follows. 3. Atherosclerotic irregularity of the M2 and more distal middle cerebral artery branches bilaterally without high-grade proximal M2 stenosis. 4. Moderate/severe focal stenosis of the distal V4 left vertebral artery. 5. Mild/moderate stenosis within the mid basilar artery. 6. Mild atherosclerotic narrowing of the P1 right posterior cerebral artery. 7. 2 mm inferiorly projecting vascular protrusions arising from the supraclinoid ICAs bilaterally, which may reflect infundibula or small aneurysms.   Review of Systems: Patient complains of symptoms per HPI as well as the following symptoms: anxiety. Pertinent negatives and positives per HPI. All others negative.   Social History   Socioeconomic History  . Marital status: Widowed    Spouse name: Not on file  . Number of children: 2  . Years of education: college  . Highest education level: Not on file  Occupational History  . Not on file  Tobacco Use  . Smoking status: Former Research scientist (life sciences)  . Smokeless tobacco: Never Used  Vaping Use  . Vaping Use: Never used  Substance and Sexual Activity  . Alcohol use: Yes    Comment: socially "very little"  . Drug use: No  . Sexual activity: Yes    Birth control/protection: None  Other Topics Concern  . Not on file  Social History Narrative   Patient is widowed with 2 children (1 of them passed in 2012).   Patient is right handed.   Patient has some college.   Caffeine: stopped    Social Determinants of Radio broadcast assistant Strain:   . Difficulty of Paying Living Expenses: Not on file  Food  Insecurity:   . Worried About Charity fundraiser in the Last Year: Not on file  . Ran Out of Food in the Last Year: Not on file  Transportation Needs:   . Lack of Transportation (Medical): Not on file  . Lack of Transportation (Non-Medical): Not on file  Physical Activity:   . Days of Exercise per Week: Not on file  . Minutes of Exercise per Session: Not on file  Stress:   . Feeling of Stress : Not on file  Social Connections:   . Frequency of Communication with Friends and Family: Not on file  . Frequency of Social Gatherings with Friends and Family: Not on file  . Attends Religious Services: Not on file  . Active Member of Clubs or Organizations: Not on file  . Attends Archivist Meetings: Not on file  . Marital Status: Not on file  Intimate Partner Violence:   . Fear of Current or Ex-Partner: Not on file  . Emotionally Abused: Not on file  . Physically Abused: Not on file  . Sexually Abused: Not on file    Family History  Problem Relation Age of Onset  . Heart attack Father   . Hypercholesterolemia Father   . Hypertension Father   . Heart disease Mother   . Hypertension Mother   .  Migraines Neg Hx   . Stroke Neg Hx   . Transient ischemic attack Neg Hx     Past Medical History:  Diagnosis Date  . Anxiety state, unspecified 08/15/2013  . Bulging lumbar disc   . Cardiac murmur 07/02/2019  . Chest discomfort 07/02/2019  . GERD (gastroesophageal reflux disease)   . Hiatal hernia   . Hyperlipidemia 08/15/2013  . Hypertension   . Insomnia   . Lumbar spondylolysis   . 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 Obstructive sleep apnea   . Osteoarthritis of left knee 08/09/2017  . Osteoarthritis of right knee 10/03/2018  . Osteoporosis 02/12/2019  . Other and unspecified hyperlipidemia   . Personal history of vulvar dysplasia   . PONV (postoperative nausea and vomiting)   . Unspecified essential hypertension 08/15/2013  . Unspecified hereditary and idiopathic peripheral  neuropathy 08/15/2013    Patient Active Problem List   Diagnosis Date Noted  . Elevated coronary artery calcium score 10/08/2019  . Essential hypertension 10/08/2019  . Mixed dyslipidemia 10/08/2019  . Obesity (BMI 30.0-34.9) 10/08/2019  . Chest discomfort 07/02/2019  . Cardiac murmur 07/02/2019  . 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 PONV (postoperative nausea and vomiting)   . Personal history of vulvar dysplasia   . Obstructive sleep apnea   . Insomnia   . Hypertension   . Hiatal hernia   . GERD (gastroesophageal reflux disease)   . Bulging lumbar disc   . Osteoporosis 02/12/2019  . Osteoarthritis of right knee 10/03/2018  . Osteoarthritis of left knee 08/09/2017  . Anxiety state, unspecified 08/15/2013  . Unspecified hereditary and idiopathic peripheral neuropathy 08/15/2013  . Unspecified essential hypertension 08/15/2013  . Other and unspecified hyperlipidemia 08/15/2013  . Hyperlipidemia 08/15/2013    Past Surgical History:  Procedure Laterality Date  . ABDOMINAL HYSTERECTOMY    . APPENDECTOMY  1950  . CATARACT EXTRACTION Bilateral 2008  . KNEE SURGERY Left 2018   Arthroscopic knee surg for torn meniscus  . THYROID SURGERY  1980  . VULVECTOMY N/A 06/22/2012   Procedure: WIDE EXCISION VULVECTOMY;  Surgeon: Allyn Kenner, DO;  Location: [facility];  Service: Gynecology;  Laterality: N/A;  wide local excision of vulva  . wide local excision high grade VIN  06/22/2012   Dr Rogue Bussing    Current Outpatient Medications  Medication Sig Dispense Refill  . Acetylcysteine (NAC) 600 MG CAPS Take 600 mg by mouth in the morning and at bedtime.     Marland Kitchen amoxicillin-clavulanate (AUGMENTIN) 875-125 MG tablet Take 1 tablet by mouth 2 (two) times daily.    . Ascorbic Acid ([employer]S VITAMIN C) 1000 MG CHEW Chew 1,000 mg by mouth daily.    Marland Kitchen aspirin EC 81 MG tablet Take 1 tablet (81 mg total) by mouth daily. Swallow whole. 90 tablet 3  . b complex vitamins tablet Take 1 tablet by mouth daily.    .  cholecalciferol (VITAMIN D3) 25 MCG (1000 UNIT) tablet Take 1,000 Units by mouth daily.    . clopidogrel (PLAVIX) 75 MG tablet Take 1 tablet (75 mg total) by mouth daily. 21 tablet 0  . Coenzyme Q10 (HM COQ-10) 200 MG capsule Take 1 capsule (200 mg total) by mouth daily. 90 capsule 3  . ezetimibe (ZETIA) 10 MG tablet Take 10 mg by mouth daily.    . fluticasone (FLONASE) 50 MCG/ACT nasal spray Place 2 sprays into both nostrils daily.    Marland Kitchen ibuprofen (ADVIL) 800 MG tablet Take 800 mg by mouth 3 (three) times daily.    Marland Kitchen  LORazepam (ATIVAN) 0.5 MG tablet Take 0.5 mg by mouth every 8 (eight) hours as needed for anxiety.     . metoprolol (LOPRESSOR) 50 MG tablet Take 50 mg by mouth 2 (two) times daily. Take 1/2 tablet two times daily    . omeprazole (PRILOSEC) 20 MG capsule Take 20 mg by mouth daily.    . Pitavastatin Calcium 1 MG TABS Take 1 tablet (1 mg total) by mouth daily. 30 tablet 6  . predniSONE (DELTASONE) 20 MG tablet Take 20 mg by mouth 2 (two) times daily.    . rosuvastatin (CRESTOR) 5 MG tablet Take 5 mg by mouth daily.    . traMADol (ULTRAM) 50 MG tablet Take 50 mg by mouth every 4 (four) hours as needed.    . zolpidem (AMBIEN) 10 MG tablet Take 10 mg by mouth at bedtime as needed for sleep.     No current facility-administered medications for this visit.    Allergies as of 11/07/2019 - Review Complete 11/07/2019  Allergen Reaction Noted  . Clindamycin Other (See Comments) 10/07/2019  . Clindamycin/lincomycin Hypertension 07/13/2011  . Doxycycline  10/30/2019  . Latex Other (See Comments) 06/22/2012    Vitals: BP (!) 144/76 (BP Location: Left Arm, Patient Position: Sitting)   Pulse 66   Ht 5\' 3"  (1.6 m)   Wt 172 lb (78 kg)   BMI 30.47 kg/m  Last Weight:  Wt Readings from Last 1 Encounters:  11/07/19 172 lb (78 kg)   Last Height:   Ht Readings from Last 1 Encounters:  11/07/19 5\' 3"  (1.6 m)     Physical exam: Exam: Gen: NAD, conversant, well nourised, obese, well  groomed                     Eyes: Conjunctivae clear without exudates or hemorrhage  Neuro: Detailed Neurologic Exam  Speech:    Speech is normal; fluent and spontaneous with normal comprehension.  Cognition:    The patient is oriented to person, place, and time;     recent and remote memory intact;     language fluent;     normal attention, concentration,     fund of knowledge Cranial Nerves:    The pupils are equal, round, and reactive to light. Visual fields are full. Extraocular movements are intact. Trigeminal sensation is intact. The face is symmetric.  Hearing intact. Voice is normal. Shoulder shrug is normal. The tongue has normal motion  Coordination:    No dysmetria or ataxia  Gait:    Normal native gait  Motor Observation:    No asymmetry, no atrophy, and no involuntary movements noted. Tone:    Normal muscle tone.    Posture:    Posture is normal. normal erect    Strength:    Strength is equal in the upper and lower limbs, no focal deficits noted     Sensation: intact to LT      Assessment/Plan:  74 y.o. female here as requested by Lowella Dandy, NP for TIA. Patient was seen at [facility] for right arm and right leg tingling and numbness.MRI of the brain did not show any acute events. She was sent home on DUAP therapy for 3 weeks. Then admitted to [facility], stroke workup including  MRI brain, MRA head, Carotid U/S and echo without acute etiology. PMHx HTN, HLD, OSA, anxiety, obesity.  - Reviewed white matter/chronic small-vessel disease with patient due to uncontrolled risk factors, this increases her risk of stroke advised  close control of her vascular risk factors.  - Aspirin and Plavix for 3 weeks 11/16 then stop Plavix and STAY ON ASPIRIN - Consider Praluent or Repatha for cholesterol (injections) - Extended face-to-face time with patient over 60 minutes answering questions and reviewing blood work and imaging with patient.   I had a long d/w patient about  her recent TIA, risk for recurrent stroke/TIAs, personally independently reviewed imaging studies and stroke evaluation results and answered questions.Continue DUAP for 3 weeks then ASA 81mg  alone for secondary stroke prevention and maintain strict control of hypertension with blood pressure goal below 130/90, diabetes with hemoglobin A1c goal below 6.5% and lipids with LDL cholesterol goal below 70 mg/dL.Patient has h/o statin intolerance hence recommend new PCSK9 inhibitor like Praluent or Repatha. Recommend she see her primary MD to get prescription. I also advised the patient to eat a healthy diet with plenty of whole grains, fruits and vegetables, lean meats, exercise regularly and maintain ideal body weight .   Cc: Lowella Dandy, NP  Sharon Ill, MD  [facility] [address] [place], [place] [address]  Phone [phone] Fax [phone]  I spent over 110 minutes of face-to-face and non-face-to-face time with patient on the  1. TIA (transient ischemic attack)    diagnosis.  This included previsit chart review, lab review, study review, order entry, electronic health record documentation, patient education on the different diagnostic and therapeutic options, counseling and coordination of care, risks and benefits of management, compliance, or risk factor reduction

## 2019-11-07 NOTE — Patient Instructions (Addendum)
Goal LDL <70 HgbA1c < 6.5  Aspirin and Plavix for 3 weeks 11/16 then stop Plavix and STAY ON ASPIRIN Consider Praluent or Repatha for cholesterol (injections)  Transient Ischemic Attack  A transient ischemic attack (TIA) is a "warning stroke" that causes stroke-like symptoms that go away quickly. A TIA does not cause lasting damage to the brain. But having a TIA is a sign that you may be at risk for a stroke. Lifestyle changes and medical treatments can help prevent a stroke. It is important to know the symptoms of a TIA and what to do. Get help right away, even if your symptoms go away. The symptoms of a TIA are the same as those of a stroke. They can happen fast, and they usually go away within minutes or hours. They can include:  Weakness or loss of feeling in your face, arm, or leg. This often happens on one side of your body.  Trouble walking.  Trouble moving your arms or legs.  Trouble talking or understanding what people are saying.  Trouble seeing.  Seeing two of one object (double vision).  Feeling dizzy.  Feeling confused.  Loss of balance or coordination.  Feeling sick to your stomach (nauseous) and throwing up (vomiting).  A very bad headache for no reason. What increases the risk? Certain things may make you more likely to have a TIA. Some of these are things that you can change, such as:  Being very overweight (obese).  Using products that contain nicotine or tobacco, such as cigarettes and e-cigarettes.  Taking birth control pills.  Not being active.  Drinking too much alcohol.  Using drugs. Other risk factors include:  Having an irregular heartbeat (atrial fibrillation).  Being African American or Hispanic.  Having had blood clots, stroke, TIA, or heart attack in the past.  Being a woman with a history of high blood pressure in pregnancy (preeclampsia).  Being over the age of 38.  Being female.  Having family history of stroke.  Having the  following diseases or conditions: ? High blood pressure. ? High cholesterol. ? Diabetes. ? Heart disease. ? Sickle cell disease. ? Sleep apnea. ? Migraine headache. ? Long-term (chronic) diseases that cause soreness and swelling (inflammation). ? Disorders that affect how your blood clots. Follow these instructions at home: Medicines   Take over-the-counter and prescription medicines only as told by your doctor.  If you were told to take aspirin or another medicine to thin your blood, take it exactly as told by your doctor. ? Taking too much of the medicine can cause bleeding. ? Taking too little of the medicine may not work to treat the problem. Eating and drinking   Eat 5 or more servings of fruits and vegetables each day.  Follow instructions from your doctor about your diet. You may need to follow a certain diet to help lower your risk of having a stroke. You may need to: ? Eat a diet that is low in fat and salt. ? Eat foods that contain a lot of fiber. ? Limit the amount of carbohydrates and sugar in your diet.  Limit alcohol intake to 1 drink a day for nonpregnant women and 2 drinks a day for men. One drink equals 12 oz of beer, 5 oz of wine, or 1 oz of hard liquor. General instructions  Keep a healthy weight.  Stay active. Try to get at least 30 minutes of activity on all or most days.  Find out if you have a  condition called sleep apnea. Get treatment if needed.  Do not use any products that contain nicotine or tobacco, such as cigarettes and e-cigarettes. If you need help quitting, ask your doctor.  Do not abuse drugs.  Keep all follow-up visits as told by your doctor. This is important. Get help right away if:  You have any signs of stroke. "BE FAST" is an easy way to remember the main warning signs: ? B - Balance. Signs are dizziness, sudden trouble walking, or loss of balance. ? E - Eyes. Signs are trouble seeing or a sudden change in how you see. ? F -  Face. Signs are sudden weakness or loss of feeling of the face, or the face or eyelid drooping on one side. ? A - Arms. Signs are weakness or loss of feeling in an arm. This happens suddenly and usually on one side of the body. ? S - Speech. Signs are sudden trouble speaking, slurred speech, or trouble understanding what people say. ? T - Time. Time to call emergency services. Write down what time symptoms started.  You have other signs of stroke, such as: ? A sudden, very bad headache with no known cause. ? Feeling sick to your stomach (nausea). ? Throwing up (vomiting). ? Jerky movements that you cannot control (seizure). These symptoms may be an emergency. Do not wait to see if the symptoms will go away. Get medical help right away. Call your local emergency services (911 in the [place] Do not drive yourself to the hospital. Summary  A transient ischemic attack (TIA) is a "warning stroke" that causes stroke-like symptoms that go away quickly.  A TIA is a medical emergency. Get help right away, even if your symptoms go away.  A TIA does not cause lasting damage to the brain.  Having a TIA is a sign that you may be at risk for a stroke. Lifestyle changes and medical treatments can help prevent a stroke. This information is not intended to replace advice given to you by your health care provider. Make sure you discuss any questions you have with your health care provider. Document Revised: 09/15/2017 Document Reviewed: 03/23/2016 Elsevier Patient Education  [place].

## 2019-11-19 DIAGNOSIS — M5136 Other intervertebral disc degeneration, lumbar region: Secondary | ICD-10-CM | POA: Diagnosis not present

## 2019-11-19 DIAGNOSIS — M9903 Segmental and somatic dysfunction of lumbar region: Secondary | ICD-10-CM | POA: Diagnosis not present

## 2019-12-03 DIAGNOSIS — M5136 Other intervertebral disc degeneration, lumbar region: Secondary | ICD-10-CM | POA: Diagnosis not present

## 2019-12-03 DIAGNOSIS — M9903 Segmental and somatic dysfunction of lumbar region: Secondary | ICD-10-CM | POA: Diagnosis not present

## 2019-12-10 DIAGNOSIS — M5136 Other intervertebral disc degeneration, lumbar region: Secondary | ICD-10-CM | POA: Diagnosis not present

## 2019-12-10 DIAGNOSIS — M9903 Segmental and somatic dysfunction of lumbar region: Secondary | ICD-10-CM | POA: Diagnosis not present

## 2019-12-12 DIAGNOSIS — M9903 Segmental and somatic dysfunction of lumbar region: Secondary | ICD-10-CM | POA: Diagnosis not present

## 2019-12-12 DIAGNOSIS — M5136 Other intervertebral disc degeneration, lumbar region: Secondary | ICD-10-CM | POA: Diagnosis not present

## 2019-12-16 ENCOUNTER — Telehealth: Payer: Self-pay | Admitting: Cardiology

## 2019-12-16 NOTE — Telephone Encounter (Signed)
OK by me 

## 2019-12-16 NOTE — Telephone Encounter (Signed)
  Patient would like to switch from Dr Geraldo Pitter to Dr Harrell Gave

## 2019-12-16 NOTE — Telephone Encounter (Signed)
ok 

## 2019-12-24 DIAGNOSIS — M5136 Other intervertebral disc degeneration, lumbar region: Secondary | ICD-10-CM | POA: Diagnosis not present

## 2019-12-24 DIAGNOSIS — M9903 Segmental and somatic dysfunction of lumbar region: Secondary | ICD-10-CM | POA: Diagnosis not present

## 2019-12-31 DIAGNOSIS — M9903 Segmental and somatic dysfunction of lumbar region: Secondary | ICD-10-CM | POA: Diagnosis not present

## 2019-12-31 DIAGNOSIS — M5136 Other intervertebral disc degeneration, lumbar region: Secondary | ICD-10-CM | POA: Diagnosis not present

## 2020-01-08 ENCOUNTER — Ambulatory Visit: Payer: Medicare Other | Admitting: Cardiology

## 2020-01-09 DIAGNOSIS — M5136 Other intervertebral disc degeneration, lumbar region: Secondary | ICD-10-CM | POA: Diagnosis not present

## 2020-01-09 DIAGNOSIS — M9903 Segmental and somatic dysfunction of lumbar region: Secondary | ICD-10-CM | POA: Diagnosis not present

## 2020-01-10 ENCOUNTER — Ambulatory Visit: Payer: Medicare Other | Admitting: Cardiology

## 2020-01-10 ENCOUNTER — Other Ambulatory Visit: Payer: Self-pay

## 2020-01-10 ENCOUNTER — Ambulatory Visit (INDEPENDENT_AMBULATORY_CARE_PROVIDER_SITE_OTHER): Payer: Medicare Other | Admitting: Cardiology

## 2020-01-10 ENCOUNTER — Encounter: Payer: Self-pay | Admitting: Cardiology

## 2020-01-10 VITALS — BP 138/68 | HR 60

## 2020-01-10 DIAGNOSIS — Z7182 Exercise counseling: Secondary | ICD-10-CM

## 2020-01-10 DIAGNOSIS — R931 Abnormal findings on diagnostic imaging of heart and coronary circulation: Secondary | ICD-10-CM

## 2020-01-10 DIAGNOSIS — E782 Mixed hyperlipidemia: Secondary | ICD-10-CM

## 2020-01-10 DIAGNOSIS — I10 Essential (primary) hypertension: Secondary | ICD-10-CM | POA: Diagnosis not present

## 2020-01-10 DIAGNOSIS — Z7189 Other specified counseling: Secondary | ICD-10-CM | POA: Diagnosis not present

## 2020-01-10 DIAGNOSIS — Z713 Dietary counseling and surveillance: Secondary | ICD-10-CM | POA: Diagnosis not present

## 2020-01-10 DIAGNOSIS — Z8673 Personal history of transient ischemic attack (TIA), and cerebral infarction without residual deficits: Secondary | ICD-10-CM

## 2020-01-10 MED ORDER — AMLODIPINE BESYLATE 2.5 MG PO TABS: 2.5000 mg | ORAL_TABLET | Freq: Every day | ORAL | 11 refills | Status: DC

## 2020-01-10 NOTE — Progress Notes (Signed)
Cardiology Office Note:    Date:  01/10/2020   ID:  Sharon Marsh, DOB 11-Aug-1945, MRN [id number]  PCP:  Lowella Dandy, NP  Cardiologist:  Buford Dresser, MD  Referring MD: Lowella Dandy, NP   CC: establish care/second opinion  History of Present Illness:    Sharon Marsh is a 75 y.o. female with a hx of hypertension, dyslipidemia, obesity, elevated coronary calcium score who is seen as a new consult at the request of Moon, Amy A, NP for the evaluation and management of chest pain. She was previously followed by Dr. Haydee Salter, last visit 10/08/19.  Note from [facility] from 12/17/19 reviewed. Main concern at that visit was ear pain. There is mention of chest pain, awaiting stress test.  Her main concer is that she was told she had two mini-strokes in 10/2019. She was at a concert, noted leg/arm numbness, called EMS. She was told at [facility] that she had these. She has many questions related to this. Had head CT without intracranial abnormality. 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 Saw a neurologist (Dr. Jaynee Eagles), told that her MRI was not significantly different per the patient. I reviewed Dr. Cathren Laine note from 11/07/19. Recommended to control her risk factors. I do not have access to [place] records.  Has not tolerated statins in the past (not sure which ones she has tried). Knows she tried atorvastatin, had severe myalgias. Now trying rosuvastatin, tolerating twice a week 5 mg dose. Working to increase to 3 times/week. Taking baby aspirin, no issues with bleeding.  Hasn't had any chest pain as long as she takes her prilosec. If she misses the prilosec, she has very severe, sharp pain in her central chest.   She reports not being told anything about her calcium score. We discussed her results, pathology of cholesterol, risk reduction with statins. She has been ordered for both CT cardiac and myoview study for further evaluation. We discussed this, after  shared decision making will not proceed with this at this time.  She has been suggested to try [id number] injections. She is very nervous about this given her history of reactions. It was also expensive for her when she had her income/plan reviewed.   We discussed diet and exercise recommendations today. Discussed mediterranean diet, and she has elliptical at home.  She has a BP cuff at home. Her systolic numbers have ranged widely, from 120-150/70s. Has never been on anything other than metoprolol. No significant fatigue on this.   Denies chest pain beyond that stated above, shortness of breath at rest or with normal exertion. No PND, orthopnea, LE edema or unexpected weight gain. No syncope or palpitations.  ROS positive for foot neuropathy/numbness in her toes and top of her feet to ankles, has been progressive over the last several months. Has also had intermittent episodes of hand numbness. This is helped by lorazepam.   Past Medical History:  Diagnosis Date  . Anxiety state, unspecified 08/15/2013  . Bulging lumbar disc   . Cardiac murmur 07/02/2019  . Chest discomfort 07/02/2019  . GERD (gastroesophageal reflux disease)   . Hiatal hernia   . Hyperlipidemia 08/15/2013  . Hypertension   . Insomnia   . Lumbar spondylolysis   . 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 Obstructive sleep apnea   . Osteoarthritis of left knee 08/09/2017  . Osteoarthritis of right knee 10/03/2018  . Osteoporosis 02/12/2019  . Other and unspecified hyperlipidemia   . Personal history of vulvar dysplasia   .  PONV (postoperative nausea and vomiting)   . Unspecified essential hypertension 08/15/2013  . Unspecified hereditary and idiopathic peripheral neuropathy 08/15/2013    Past Surgical History:  Procedure Laterality Date  . ABDOMINAL HYSTERECTOMY    . APPENDECTOMY  1950  . CATARACT EXTRACTION Bilateral 2008  . KNEE SURGERY Left 2018   Arthroscopic knee surg for torn meniscus  . THYROID SURGERY  1980  . VULVECTOMY  N/A 06/22/2012   Procedure: WIDE EXCISION VULVECTOMY;  Surgeon: Allyn Kenner, DO;  Location: [facility];  Service: Gynecology;  Laterality: N/A;  wide local excision of vulva  . 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 Acetylcysteine (NAC) 600 MG CAPS Take 600 mg by mouth in the morning and at bedtime.   . Ascorbic Acid 1000 MG CHEW Chew 1,000 mg by mouth daily.  Marland Kitchen aspirin EC 81 MG tablet Take 1 tablet (81 mg total) by mouth daily. Swallow whole.  . b complex vitamins tablet Take 1 tablet by mouth daily.  . cholecalciferol (VITAMIN D3) 25 MCG (1000 UNIT) tablet Take 1,000 Units by mouth daily.  . Coenzyme Q10 (HM COQ-10) 200 MG capsule Take 1 capsule (200 mg total) by mouth daily.  Marland Kitchen ibuprofen (ADVIL) 800 MG tablet Take 800 mg by mouth every 6 (six) hours as needed.  Marland Kitchen LORazepam (ATIVAN) 0.5 MG tablet Take 0.5 mg by mouth every 8 (eight) hours as needed for anxiety.   . metoprolol (LOPRESSOR) 50 MG tablet Take 50 mg by mouth 2 (two) times daily. Take 1/2 tablet two times daily  . omeprazole (PRILOSEC) 20 MG capsule Take 20 mg by mouth daily.  . rosuvastatin (CRESTOR) 5 MG tablet Take 5 mg by mouth 2 (two) times a week.  . traMADol (ULTRAM) 50 MG tablet Take 50 mg by mouth every 4 (four) hours as needed.  . zolpidem (AMBIEN) 10 MG tablet Take 10 mg by mouth at bedtime as needed for sleep.   No current facility-administered medications on file prior to visit.     Allergies:   Clindamycin, Clindamycin/lincomycin, Doxycycline, and Latex   Social History   Tobacco Use  . Smoking status: Former Research scientist (life sciences)  . Smokeless tobacco: Never Used  Vaping Use  . Vaping Use: Never used  Substance Use Topics  . Alcohol use: Yes    Comment: socially "very little"  . Drug use: No    Family History: family history includes Heart attack in her father; Heart disease in her mother;  Hypercholesterolemia in her father; Hypertension in her father and mother. There is no history of Migraines, Stroke, or Transient ischemic attack.  ROS:   Please see the history of present illness.  Additional pertinent ROS: Constitutional: Negative for chills, fever, night sweats, unintentional weight loss  HENT: Negative for ear pain and hearing loss.   Eyes: Negative for loss of vision and eye pain.  Respiratory: Negative for cough, sputum, wheezing.   Cardiovascular: See HPI. Gastrointestinal: Negative for abdominal pain, melena, and hematochezia.  Genitourinary: Negative for dysuria and hematuria.  Musculoskeletal: Negative for falls and myalgias.  Skin: Negative for itching and rash.  Neurological: Negative for loss of consciousness. See above re: leg/arm numbness Endo/Heme/Allergies: Does not bruise/bleed easily.     EKGs/Labs/Other Studies Reviewed:    The following studies were reviewed today: CT ca score 10/01/19 Coronary calcium score of 226 . This was 74 percentile  for age and sex matched control.  EKG:  EKG is personally reviewed.  The ekg ordered today demonstrates NSR at 60 bpm  Recent Labs: 10/08/2019: TSH 3.300 10/29/2019: ALT 19; BUN 26; Creatinine, Ser 1.00; Hemoglobin 13.3; Platelets 271; Potassium 3.8; Sodium 139  Recent Lipid Panel No results found for: CHOL, TRIG, HDL, CHOLHDL, VLDL, LDLCALC, LDLDIRECT  Physical Exam:    VS:  BP 138/68   Pulse 60   SpO2 97%     Wt Readings from Last 3 Encounters:  11/07/19 172 lb (78 kg)  10/29/19 187 lb 6.3 oz (85 kg)  10/08/19 176 lb 3.2 oz (79.9 kg)    GEN: Well nourished, well developed in no acute distress HEENT: Normal, moist mucous membranes NECK: No JVD CARDIAC: regular rhythm, normal S1 and S2, no rubs or gallops. No murmur. VASCULAR: Radial and DP pulses 2+ bilaterally. No carotid bruits RESPIRATORY:  Clear to auscultation without rales, wheezing or rhonchi  ABDOMEN: Soft, non-tender,  non-distended MUSCULOSKELETAL:  Ambulates independently SKIN: Warm and dry, no edema NEUROLOGIC:  Alert and oriented x 3. No focal neuro deficits noted. PSYCHIATRIC:  Normal affect    ASSESSMENT:    1. Elevated coronary artery calcium score   2. History of transient ischemic attack (TIA)   3. Essential hypertension   4. Mixed hyperlipidemia   5. Cardiac risk counseling   6. Counseling on health promotion and disease prevention   7. Nutritional counseling   8. Exercise counseling    PLAN:    Elevated coronary calcium score, suggestive of CAD History of TIA Mixed hyperlipidemia -discussed pathology of cholesterol, how plaques form, that MI/CVA result commonly from acute plaque rupture and not gradual stenosis. Discussed mechanism of statin to both decrease plaque accumulation and stabilize plaque that is already present. Discussed that calcium is a marker for plaque, with decades of validated data regarding average amounts of calcium for age/gender/ethnicity, as well as value of calcium score for risk stratification -We reviewed the calcium score at length, including actual images as well as the graph showing mortality based on calcium score. We discussed the pathophysiology of cholesterol plaque formation, the role of calcium and why it is a marker, how plaque is key to acute MI/CVA, and how known plaque is managed with medications.  -we discussed the data on statins, both in terms of their long term benefit as well as the risk of side effects. Reviewed common misconceptions about statins. Reviewed how we monitor treatment.  After discussion, she will try to gradually ramp up the rosuvastatin dose and will continue on aspirin 81 mg daily.  Hypertension -goal <130/80 -has only ever been on metoprolol for >10 years -we will start with very low dose amlodipine to see if she tolerates it. If she does, will uptitrate amlodipine and wean off metoprolol -instructed on how to check home  BP -discussed most common side effects with amlodipine  Cardiac risk counseling and prevention recommendations: -recommend heart healthy/Mediterranean diet, with whole grains, fruits, vegetable, fish, lean meats, nuts, and olive oil. Limit salt. -recommend moderate walking, 3-5 times/week for 30-50 minutes each session. Aim for at least 150 minutes.week. Goal should be pace of 3 miles/hours, or walking 1.5 miles in 30 minutes -recommend avoidance of tobacco products. Avoid excess alcohol.  Plan for follow up: 1 month virtual to review blood pressure and response to amlodipine  Total time of encounter: 56 minutes total time of encounter, including 49 minutes spent in face-to-face patient care. This time includes coordination of  care and counseling regarding the above. Remainder of non-face-to-face time involved reviewing chart documents/testing relevant to the patient encounter and documentation in the medical record.  Buford Dresser, MD, PhD, [facility]   Medication Adjustments/Labs and Tests Ordered: Current medicines are reviewed at length with the patient today.  Concerns regarding medicines are outlined above.  Orders Placed This Encounter  Procedures  . EKG 12-Lead   Meds ordered this encounter  Medications  . amLODipine (NORVASC) 2.5 MG tablet    Sig: Take 1 tablet (2.5 mg total) by mouth daily.    Dispense:  30 tablet    Refill:  11    Patient Instructions  Medication Instructions:  Start Amlodipine 2.5 mg daily  *If you need a refill on your cardiac medications before your next appointment, please call your pharmacy*   Lab Work: None   Testing/Procedures: None   Follow-Up: At [facility], you and your health needs are our priority.  As part of our continuing mission to provide you with exceptional heart care, we have created designated Provider Care Teams.  These Care Teams include your primary Cardiologist (physician) and  Advanced Practice Providers (APPs -  Physician Assistants and Nurse Practitioners) who all work together to provide you with the care you need, when you need it.  We recommend signing up for the patient portal called "MyChart".  Sign up information is provided on this After Visit Summary.  MyChart is used to connect with patients for Virtual Visits (Telemedicine).  Patients are able to view lab/test results, encounter notes, upcoming appointments, etc.  Non-urgent messages can be sent to your provider as well.   To learn more about what you can do with MyChart, go to [email or web address].    Your next appointment:   1 month(s)  The format for your next appointment:   Virtual Visit   Provider:   Buford Dresser, MD   Other Instructions -counseled on how to check blood pressure:  -sit comfortably in a chair, feet uncrossed and flat on floor, for 5-10 minutes  -arm ideally should rest at the level of the heart. However, arm should be relaxed and not tense (for example, do not hold the arm up unsupported)  -avoid exercise, caffeine, and tobacco for at least 30 minutes prior to BP reading  -don't take BP cuff reading over clothes (always place on skin directly)  -I prefer to know how well the medication is working, so I would like you to take your readings 1-2 hours after taking your blood pressure medication if possible   Signed, Buford Dresser, MD PhD 01/10/2020 10:46 AM    [place]

## 2020-01-10 NOTE — Patient Instructions (Addendum)
Medication Instructions:  Start Amlodipine 2.5 mg daily  *If you need a refill on your cardiac medications before your next appointment, please call your pharmacy*   Lab Work: None   Testing/Procedures: None   Follow-Up: At [facility], you and your health needs are our priority.  As part of our continuing mission to provide you with exceptional heart care, we have created designated Provider Care Teams.  These Care Teams include your primary Cardiologist (physician) and Advanced Practice Providers (APPs -  Physician Assistants and Nurse Practitioners) who all work together to provide you with the care you need, when you need it.  We recommend signing up for the patient portal called "MyChart".  Sign up information is provided on this After Visit Summary.  MyChart is used to connect with patients for Virtual Visits (Telemedicine).  Patients are able to view lab/test results, encounter notes, upcoming appointments, etc.  Non-urgent messages can be sent to your provider as well.   To learn more about what you can do with MyChart, go to [email or web address].    Your next appointment:   1 month(s)  The format for your next appointment:   Virtual Visit   Provider:   Buford Dresser, MD   Other Instructions -counseled on how to check blood pressure:  -sit comfortably in a chair, feet uncrossed and flat on floor, for 5-10 minutes  -arm ideally should rest at the level of the heart. However, arm should be relaxed and not tense (for example, do not hold the arm up unsupported)  -avoid exercise, caffeine, and tobacco for at least 30 minutes prior to BP reading  -don't take BP cuff reading over clothes (always place on skin directly)  -I prefer to know how well the medication is working, so I would like you to take your readings 1-2 hours after taking your blood pressure medication if possible

## 2020-01-22 DIAGNOSIS — M5136 Other intervertebral disc degeneration, lumbar region: Secondary | ICD-10-CM | POA: Diagnosis not present

## 2020-01-22 DIAGNOSIS — M9903 Segmental and somatic dysfunction of lumbar region: Secondary | ICD-10-CM | POA: Diagnosis not present

## 2020-01-29 DIAGNOSIS — M9903 Segmental and somatic dysfunction of lumbar region: Secondary | ICD-10-CM | POA: Diagnosis not present

## 2020-01-29 DIAGNOSIS — M5136 Other intervertebral disc degeneration, lumbar region: Secondary | ICD-10-CM | POA: Diagnosis not present

## 2020-01-30 DIAGNOSIS — I10 Essential (primary) hypertension: Secondary | ICD-10-CM | POA: Diagnosis not present

## 2020-02-03 DIAGNOSIS — M9903 Segmental and somatic dysfunction of lumbar region: Secondary | ICD-10-CM | POA: Diagnosis not present

## 2020-02-03 DIAGNOSIS — M5136 Other intervertebral disc degeneration, lumbar region: Secondary | ICD-10-CM | POA: Diagnosis not present

## 2020-02-05 DIAGNOSIS — M9903 Segmental and somatic dysfunction of lumbar region: Secondary | ICD-10-CM | POA: Diagnosis not present

## 2020-02-05 DIAGNOSIS — M5136 Other intervertebral disc degeneration, lumbar region: Secondary | ICD-10-CM | POA: Diagnosis not present

## 2020-02-06 ENCOUNTER — Telehealth: Payer: Medicare Other | Admitting: Cardiology

## 2020-02-12 ENCOUNTER — Other Ambulatory Visit: Payer: Self-pay

## 2020-02-12 ENCOUNTER — Encounter: Payer: Self-pay | Admitting: Cardiology

## 2020-02-12 ENCOUNTER — Ambulatory Visit (INDEPENDENT_AMBULATORY_CARE_PROVIDER_SITE_OTHER): Payer: Medicare Other | Admitting: Cardiology

## 2020-02-12 VITALS — BP 122/70 | HR 98 | Ht 63.0 in | Wt 172.6 lb

## 2020-02-12 DIAGNOSIS — I2510 Atherosclerotic heart disease of native coronary artery without angina pectoris: Secondary | ICD-10-CM | POA: Diagnosis not present

## 2020-02-12 DIAGNOSIS — I10 Essential (primary) hypertension: Secondary | ICD-10-CM

## 2020-02-12 DIAGNOSIS — Z7189 Other specified counseling: Secondary | ICD-10-CM | POA: Diagnosis not present

## 2020-02-12 DIAGNOSIS — E782 Mixed hyperlipidemia: Secondary | ICD-10-CM

## 2020-02-12 DIAGNOSIS — Z8673 Personal history of transient ischemic attack (TIA), and cerebral infarction without residual deficits: Secondary | ICD-10-CM

## 2020-02-12 NOTE — Progress Notes (Signed)
Cardiology Office Note:    Date:  02/12/2020   ID:  KORTLYNN POUST, DOB [birth year], MRN [id number]  PCP:  Lowella Dandy, NP  Cardiologist:  Buford Dresser, MD  Referring MD: Lowella Dandy, NP   CC: follow up  History of Present Illness:    Sharon Marsh is a 75 y.o. female with a hx of hypertension, dyslipidemia, obesity, elevated coronary calcium score who is seen for follow up today. I initially met her 01/10/20 as a new consult at the request of Moon, Amy A, NP for the evaluation and management of chest pain. She was previously followed by Dr. Haydee Salter, last visit 10/08/19.  Today: Started antibiotic for sinus issue, blood pressures have been better controlled since starting this. Face doesn't hurt any more since starting antibiotics. Didn't feel well on amlodipine, stopped taking. Felt like it was making her head spin, had nausea and palpitations. Did not have hypotension. Recommended to start olmesartan by her PCP but has not started this. Taking whole metoprolol pill twice a day.   Taking aspirin 81 mg daily, rosuvastatin twice a week. Waiting to increase to three times a week until she feels better.  Denies chest pain, shortness of breath at rest or with normal exertion. No PND, orthopnea, LE edema or unexpected weight gain. No syncope.  Past Medical History:  Diagnosis Date  . Anxiety state, unspecified 08/15/2013  . Bulging lumbar disc   . Cardiac murmur 07/02/2019  . Chest discomfort 07/02/2019  . GERD (gastroesophageal reflux disease)   . Hiatal hernia   . Hyperlipidemia 08/15/2013  . Hypertension   . Insomnia   . Lumbar spondylolysis   . 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 Obstructive sleep apnea   . Osteoarthritis of left knee 08/09/2017  . Osteoarthritis of right knee 10/03/2018  . Osteoporosis 02/12/2019  . Other and unspecified hyperlipidemia   . Personal history of vulvar dysplasia   . PONV (postoperative nausea and vomiting)   . Unspecified essential  hypertension 08/15/2013  . Unspecified hereditary and idiopathic peripheral neuropathy 08/15/2013    Past Surgical History:  Procedure Laterality Date  . ABDOMINAL HYSTERECTOMY    . APPENDECTOMY  1950  . CATARACT EXTRACTION Bilateral 2008  . KNEE SURGERY Left 2018   Arthroscopic knee surg for torn meniscus  . THYROID SURGERY  1980  . VULVECTOMY N/A 06/22/2012   Procedure: WIDE EXCISION VULVECTOMY;  Surgeon: Allyn Kenner, DO;  Location: [facility];  Service: Gynecology;  Laterality: N/A;  wide local excision of vulva  . 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 Acetylcysteine (NAC) 600 MG CAPS Take 600 mg by mouth in the morning and at bedtime.   Marland Kitchen amoxicillin (AMOXIL) 500 MG capsule Take 500 mg by mouth 3 (three) times daily.  . Ascorbic Acid 1000 MG CHEW Chew 1,000 mg by mouth daily.  Marland Kitchen aspirin EC 81 MG tablet Take 1 tablet (81 mg total) by mouth daily. Swallow whole.  . cholecalciferol (VITAMIN D3) 25 MCG (1000 UNIT) tablet Take 1,000 Units by mouth daily.  . Coenzyme Q10 (HM COQ-10) 200 MG capsule Take 1 capsule (200 mg total) by mouth daily.  Marland Kitchen ibuprofen (ADVIL) 800 MG tablet Take 800 mg by mouth every 6 (six) hours as needed.  Marland Kitchen LORazepam (ATIVAN) 0.5 MG tablet Take 0.5 mg by mouth every 8 (eight) hours as needed for  anxiety.   . metoprolol (LOPRESSOR) 50 MG tablet Take 50 mg by mouth 2 (two) times daily. Take 1/2 tablet two times daily  . omeprazole (PRILOSEC) 20 MG capsule Take 20 mg by mouth daily.  . rosuvastatin (CRESTOR) 5 MG tablet Take 5 mg by mouth 2 (two) times a week.  . zolpidem (AMBIEN) 10 MG tablet Take 10 mg by mouth at bedtime as needed for sleep.  Marland Kitchen b complex vitamins tablet Take 1 tablet by mouth daily. (Patient not taking: Reported on 02/12/2020)  . traMADol (ULTRAM) 50 MG tablet Take 50 mg by mouth every 4 (four) hours as needed. (Patient not taking:  Reported on 02/12/2020)   No current facility-administered medications on file prior to visit.     Allergies:   Clindamycin, Clindamycin/lincomycin, Doxycycline, and Latex   Social History   Tobacco Use  . Smoking status: Former Research scientist (life sciences)  . Smokeless tobacco: Never Used  Vaping Use  . Vaping Use: Never used  Substance Use Topics  . Alcohol use: Yes    Comment: socially "very little"  . Drug use: No    Family History: family history includes Heart attack in her father; Heart disease in her mother; Hypercholesterolemia in her father; Hypertension in her father and mother. There is no history of Migraines, Stroke, or Transient ischemic attack.  ROS:   Please see the history of present illness.  Additional pertinent ROS otherwise unremarkable   EKGs/Labs/Other Studies Reviewed:    The following studies were reviewed today: CT ca score 10/01/19 Coronary calcium score of 226 . This was 21 percentile for age and sex matched control.  EKG:  EKG is personally reviewed.  The ekg ordered 01/10/20 demonstrates NSR at 60 bpm  Recent Labs: 10/08/2019: TSH 3.300 10/29/2019: ALT 19; BUN 26; Creatinine, Ser 1.00; Hemoglobin 13.3; Platelets 271; Potassium 3.8; Sodium 139  Recent Lipid Panel No results found for: CHOL, TRIG, HDL, CHOLHDL, VLDL, LDLCALC, LDLDIRECT  Physical Exam:    VS:  BP 122/70 (BP Location: Left Arm, Patient Position: Sitting)   Pulse 98   Ht '5\' 3"'  (1.6 m)   Wt 172 lb 9.6 oz (78.3 kg)   SpO2 94%   BMI 30.57 kg/m     Wt Readings from Last 3 Encounters:  02/12/20 172 lb 9.6 oz (78.3 kg)  11/07/19 172 lb (78 kg)  10/29/19 187 lb 6.3 oz (85 kg)    GEN: Well nourished, well developed in no acute distress HEENT: Normal, moist mucous membranes NECK: No JVD CARDIAC: regular rhythm, normal S1 and S2, no rubs or gallops. No murmur. VASCULAR: Radial and DP pulses 2+ bilaterally. No carotid bruits RESPIRATORY:  Clear to auscultation without rales, wheezing or rhonchi   ABDOMEN: Soft, non-tender, non-distended MUSCULOSKELETAL:  Ambulates independently SKIN: Warm and dry, no edema NEUROLOGIC:  Alert and oriented x 3. No focal neuro deficits noted. PSYCHIATRIC:  Normal affect   ASSESSMENT:    1. Coronary artery calcification seen on CT scan   2. History of transient ischemic attack (TIA)   3. Mixed hyperlipidemia   4. Essential hypertension   5. Cardiac risk counseling   6. Counseling on health promotion and disease prevention    PLAN:    Elevated coronary calcium score, suggestive of CAD History of TIA Mixed hyperlipidemia -continue aspirin 81 mg daily -she is currently taking rosuvastatin 5 mg twice a week. We reviewed the actual images of her calcium score test as well as representative images and graphs of mortality. Also discussed recommendations  for statin -she will try to increase to 3x/week once she feels back to her baseline.  Hypertension -goal <130/80 -did not tolerate amlodipine. Returned to metoprolol with now well controlled BP -continue metoprolol  Cardiac risk counseling and prevention recommendations: -recommend heart healthy/Mediterranean diet, with whole grains, fruits, vegetable, fish, lean meats, nuts, and olive oil. Limit salt. -recommend moderate walking, 3-5 times/week for 30-50 minutes each session. Aim for at least 150 minutes.week. Goal should be pace of 3 miles/hours, or walking 1.5 miles in 30 minutes -recommend avoidance of tobacco products. Avoid excess alcohol.  Plan for follow up: 6 mos or sooner PRN  Buford Dresser, MD, PhD, [facility]   Medication Adjustments/Labs and Tests Ordered: Current medicines are reviewed at length with the patient today.  Concerns regarding medicines are outlined above.  No orders of the defined types were placed in this encounter.  No orders of the defined types were placed in this encounter.   Patient Instructions  Medication Instructions:   Your Physician recommend you continue on your current medication as directed.    *If you need a refill on your cardiac medications before your next appointment, please call your pharmacy*   Lab Work: None   Testing/Procedures: None   Follow-Up: At [facility], you and your health needs are our priority.  As part of our continuing mission to provide you with exceptional heart care, we have created designated Provider Care Teams.  These Care Teams include your primary Cardiologist (physician) and Advanced Practice Providers (APPs -  Physician Assistants and Nurse Practitioners) who all work together to provide you with the care you need, when you need it.  We recommend signing up for the patient portal called "MyChart".  Sign up information is provided on this After Visit Summary.  MyChart is used to connect with patients for Virtual Visits (Telemedicine).  Patients are able to view lab/test results, encounter notes, upcoming appointments, etc.  Non-urgent messages can be sent to your provider as well.   To learn more about what you can do with MyChart, go to [email or web address].    Your next appointment:   6 month(s)  The format for your next appointment:   In Person  Provider:   Buford Dresser, MD      Signed, Buford Dresser, MD PhD 02/12/2020 6:42 PM    [place]

## 2020-02-12 NOTE — Patient Instructions (Signed)

## 2020-02-17 DIAGNOSIS — M9903 Segmental and somatic dysfunction of lumbar region: Secondary | ICD-10-CM | POA: Diagnosis not present

## 2020-02-17 DIAGNOSIS — M5136 Other intervertebral disc degeneration, lumbar region: Secondary | ICD-10-CM | POA: Diagnosis not present

## 2020-02-18 DIAGNOSIS — Z124 Encounter for screening for malignant neoplasm of cervix: Secondary | ICD-10-CM | POA: Diagnosis not present

## 2020-02-18 DIAGNOSIS — Z1272 Encounter for screening for malignant neoplasm of vagina: Secondary | ICD-10-CM | POA: Diagnosis not present

## 2020-02-18 DIAGNOSIS — Z779 Other contact with and (suspected) exposures hazardous to health: Secondary | ICD-10-CM | POA: Diagnosis not present

## 2020-02-18 DIAGNOSIS — Z7689 Persons encountering health services in other specified circumstances: Secondary | ICD-10-CM | POA: Diagnosis not present

## 2020-02-18 DIAGNOSIS — Z6831 Body mass index (BMI) 31.0-31.9, adult: Secondary | ICD-10-CM | POA: Diagnosis not present

## 2020-02-18 DIAGNOSIS — Z1231 Encounter for screening mammogram for malignant neoplasm of breast: Secondary | ICD-10-CM | POA: Diagnosis not present

## 2020-02-24 DIAGNOSIS — M9903 Segmental and somatic dysfunction of lumbar region: Secondary | ICD-10-CM | POA: Diagnosis not present

## 2020-02-24 DIAGNOSIS — M5136 Other intervertebral disc degeneration, lumbar region: Secondary | ICD-10-CM | POA: Diagnosis not present

## 2020-03-02 DIAGNOSIS — M9903 Segmental and somatic dysfunction of lumbar region: Secondary | ICD-10-CM | POA: Diagnosis not present

## 2020-03-02 DIAGNOSIS — M5136 Other intervertebral disc degeneration, lumbar region: Secondary | ICD-10-CM | POA: Diagnosis not present

## 2020-03-11 DIAGNOSIS — M9903 Segmental and somatic dysfunction of lumbar region: Secondary | ICD-10-CM | POA: Diagnosis not present

## 2020-03-11 DIAGNOSIS — M5136 Other intervertebral disc degeneration, lumbar region: Secondary | ICD-10-CM | POA: Diagnosis not present

## 2020-03-19 DIAGNOSIS — M1712 Unilateral primary osteoarthritis, left knee: Secondary | ICD-10-CM | POA: Diagnosis not present

## 2020-03-24 DIAGNOSIS — M9903 Segmental and somatic dysfunction of lumbar region: Secondary | ICD-10-CM | POA: Diagnosis not present

## 2020-03-24 DIAGNOSIS — M5136 Other intervertebral disc degeneration, lumbar region: Secondary | ICD-10-CM | POA: Diagnosis not present

## 2020-03-26 DIAGNOSIS — G459 Transient cerebral ischemic attack, unspecified: Secondary | ICD-10-CM | POA: Diagnosis not present

## 2020-03-26 DIAGNOSIS — F411 Generalized anxiety disorder: Secondary | ICD-10-CM | POA: Diagnosis not present

## 2020-03-26 DIAGNOSIS — Z6829 Body mass index (BMI) 29.0-29.9, adult: Secondary | ICD-10-CM | POA: Diagnosis not present

## 2020-03-26 DIAGNOSIS — E785 Hyperlipidemia, unspecified: Secondary | ICD-10-CM | POA: Diagnosis not present

## 2020-03-26 DIAGNOSIS — I10 Essential (primary) hypertension: Secondary | ICD-10-CM | POA: Diagnosis not present

## 2020-03-31 DIAGNOSIS — M5136 Other intervertebral disc degeneration, lumbar region: Secondary | ICD-10-CM | POA: Diagnosis not present

## 2020-03-31 DIAGNOSIS — M9903 Segmental and somatic dysfunction of lumbar region: Secondary | ICD-10-CM | POA: Diagnosis not present

## 2020-04-08 DIAGNOSIS — M5136 Other intervertebral disc degeneration, lumbar region: Secondary | ICD-10-CM | POA: Diagnosis not present

## 2020-04-08 DIAGNOSIS — M9903 Segmental and somatic dysfunction of lumbar region: Secondary | ICD-10-CM | POA: Diagnosis not present

## 2020-04-23 DIAGNOSIS — M9903 Segmental and somatic dysfunction of lumbar region: Secondary | ICD-10-CM | POA: Diagnosis not present

## 2020-04-23 DIAGNOSIS — M5136 Other intervertebral disc degeneration, lumbar region: Secondary | ICD-10-CM | POA: Diagnosis not present

## 2020-05-05 DIAGNOSIS — M5136 Other intervertebral disc degeneration, lumbar region: Secondary | ICD-10-CM | POA: Diagnosis not present

## 2020-05-05 DIAGNOSIS — M9903 Segmental and somatic dysfunction of lumbar region: Secondary | ICD-10-CM | POA: Diagnosis not present

## 2020-05-07 ENCOUNTER — Ambulatory Visit: Payer: Medicare Other | Admitting: Adult Health

## 2020-05-14 DIAGNOSIS — Z20822 Contact with and (suspected) exposure to covid-19: Secondary | ICD-10-CM | POA: Diagnosis not present

## 2020-05-21 DIAGNOSIS — M9903 Segmental and somatic dysfunction of lumbar region: Secondary | ICD-10-CM | POA: Diagnosis not present

## 2020-05-21 DIAGNOSIS — M5136 Other intervertebral disc degeneration, lumbar region: Secondary | ICD-10-CM | POA: Diagnosis not present

## 2020-05-28 DIAGNOSIS — M5136 Other intervertebral disc degeneration, lumbar region: Secondary | ICD-10-CM | POA: Diagnosis not present

## 2020-05-28 DIAGNOSIS — M9903 Segmental and somatic dysfunction of lumbar region: Secondary | ICD-10-CM | POA: Diagnosis not present

## 2020-06-08 DIAGNOSIS — J309 Allergic rhinitis, unspecified: Secondary | ICD-10-CM | POA: Diagnosis not present

## 2020-06-08 DIAGNOSIS — M9903 Segmental and somatic dysfunction of lumbar region: Secondary | ICD-10-CM | POA: Diagnosis not present

## 2020-06-08 DIAGNOSIS — M5136 Other intervertebral disc degeneration, lumbar region: Secondary | ICD-10-CM | POA: Diagnosis not present

## 2020-06-08 DIAGNOSIS — F411 Generalized anxiety disorder: Secondary | ICD-10-CM | POA: Diagnosis not present

## 2020-06-08 DIAGNOSIS — R002 Palpitations: Secondary | ICD-10-CM | POA: Diagnosis not present

## 2020-06-09 DIAGNOSIS — Z1331 Encounter for screening for depression: Secondary | ICD-10-CM | POA: Diagnosis not present

## 2020-06-09 DIAGNOSIS — Z0000 Encounter for general adult medical examination without abnormal findings: Secondary | ICD-10-CM | POA: Diagnosis not present

## 2020-06-09 DIAGNOSIS — Z9181 History of falling: Secondary | ICD-10-CM | POA: Diagnosis not present

## 2020-06-09 DIAGNOSIS — E785 Hyperlipidemia, unspecified: Secondary | ICD-10-CM | POA: Diagnosis not present

## 2020-06-10 DIAGNOSIS — M5136 Other intervertebral disc degeneration, lumbar region: Secondary | ICD-10-CM | POA: Diagnosis not present

## 2020-06-10 DIAGNOSIS — M9903 Segmental and somatic dysfunction of lumbar region: Secondary | ICD-10-CM | POA: Diagnosis not present

## 2020-06-16 DIAGNOSIS — L812 Freckles: Secondary | ICD-10-CM | POA: Diagnosis not present

## 2020-06-16 DIAGNOSIS — L820 Inflamed seborrheic keratosis: Secondary | ICD-10-CM | POA: Diagnosis not present

## 2020-06-16 DIAGNOSIS — D692 Other nonthrombocytopenic purpura: Secondary | ICD-10-CM | POA: Diagnosis not present

## 2020-06-16 DIAGNOSIS — D1801 Hemangioma of skin and subcutaneous tissue: Secondary | ICD-10-CM | POA: Diagnosis not present

## 2020-06-16 DIAGNOSIS — L570 Actinic keratosis: Secondary | ICD-10-CM | POA: Diagnosis not present

## 2020-06-16 DIAGNOSIS — L821 Other seborrheic keratosis: Secondary | ICD-10-CM | POA: Diagnosis not present

## 2020-06-16 DIAGNOSIS — Z85828 Personal history of other malignant neoplasm of skin: Secondary | ICD-10-CM | POA: Diagnosis not present

## 2020-06-19 ENCOUNTER — Other Ambulatory Visit: Payer: Self-pay

## 2020-06-19 ENCOUNTER — Encounter: Payer: Self-pay | Admitting: Physician Assistant

## 2020-06-19 ENCOUNTER — Ambulatory Visit (INDEPENDENT_AMBULATORY_CARE_PROVIDER_SITE_OTHER): Payer: Medicare Other | Admitting: Physician Assistant

## 2020-06-19 VITALS — BP 150/78 | HR 65 | Ht 63.0 in | Wt 171.6 lb

## 2020-06-19 DIAGNOSIS — R002 Palpitations: Secondary | ICD-10-CM

## 2020-06-19 DIAGNOSIS — I10 Essential (primary) hypertension: Secondary | ICD-10-CM

## 2020-06-19 DIAGNOSIS — I2510 Atherosclerotic heart disease of native coronary artery without angina pectoris: Secondary | ICD-10-CM | POA: Diagnosis not present

## 2020-06-19 NOTE — Progress Notes (Signed)
Cardiology Office Note:    Date:  06/21/2020   ID:  Sharon Marsh, DOB 11-20-1945, MRN [id number]  PCP:  Lowella Dandy, NP   [facility] Providers Cardiologist:  Buford Dresser, MD     Referring MD: Lowella Dandy, NP   Chief Complaint  Patient presents with   Follow-up    Seen for Dr. Harrell Gave    History of Present Illness:    Sharon Marsh is a 75 y.o. female with a hx of hypertension, hyperlipidemia, TIA, elevated coronary calcium score and obesity.  She is intolerant of amlodipine due to side effect of nausea and palpitation.  Previous echocardiogram obtained on 07/30/2019 showed EF 60 to 65%, grade 1 DD, normal pulmonary artery systolic pressure, mild MR, mild AI.  She underwent a coronary calcium score 09/23/2019 that showed coronary calcium score of 226 which placed the patient in the 74th percentile for age and sex matched control.  Patient was last seen by Dr. Harrell Gave on 02/12/2020 at which time she was doing well.  Patient presents today for evaluation of palpitation.  She typically noticed the palpitation after waking up in the morning.  She denies any exertional chest pain or worsening dyspnea.  She has no lower extremity edema, orthopnea or PND.  I recommended increase metoprolol to 25 mg a.m. and to 50 mg p.m. to help with palpitation.  I will bring the patient back in a few weeks for reassessment.  If palpitation still persist, will consider a heart monitor.  Otherwise her EKG today demonstrated sinus rhythm.  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   Obstructive sleep apnea    Osteoarthritis of left knee 08/09/2017   Osteoarthritis of right knee 10/03/2018    Osteoporosis 02/12/2019   Other and unspecified hyperlipidemia    Personal history of vulvar dysplasia    PONV (postoperative nausea and vomiting)    Unspecified essential hypertension 08/15/2013   Unspecified hereditary and idiopathic peripheral neuropathy 08/15/2013    Past Surgical History:  Procedure Laterality Date   ABDOMINAL HYSTERECTOMY     APPENDECTOMY  1950   CATARACT EXTRACTION Bilateral 2008   KNEE SURGERY Left 2018   Arthroscopic knee surg for torn meniscus   THYROID SURGERY  1980   VULVECTOMY N/A 06/22/2012   Procedure: WIDE EXCISION VULVECTOMY;  Surgeon: Allyn Kenner, DO;  Location: [facility];  Service: Gynecology;  Laterality: N/A;  wide local excision of vulva   wide local excision high grade VIN  06/22/2012   Dr Rogue Bussing    Current Medications: Current Meds  Medication Sig   Ascorbic Acid 1000 MG CHEW Chew 1,000 mg by mouth daily.   aspirin EC 81 MG tablet Take 1 tablet (81 mg total) by mouth daily. Swallow whole.   b complex vitamins tablet Take 1 tablet by mouth daily.   cholecalciferol (VITAMIN D3) 25 MCG (1000 UNIT) tablet Take 1,000 Units by mouth daily.   Coenzyme Q10 (HM COQ-10) 200 MG capsule Take 1 capsule (200 mg total) by mouth daily.   ibuprofen (ADVIL) 800 MG tablet Take 800 mg by mouth every 6 (six) hours as needed.   LORazepam (ATIVAN) 0.5 MG tablet  Take 0.5 mg by mouth every 8 (eight) hours as needed for anxiety.    metoprolol tartrate (LOPRESSOR) 25 MG tablet Take 75 mg by mouth 2 (two) times daily. Take 1  25 mg Tablet in the Morning. Take 2 Tablets 50 mg in Evening   montelukast (SINGULAIR) 10 MG tablet Take 10 mg by mouth at bedtime.   omeprazole (PRILOSEC) 20 MG capsule Take 20 mg by mouth daily.   zolpidem (AMBIEN) 10 MG tablet Take 10 mg by mouth at bedtime as needed for sleep.   [DISCONTINUED] metoprolol (LOPRESSOR) 50 MG tablet Take 50 mg by mouth 2 (two) times daily. Take 1/2 tablet two times daily     Allergies:   Clindamycin,  Clindamycin/lincomycin, Doxycycline, and Latex   Social History   Socioeconomic History   Marital status: Widowed    Spouse name: Not on file   Number of children: 2   Years of education: college   Highest education level: Not on file  Occupational History   Not on file  Tobacco Use   Smoking status: Former    Pack years: 0.00   Smokeless tobacco: Never  Vaping Use   Vaping Use: Never used  Substance and Sexual Activity   Alcohol use: Yes    Comment: socially "very little"   Drug use: No   Sexual activity: Yes    Birth control/protection: None  Other Topics Concern   Not on file  Social History Narrative   Patient is widowed with 2 children (1 of them passed in 2012).   Patient is right handed.   Patient has some college.   Caffeine: stopped    Social Determinants of Radio broadcast assistant Strain: Not on file  Food Insecurity: Not on file  Transportation Needs: Not on file  Physical Activity: Not on file  Stress: Not on file  Social Connections: Not on file     Family History: The patient's family history includes Heart attack in her father; Heart disease in her mother; Hypercholesterolemia in her father; Hypertension in her father and mother. There is no history of Migraines, Stroke, or Transient ischemic attack.  ROS:   Please see the history of present illness.     All other systems reviewed and are negative.  EKGs/Labs/Other Studies Reviewed:    The following studies were reviewed today:  Echo 07/30/2019  1. Left ventricular ejection fraction, by estimation, is 60 to 65%. The  left ventricle has normal function. The left ventricle has no regional  wall motion abnormalities. There is mild concentric left ventricular  hypertrophy. Left ventricular diastolic  parameters are consistent with Grade I diastolic dysfunction (impaired  relaxation).   2. Right ventricular systolic function is normal. The right ventricular  size is normal. There is normal  pulmonary artery systolic pressure.   3. The mitral valve is degenerative. Mild mitral valve regurgitation. No  evidence of mitral stenosis.   4. The aortic valve is tricuspid. Aortic valve regurgitation is mild. No  aortic stenosis is present.   5. The inferior vena cava is normal in size with greater than 50%  respiratory variability, suggesting right atrial pressure of 3 mmHg.   EKG:  EKG is ordered today.  The ekg ordered today demonstrates normal sinus rhythm, no significant ST-T wave changes  Recent Labs: 10/08/2019: TSH 3.300 10/29/2019: ALT 19; BUN 26; Creatinine, Ser 1.00; Hemoglobin 13.3; Platelets 271; Potassium 3.8; Sodium 139  Recent Lipid Panel No results found for: CHOL, TRIG, HDL, CHOLHDL, VLDL,  LDLCALC, LDLDIRECT   Risk Assessment/Calculations:           Physical Exam:    VS:  BP (!) 150/78 (BP Location: Left Arm, Patient Position: Sitting)   Pulse 65   Ht 5\' 3"  (1.6 m)   Wt 171 lb 9.6 oz (77.8 kg)   SpO2 97%   BMI 30.40 kg/m     Wt Readings from Last 3 Encounters:  06/19/20 171 lb 9.6 oz (77.8 kg)  02/12/20 172 lb 9.6 oz (78.3 kg)  11/07/19 172 lb (78 kg)     GEN:  Well nourished, well developed in no acute distress HEENT: Normal NECK: No JVD; No carotid bruits LYMPHATICS: No lymphadenopathy CARDIAC: RRR, no murmurs, rubs, gallops RESPIRATORY:  Clear to auscultation without rales, wheezing or rhonchi  ABDOMEN: Soft, non-tender, non-distended MUSCULOSKELETAL:  No edema; No deformity  SKIN: Warm and dry NEUROLOGIC:  Alert and oriented x 3 PSYCHIATRIC:  Normal affect   ASSESSMENT:    1. Palpitations   2. Primary hypertension    PLAN:    In order of problems listed above:  Palpitations: She typically noticed palpitation at night and especially waking up in the morning.  Palpitation does not occur with physical activity during the day.  I recommended increase metoprolol to 25 mg a.m. and 50 mg p.m. I will bring the patient back in a few weeks for  reassessment, if palpitations still persist, will consider heart monitor.  Hypertension: See above, increase metoprolol dosage to help with palpitation.        Medication Adjustments/Labs and Tests Ordered: Current medicines are reviewed at length with the patient today.  Concerns regarding medicines are outlined above.  Orders Placed This Encounter  Procedures   EKG 12-Lead   No orders of the defined types were placed in this encounter.   Patient Instructions  Medication Instructions:  Increase Metoprolol 25 mg ( 1 Tablet 25 mg in the morning. Two (2)  25 mg Tablets in the Evening *If you need a refill on your cardiac medications before your next appointment, please call your pharmacy*   Lab Work: No Labs If you have labs (blood work) drawn today and your tests are completely normal, you will receive your results only by: [place] (if you have MyChart) OR A paper copy in the mail If you have any lab test that is abnormal or we need to change your treatment, we will call you to review the results.   Testing/Procedures: No    Follow-Up: At [facility], you and your health needs are our priority.  As part of our continuing mission to provide you with exceptional heart care, we have created designated Provider Care Teams.  These Care Teams include your primary Cardiologist (physician) and Advanced Practice Providers (APPs -  Physician Assistants and Nurse Practitioners) who all work together to provide you with the care you need, when you need it.  We recommend signing up for the patient portal called "MyChart".  Sign up information is provided on this After Visit Summary.  MyChart is used to connect with patients for Virtual Visits (Telemedicine).  Patients are able to view lab/test results, encounter notes, upcoming appointments, etc.  Non-urgent messages can be sent to your provider as well.   To learn more about what you can do with MyChart, go to  [email or web address].    Your next appointment:   August 04, 2020 9:20 AM  The format for your next appointment:   In Person  Provider:  Buford Dresser, MD   Other Instructions  Please look up Repatha which is a PCSK 9 inhibitor (injection every 2 weeks) and Bempdoic acid (oral medication), they are the alternative to the statins.    Hilbert Corrigan, [place]  06/21/2020 11:29 PM    [facility]

## 2020-06-19 NOTE — Patient Instructions (Signed)
Medication Instructions:  Increase Metoprolol 25 mg ( 1 Tablet 25 mg in the morning. Two (2)  25 mg Tablets in the Evening *If you need a refill on your cardiac medications before your next appointment, please call your pharmacy*   Lab Work: No Labs If you have labs (blood work) drawn today and your tests are completely normal, you will receive your results only by: [place] (if you have MyChart) OR A paper copy in the mail If you have any lab test that is abnormal or we need to change your treatment, we will call you to review the results.   Testing/Procedures: No    Follow-Up: At [facility], you and your health needs are our priority.  As part of our continuing mission to provide you with exceptional heart care, we have created designated Provider Care Teams.  These Care Teams include your primary Cardiologist (physician) and Advanced Practice Providers (APPs -  Physician Assistants and Nurse Practitioners) who all work together to provide you with the care you need, when you need it.  We recommend signing up for the patient portal called "MyChart".  Sign up information is provided on this After Visit Summary.  MyChart is used to connect with patients for Virtual Visits (Telemedicine).  Patients are able to view lab/test results, encounter notes, upcoming appointments, etc.  Non-urgent messages can be sent to your provider as well.   To learn more about what you can do with MyChart, go to [email or web address].    Your next appointment:   August 04, 2020 9:20 AM  The format for your next appointment:   In Person  Provider:   Buford Dresser, MD   Other Instructions  Please look up Ranger which is a PCSK 9 inhibitor (injection every 2 weeks) and Bempdoic acid (oral medication), they are the alternative to the statins.

## 2020-06-21 ENCOUNTER — Encounter: Payer: Self-pay | Admitting: Physician Assistant

## 2020-06-22 DIAGNOSIS — M5136 Other intervertebral disc degeneration, lumbar region: Secondary | ICD-10-CM | POA: Diagnosis not present

## 2020-06-22 DIAGNOSIS — M9903 Segmental and somatic dysfunction of lumbar region: Secondary | ICD-10-CM | POA: Diagnosis not present

## 2020-06-25 DIAGNOSIS — M1712 Unilateral primary osteoarthritis, left knee: Secondary | ICD-10-CM | POA: Diagnosis not present

## 2020-06-26 DIAGNOSIS — Z6830 Body mass index (BMI) 30.0-30.9, adult: Secondary | ICD-10-CM | POA: Diagnosis not present

## 2020-06-26 DIAGNOSIS — R002 Palpitations: Secondary | ICD-10-CM | POA: Diagnosis not present

## 2020-06-26 DIAGNOSIS — E7849 Other hyperlipidemia: Secondary | ICD-10-CM | POA: Diagnosis not present

## 2020-06-26 DIAGNOSIS — E785 Hyperlipidemia, unspecified: Secondary | ICD-10-CM | POA: Diagnosis not present

## 2020-06-26 DIAGNOSIS — F411 Generalized anxiety disorder: Secondary | ICD-10-CM | POA: Diagnosis not present

## 2020-06-26 DIAGNOSIS — Z8673 Personal history of transient ischemic attack (TIA), and cerebral infarction without residual deficits: Secondary | ICD-10-CM | POA: Diagnosis not present

## 2020-06-26 DIAGNOSIS — I10 Essential (primary) hypertension: Secondary | ICD-10-CM | POA: Diagnosis not present

## 2020-06-29 DIAGNOSIS — M5136 Other intervertebral disc degeneration, lumbar region: Secondary | ICD-10-CM | POA: Diagnosis not present

## 2020-06-29 DIAGNOSIS — M9903 Segmental and somatic dysfunction of lumbar region: Secondary | ICD-10-CM | POA: Diagnosis not present

## 2020-07-08 ENCOUNTER — Ambulatory Visit (INDEPENDENT_AMBULATORY_CARE_PROVIDER_SITE_OTHER): Payer: Medicare Other

## 2020-07-08 ENCOUNTER — Telehealth: Payer: Self-pay | Admitting: *Deleted

## 2020-07-08 DIAGNOSIS — R002 Palpitations: Secondary | ICD-10-CM

## 2020-07-08 DIAGNOSIS — M9903 Segmental and somatic dysfunction of lumbar region: Secondary | ICD-10-CM | POA: Diagnosis not present

## 2020-07-08 DIAGNOSIS — M5136 Other intervertebral disc degeneration, lumbar region: Secondary | ICD-10-CM | POA: Diagnosis not present

## 2020-07-08 DIAGNOSIS — M1712 Unilateral primary osteoarthritis, left knee: Secondary | ICD-10-CM | POA: Diagnosis not present

## 2020-07-08 NOTE — Telephone Encounter (Signed)
Patient came by office today. She stated she is still having palpitations. She stated when she saw [place] PA on 06/21/20 someone told her if she was still having  issues to come by the office and a monitor will be placed and a follow up.  RN reviewed and discuss with [place] PA.  Per verbal order -  14 day Zio Monitor for  palpations ordered. RN  spoke to patient . Instruction for Zio Monitor was verbally given and written instructions as well. Patient aware to continue taking current medications and to document any a sensation  while wearing the monitor. Patient is aware of follow up appointment with Dr Harrell Gave on Aug 04, 2020. Patient verbalized understanding.

## 2020-07-08 NOTE — Progress Notes (Unsigned)
Enrolled patient for a 14 day Zio XT Monitor to be mailed to patients home   Dr. Harrell Gave to read

## 2020-07-11 DIAGNOSIS — R002 Palpitations: Secondary | ICD-10-CM | POA: Diagnosis not present

## 2020-07-15 DIAGNOSIS — M1712 Unilateral primary osteoarthritis, left knee: Secondary | ICD-10-CM | POA: Diagnosis not present

## 2020-07-16 DIAGNOSIS — Z961 Presence of intraocular lens: Secondary | ICD-10-CM | POA: Diagnosis not present

## 2020-07-16 DIAGNOSIS — H26493 Other secondary cataract, bilateral: Secondary | ICD-10-CM | POA: Diagnosis not present

## 2020-07-16 DIAGNOSIS — H04123 Dry eye syndrome of bilateral lacrimal glands: Secondary | ICD-10-CM | POA: Diagnosis not present

## 2020-07-23 DIAGNOSIS — M5136 Other intervertebral disc degeneration, lumbar region: Secondary | ICD-10-CM | POA: Diagnosis not present

## 2020-07-23 DIAGNOSIS — M9903 Segmental and somatic dysfunction of lumbar region: Secondary | ICD-10-CM | POA: Diagnosis not present

## 2020-07-27 DIAGNOSIS — R002 Palpitations: Secondary | ICD-10-CM | POA: Diagnosis not present

## 2020-07-30 DIAGNOSIS — M9903 Segmental and somatic dysfunction of lumbar region: Secondary | ICD-10-CM | POA: Diagnosis not present

## 2020-07-30 DIAGNOSIS — M5136 Other intervertebral disc degeneration, lumbar region: Secondary | ICD-10-CM | POA: Diagnosis not present

## 2020-08-02 DIAGNOSIS — L03119 Cellulitis of unspecified part of limb: Secondary | ICD-10-CM | POA: Diagnosis not present

## 2020-08-03 NOTE — Progress Notes (Signed)
Cardiology Office Note:    Date:  08/05/2020   ID:  Sharon Marsh, DOB 1945/11/12, MRN [id number]  PCP:  Sharon Dandy, NP  Cardiologist:  Sharon Dresser, MD  Referring MD: Sharon Dandy, NP   CC: follow up  History of Present Illness:    Sharon Marsh is Marsh 75 y.o. female with Marsh hx of hypertension, dyslipidemia, obesity, elevated coronary calcium score who is seen for follow up today. I initially met her 01/10/20 as Marsh new consult at the request of Moon, Amy A, NP for the evaluation and management of chest pain. She was previously followed by Dr. Haydee Marsh, last visit 10/08/19.  Today: She was having worsening palpitations and saw Sharon Marsh. Her palpitations mostly resolved after discontinuing an allergy medication prescribed by her PCP. Repatha is still recommended by her PCP, and we discussed this option at length today. I encouraged her to consider it.  She continues to have intermittent palpitations, mostly triggered by being very active and possibly when she feels anxious as well. She notes she was busy the past few days, and had some episodes of palpitations. We discussed in detail the palpitation episodes revealed by her most recent monitor results. Sometimes when she eats, her palpitations seem to worsen. Also, she believes her hiatal hernia may have exacerbated her palpitations. She does have very minor pains in her central chest that she attributes to the hiatal hernia.  Of note, she has difficulty with right-sided myalgias possibly originating from nerves in her neck.  She denies any shortness of breath, headaches, lightheadedness, or syncope. Also has no lower extremity edema, orthopnea or PND.   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   Obstructive sleep apnea    Osteoarthritis of left knee 08/09/2017   Osteoarthritis of right knee 10/03/2018   Osteoporosis 02/12/2019   Other and unspecified hyperlipidemia    Personal history of vulvar dysplasia    PONV (postoperative nausea and vomiting)    Unspecified essential hypertension 08/15/2013   Unspecified hereditary and idiopathic peripheral neuropathy 08/15/2013    Past Surgical History:  Procedure Laterality Date   ABDOMINAL HYSTERECTOMY     APPENDECTOMY  1950   CATARACT EXTRACTION Bilateral 2008   KNEE SURGERY Left 2018   Arthroscopic knee surg for torn meniscus   THYROID SURGERY  1980   VULVECTOMY N/Marsh 06/22/2012   Procedure: WIDE EXCISION VULVECTOMY;  Surgeon: Sharon Kenner, DO;  Location: [facility];  Service: Gynecology;  Laterality: N/Marsh;  wide local excision of vulva   wide local excision high grade VIN  06/22/2012   Dr Sharon Marsh    Current Medications: Current Outpatient Medications on File Prior to Visit  Medication Sig   Ascorbic Acid 1000 MG CHEW Chew 1,000 mg by mouth daily.   aspirin EC 81 MG tablet Take 1 tablet (81 mg total) by mouth daily. Swallow whole.   b complex vitamins tablet Take 1 tablet by mouth daily.   cholecalciferol (VITAMIN D3) 25 MCG (1000 UNIT) tablet Take 1,000 Units by mouth daily.   Coenzyme Q10 (HM COQ-10) 200 MG capsule Take 1 capsule (200 mg total) by mouth daily.   ibuprofen (ADVIL)  800 MG tablet Take 800 mg by mouth every 6 (six) hours as needed.   ipratropium (ATROVENT) 0.06 % nasal spray Place into the nose as needed.   LORazepam (ATIVAN) 0.5 MG tablet Take 0.5 mg by mouth every 8 (eight) hours as needed for anxiety.    metoprolol tartrate (LOPRESSOR) 25 MG tablet Take 75 mg by mouth 2 (two) times daily. Take 1  25 mg Tablet in the Morning. Take 2 Tablets 50 mg in Evening   omeprazole (PRILOSEC) 20 MG capsule Take 20 mg by mouth daily.   zolpidem (AMBIEN) 10 MG tablet Take 10  mg by mouth at bedtime as needed for sleep.   No current facility-administered medications on file prior to visit.     Allergies:   Clindamycin, Clindamycin/lincomycin, Doxycycline, and Latex   Social History   Tobacco Use   Smoking status: Former   Smokeless tobacco: Never  Scientific laboratory technician Use: Never used  Substance Use Topics   Alcohol use: Yes    Comment: socially "very little"   Drug use: No    Family History: family history includes Heart attack in her father; Heart disease in her mother; Hypercholesterolemia in her father; Hypertension in her father and mother. There is no history of Migraines, Stroke, or Transient ischemic attack.  ROS:   Please see the history of present illness.   (+) Palpitations (+) Myalgias, right-sided (+) Minor central chest pains due to hiatal hernia Additional pertinent ROS otherwise unremarkable   EKGs/Labs/Other Studies Reviewed:    The following studies were reviewed today:  Monitor 07/27/2020: 12 days of data recorded on Zio monitor. Patient had Marsh min HR of 48 bpm, max HR of 193 bpm, and avg HR of 77 bpm. Predominant underlying rhythm was Sinus Rhythm. 8 Supraventricular Tachycardia runs occurred, the run with the fastest interval lasting 6 beats with Marsh max rate of 193 bpm, the longest lasting 12 beats with an avg rate of 147 bpm. No VT, atrial fibrillation, high degree block, or pauses noted. Isolated atrial and ventricular ectopy was rare (<1%). There were 22 triggered events, which were sinus. No significant arrhythmias detected.  CT ca score 10/01/19 Coronary calcium score of 226 . This was 35 percentile for age and sex matched control.  Echo 07/30/2019: 1. Left ventricular ejection fraction, by estimation, is 60 to 65%. The  left ventricle has normal function. The left ventricle has no regional  wall motion abnormalities. There is mild concentric left ventricular  hypertrophy. Left ventricular diastolic  parameters are  consistent with Grade I diastolic dysfunction (impaired  relaxation).   2. Right ventricular systolic function is normal. The right ventricular  size is normal. There is normal pulmonary artery systolic pressure.   3. The mitral valve is degenerative. Mild mitral valve regurgitation. No  evidence of mitral stenosis.   4. The aortic valve is tricuspid. Aortic valve regurgitation is mild. No  aortic stenosis is present.   5. The inferior vena cava is normal in size with greater than 50%  respiratory variability, suggesting right atrial pressure of 3 mmHg.   EKG:  EKG is personally reviewed.   08/04/2020: not ordered 02/12/2020: EKG was not ordered. 01/10/20: NSR at 60 bpm  Recent Labs: 10/08/2019: TSH 3.300 10/29/2019: ALT 19; BUN 26; Creatinine, Ser 1.00; Hemoglobin 13.3; Platelets 271; Potassium 3.8; Sodium 139  Recent Lipid Panel No results found for: CHOL, TRIG, HDL, CHOLHDL, VLDL, LDLCALC, LDLDIRECT  Physical Exam:    VS:  BP 122/80  Pulse 73   Ht '5\' 3"'  (1.6 m)   Wt 172 lb 6.4 oz (78.2 kg)   SpO2 98%   BMI 30.54 kg/m     Wt Readings from Last 3 Encounters:  08/04/20 172 lb 6.4 oz (78.2 kg)  06/19/20 171 lb 9.6 oz (77.8 kg)  02/12/20 172 lb 9.6 oz (78.3 kg)    GEN: Well nourished, well developed in no acute distress HEENT: Normal, moist mucous membranes NECK: No JVD CARDIAC: regular rhythm, normal S1 and S2, no rubs or gallops. No murmur. VASCULAR: Radial and DP pulses 2+ bilaterally. No carotid bruits RESPIRATORY:  Clear to auscultation without rales, wheezing or rhonchi  ABDOMEN: Soft, non-tender, non-distended MUSCULOSKELETAL:  Ambulates independently SKIN: Warm and dry, no edema NEUROLOGIC:  Alert and oriented x 3. No focal neuro deficits noted. PSYCHIATRIC:  Normal affect   ASSESSMENT:    1. Palpitations   2. Encounter to discuss test results   3. Primary hypertension   4. Coronary artery calcification seen on CT scan   5. Mixed hyperlipidemia   6. History of  transient ischemic attack (TIA)   7. Cardiac risk counseling   8. Counseling on health promotion and disease prevention     PLAN:    Palpitations Review of monitor test results -monitor reassuring, no significant arrhythmias, symptomatic events were sinus  Elevated coronary calcium score, suggestive of CAD History of TIA Mixed hyperlipidemia -continue aspirin 81 mg daily -she has been intolerant of multiple statins. Given CAD, elevated numbers, I agree with the recommendation for PCSK9i therapy. We discussed the mechanism of these medications at length today. She has been prescribed this by her PCP but has not taken yet. She will consider -instructed on red flag warning signs that need immediate medical attention  Hypertension -goal <130/80 -did not tolerate amlodipine. Returned to metoprolol with now well controlled BP -continue metoprolol  Cardiac risk counseling and prevention recommendations: -recommend heart healthy/Mediterranean diet, with whole grains, fruits, vegetable, fish, lean meats, nuts, and olive oil. Limit salt. -recommend moderate walking, 3-5 times/week for 30-50 minutes each session. Aim for at least 150 minutes.week. Goal should be pace of 3 miles/hours, or walking 1.5 miles in 30 minutes -recommend avoidance of tobacco products. Avoid excess alcohol.  Plan for follow up: 1 year or sooner PRN  Sharon Dresser, MD, PhD, [facility]   Medication Adjustments/Labs and Tests Ordered: Current medicines are reviewed at length with the patient today.  Concerns regarding medicines are outlined above.  No orders of the defined types were placed in this encounter.  No orders of the defined types were placed in this encounter.   Patient Instructions  Medication Instructions:  Your Physician recommend you continue on your current medication as directed.    *If you need Marsh refill on your cardiac medications before your next appointment,  please call your pharmacy*   Lab Work: None ordered today   Testing/Procedures: None ordered today   Follow-Up: At [facility], you and your health needs are our priority.  As part of our continuing mission to provide you with exceptional heart care, we have created designated Provider Care Teams.  These Care Teams include your primary Cardiologist (physician) and Advanced Practice Providers (APPs -  Physician Assistants and Nurse Practitioners) who all work together to provide you with the care you need, when you need it.  We recommend signing up for the patient portal called "MyChart".  Sign up information is provided on this After Visit  Summary.  MyChart is used to connect with patients for Virtual Visits (Telemedicine).  Patients are able to view lab/test results, encounter notes, upcoming appointments, etc.  Non-urgent messages can be sent to your provider as well.   To learn more about what you can do with MyChart, go to [email or web address].    Your next appointment:   1 year(s)  The format for your next appointment:   In Person  Provider:   Buford Dresser, MD     [facility],acting as Marsh scribe for Sharon Dresser, MD.,have documented all relevant documentation on the behalf of Sharon Dresser, MD,as directed by  Sharon Dresser, MD while in the presence of Sharon Dresser, MD.  I, Sharon Dresser, MD, have reviewed all documentation for this visit. The documentation on 08/05/20 for the exam, diagnosis, procedures, and orders are all accurate and complete.   Signed, Sharon Dresser, MD PhD 08/05/2020 12:12 PM    [place]

## 2020-08-04 ENCOUNTER — Ambulatory Visit (INDEPENDENT_AMBULATORY_CARE_PROVIDER_SITE_OTHER): Payer: Medicare Other | Admitting: Cardiology

## 2020-08-04 ENCOUNTER — Other Ambulatory Visit: Payer: Self-pay

## 2020-08-04 VITALS — BP 122/80 | HR 73 | Ht 63.0 in | Wt 172.4 lb

## 2020-08-04 DIAGNOSIS — Z8673 Personal history of transient ischemic attack (TIA), and cerebral infarction without residual deficits: Secondary | ICD-10-CM | POA: Diagnosis not present

## 2020-08-04 DIAGNOSIS — Z712 Person consulting for explanation of examination or test findings: Secondary | ICD-10-CM | POA: Diagnosis not present

## 2020-08-04 DIAGNOSIS — I2510 Atherosclerotic heart disease of native coronary artery without angina pectoris: Secondary | ICD-10-CM

## 2020-08-04 DIAGNOSIS — E782 Mixed hyperlipidemia: Secondary | ICD-10-CM

## 2020-08-04 DIAGNOSIS — I10 Essential (primary) hypertension: Secondary | ICD-10-CM | POA: Diagnosis not present

## 2020-08-04 DIAGNOSIS — Z7189 Other specified counseling: Secondary | ICD-10-CM | POA: Diagnosis not present

## 2020-08-04 DIAGNOSIS — R002 Palpitations: Secondary | ICD-10-CM

## 2020-08-04 NOTE — Patient Instructions (Signed)

## 2020-08-05 ENCOUNTER — Encounter (HOSPITAL_BASED_OUTPATIENT_CLINIC_OR_DEPARTMENT_OTHER): Payer: Self-pay | Admitting: Cardiology

## 2020-08-10 ENCOUNTER — Encounter (HOSPITAL_BASED_OUTPATIENT_CLINIC_OR_DEPARTMENT_OTHER): Payer: Self-pay

## 2020-08-11 DIAGNOSIS — M9903 Segmental and somatic dysfunction of lumbar region: Secondary | ICD-10-CM | POA: Diagnosis not present

## 2020-08-11 DIAGNOSIS — M5136 Other intervertebral disc degeneration, lumbar region: Secondary | ICD-10-CM | POA: Diagnosis not present

## 2020-08-24 DIAGNOSIS — M9903 Segmental and somatic dysfunction of lumbar region: Secondary | ICD-10-CM | POA: Diagnosis not present

## 2020-08-24 DIAGNOSIS — M5136 Other intervertebral disc degeneration, lumbar region: Secondary | ICD-10-CM | POA: Diagnosis not present

## 2020-08-26 DIAGNOSIS — M9903 Segmental and somatic dysfunction of lumbar region: Secondary | ICD-10-CM | POA: Diagnosis not present

## 2020-08-26 DIAGNOSIS — M5136 Other intervertebral disc degeneration, lumbar region: Secondary | ICD-10-CM | POA: Diagnosis not present

## 2020-09-03 DIAGNOSIS — M5136 Other intervertebral disc degeneration, lumbar region: Secondary | ICD-10-CM | POA: Diagnosis not present

## 2020-09-03 DIAGNOSIS — M9903 Segmental and somatic dysfunction of lumbar region: Secondary | ICD-10-CM | POA: Diagnosis not present

## 2020-09-16 DIAGNOSIS — M9903 Segmental and somatic dysfunction of lumbar region: Secondary | ICD-10-CM | POA: Diagnosis not present

## 2020-09-16 DIAGNOSIS — M5136 Other intervertebral disc degeneration, lumbar region: Secondary | ICD-10-CM | POA: Diagnosis not present

## 2020-09-28 DIAGNOSIS — M5136 Other intervertebral disc degeneration, lumbar region: Secondary | ICD-10-CM | POA: Diagnosis not present

## 2020-09-28 DIAGNOSIS — M9903 Segmental and somatic dysfunction of lumbar region: Secondary | ICD-10-CM | POA: Diagnosis not present

## 2020-10-01 DIAGNOSIS — M5136 Other intervertebral disc degeneration, lumbar region: Secondary | ICD-10-CM | POA: Diagnosis not present

## 2020-10-01 DIAGNOSIS — M9903 Segmental and somatic dysfunction of lumbar region: Secondary | ICD-10-CM | POA: Diagnosis not present

## 2020-10-08 DIAGNOSIS — M9903 Segmental and somatic dysfunction of lumbar region: Secondary | ICD-10-CM | POA: Diagnosis not present

## 2020-10-08 DIAGNOSIS — M5136 Other intervertebral disc degeneration, lumbar region: Secondary | ICD-10-CM | POA: Diagnosis not present

## 2020-10-15 DIAGNOSIS — M5136 Other intervertebral disc degeneration, lumbar region: Secondary | ICD-10-CM | POA: Diagnosis not present

## 2020-10-15 DIAGNOSIS — M9903 Segmental and somatic dysfunction of lumbar region: Secondary | ICD-10-CM | POA: Diagnosis not present

## 2020-10-21 DIAGNOSIS — M5136 Other intervertebral disc degeneration, lumbar region: Secondary | ICD-10-CM | POA: Diagnosis not present

## 2020-10-21 DIAGNOSIS — M9903 Segmental and somatic dysfunction of lumbar region: Secondary | ICD-10-CM | POA: Diagnosis not present

## 2020-10-30 DIAGNOSIS — E7849 Other hyperlipidemia: Secondary | ICD-10-CM | POA: Diagnosis not present

## 2020-10-30 DIAGNOSIS — I10 Essential (primary) hypertension: Secondary | ICD-10-CM | POA: Diagnosis not present

## 2020-10-30 DIAGNOSIS — G4700 Insomnia, unspecified: Secondary | ICD-10-CM | POA: Diagnosis not present

## 2020-10-30 DIAGNOSIS — Z8673 Personal history of transient ischemic attack (TIA), and cerebral infarction without residual deficits: Secondary | ICD-10-CM | POA: Diagnosis not present

## 2020-10-30 DIAGNOSIS — R002 Palpitations: Secondary | ICD-10-CM | POA: Diagnosis not present

## 2020-10-30 DIAGNOSIS — E785 Hyperlipidemia, unspecified: Secondary | ICD-10-CM | POA: Diagnosis not present

## 2020-10-30 DIAGNOSIS — Z6830 Body mass index (BMI) 30.0-30.9, adult: Secondary | ICD-10-CM | POA: Diagnosis not present

## 2020-10-30 DIAGNOSIS — F411 Generalized anxiety disorder: Secondary | ICD-10-CM | POA: Diagnosis not present

## 2020-11-16 DIAGNOSIS — J3489 Other specified disorders of nose and nasal sinuses: Secondary | ICD-10-CM | POA: Diagnosis not present

## 2020-11-16 DIAGNOSIS — M9903 Segmental and somatic dysfunction of lumbar region: Secondary | ICD-10-CM | POA: Diagnosis not present

## 2020-11-16 DIAGNOSIS — M5136 Other intervertebral disc degeneration, lumbar region: Secondary | ICD-10-CM | POA: Diagnosis not present

## 2020-11-23 DIAGNOSIS — M5136 Other intervertebral disc degeneration, lumbar region: Secondary | ICD-10-CM | POA: Diagnosis not present

## 2020-11-23 DIAGNOSIS — M9903 Segmental and somatic dysfunction of lumbar region: Secondary | ICD-10-CM | POA: Diagnosis not present

## 2020-12-02 DIAGNOSIS — M5136 Other intervertebral disc degeneration, lumbar region: Secondary | ICD-10-CM | POA: Diagnosis not present

## 2020-12-02 DIAGNOSIS — M9903 Segmental and somatic dysfunction of lumbar region: Secondary | ICD-10-CM | POA: Diagnosis not present

## 2020-12-22 DIAGNOSIS — J019 Acute sinusitis, unspecified: Secondary | ICD-10-CM | POA: Diagnosis not present

## 2020-12-22 DIAGNOSIS — Z20822 Contact with and (suspected) exposure to covid-19: Secondary | ICD-10-CM | POA: Diagnosis not present

## 2020-12-22 DIAGNOSIS — J209 Acute bronchitis, unspecified: Secondary | ICD-10-CM | POA: Diagnosis not present

## 2021-01-02 DIAGNOSIS — J209 Acute bronchitis, unspecified: Secondary | ICD-10-CM | POA: Diagnosis not present

## 2021-01-02 DIAGNOSIS — R059 Cough, unspecified: Secondary | ICD-10-CM | POA: Diagnosis not present

## 2021-01-02 DIAGNOSIS — K591 Functional diarrhea: Secondary | ICD-10-CM | POA: Diagnosis not present

## 2021-01-03 DIAGNOSIS — R197 Diarrhea, unspecified: Secondary | ICD-10-CM | POA: Diagnosis not present

## 2021-01-04 DIAGNOSIS — M5136 Other intervertebral disc degeneration, lumbar region: Secondary | ICD-10-CM | POA: Diagnosis not present

## 2021-01-04 DIAGNOSIS — M9903 Segmental and somatic dysfunction of lumbar region: Secondary | ICD-10-CM | POA: Diagnosis not present

## 2021-01-07 DIAGNOSIS — L570 Actinic keratosis: Secondary | ICD-10-CM | POA: Diagnosis not present

## 2021-01-07 DIAGNOSIS — Z85828 Personal history of other malignant neoplasm of skin: Secondary | ICD-10-CM | POA: Diagnosis not present

## 2021-01-07 DIAGNOSIS — L820 Inflamed seborrheic keratosis: Secondary | ICD-10-CM | POA: Diagnosis not present

## 2021-01-11 DIAGNOSIS — M9903 Segmental and somatic dysfunction of lumbar region: Secondary | ICD-10-CM | POA: Diagnosis not present

## 2021-01-11 DIAGNOSIS — M5136 Other intervertebral disc degeneration, lumbar region: Secondary | ICD-10-CM | POA: Diagnosis not present

## 2021-01-19 DIAGNOSIS — M1712 Unilateral primary osteoarthritis, left knee: Secondary | ICD-10-CM | POA: Diagnosis not present

## 2021-01-21 DIAGNOSIS — M5136 Other intervertebral disc degeneration, lumbar region: Secondary | ICD-10-CM | POA: Diagnosis not present

## 2021-01-21 DIAGNOSIS — M9903 Segmental and somatic dysfunction of lumbar region: Secondary | ICD-10-CM | POA: Diagnosis not present

## 2021-01-26 DIAGNOSIS — M1712 Unilateral primary osteoarthritis, left knee: Secondary | ICD-10-CM | POA: Diagnosis not present

## 2021-02-02 DIAGNOSIS — M5136 Other intervertebral disc degeneration, lumbar region: Secondary | ICD-10-CM | POA: Diagnosis not present

## 2021-02-02 DIAGNOSIS — M1712 Unilateral primary osteoarthritis, left knee: Secondary | ICD-10-CM | POA: Diagnosis not present

## 2021-02-02 DIAGNOSIS — M9903 Segmental and somatic dysfunction of lumbar region: Secondary | ICD-10-CM | POA: Diagnosis not present

## 2021-02-09 DIAGNOSIS — G4700 Insomnia, unspecified: Secondary | ICD-10-CM | POA: Diagnosis not present

## 2021-02-09 DIAGNOSIS — R002 Palpitations: Secondary | ICD-10-CM | POA: Diagnosis not present

## 2021-02-09 DIAGNOSIS — Z6829 Body mass index (BMI) 29.0-29.9, adult: Secondary | ICD-10-CM | POA: Diagnosis not present

## 2021-02-09 DIAGNOSIS — I10 Essential (primary) hypertension: Secondary | ICD-10-CM | POA: Diagnosis not present

## 2021-02-09 DIAGNOSIS — Z8673 Personal history of transient ischemic attack (TIA), and cerebral infarction without residual deficits: Secondary | ICD-10-CM | POA: Diagnosis not present

## 2021-02-09 DIAGNOSIS — E785 Hyperlipidemia, unspecified: Secondary | ICD-10-CM | POA: Diagnosis not present

## 2021-02-09 DIAGNOSIS — F411 Generalized anxiety disorder: Secondary | ICD-10-CM | POA: Diagnosis not present

## 2021-02-11 DIAGNOSIS — M9903 Segmental and somatic dysfunction of lumbar region: Secondary | ICD-10-CM | POA: Diagnosis not present

## 2021-02-11 DIAGNOSIS — M5136 Other intervertebral disc degeneration, lumbar region: Secondary | ICD-10-CM | POA: Diagnosis not present

## 2021-02-16 DIAGNOSIS — M9903 Segmental and somatic dysfunction of lumbar region: Secondary | ICD-10-CM | POA: Diagnosis not present

## 2021-02-16 DIAGNOSIS — M5136 Other intervertebral disc degeneration, lumbar region: Secondary | ICD-10-CM | POA: Diagnosis not present

## 2021-02-23 DIAGNOSIS — M9903 Segmental and somatic dysfunction of lumbar region: Secondary | ICD-10-CM | POA: Diagnosis not present

## 2021-02-23 DIAGNOSIS — M5136 Other intervertebral disc degeneration, lumbar region: Secondary | ICD-10-CM | POA: Diagnosis not present

## 2021-02-23 DIAGNOSIS — Z1231 Encounter for screening mammogram for malignant neoplasm of breast: Secondary | ICD-10-CM | POA: Diagnosis not present

## 2021-02-23 DIAGNOSIS — Z6830 Body mass index (BMI) 30.0-30.9, adult: Secondary | ICD-10-CM | POA: Diagnosis not present

## 2021-02-23 DIAGNOSIS — N762 Acute vulvitis: Secondary | ICD-10-CM | POA: Diagnosis not present

## 2021-02-23 DIAGNOSIS — Z124 Encounter for screening for malignant neoplasm of cervix: Secondary | ICD-10-CM | POA: Diagnosis not present

## 2021-02-28 ENCOUNTER — Other Ambulatory Visit: Payer: Self-pay

## 2021-02-28 ENCOUNTER — Encounter: Payer: Self-pay | Admitting: Emergency Medicine

## 2021-02-28 ENCOUNTER — Ambulatory Visit
Admission: EM | Admit: 2021-02-28 | Discharge: 2021-02-28 | Disposition: A | Discharge status: Home / Self Care | Payer: Medicare Other | Attending: Internal Medicine | Admitting: Internal Medicine

## 2021-02-28 DIAGNOSIS — R350 Frequency of micturition: Secondary | ICD-10-CM

## 2021-02-28 DIAGNOSIS — R300 Dysuria: Secondary | ICD-10-CM | POA: Diagnosis not present

## 2021-02-28 LAB — POCT URINALYSIS DIP (MANUAL ENTRY)
Bilirubin, UA: NEGATIVE
Blood, UA: NEGATIVE
Glucose, UA: NEGATIVE mg/dL
Ketones, POC UA: NEGATIVE mg/dL
Nitrite, UA: NEGATIVE
Spec Grav, UA: 1.02 (ref 1.010–1.025)
Urobilinogen, UA: 0.2 E.U./dL
pH, UA: 5.5 (ref 5.0–8.0)

## 2021-02-28 MED ORDER — FLUCONAZOLE 150 MG PO TABS: 150.0000 mg | ORAL_TABLET | Freq: Every day | ORAL | 0 refills | Status: DC

## 2021-02-28 NOTE — ED Triage Notes (Signed)
Patient c/o urinary urgency, dysuria and frequency x 1 day.  Patient denies any hematuria.  Patient did take Cephalexin x 2 to help with the discomfort.  Patient is also taken Amoxil for dental work.

## 2021-02-28 NOTE — Discharge Instructions (Signed)
It is suspected that you could have a yeast infection given recent antibiotic use.  You have been prescribed Diflucan which will help treat this.  Urine culture and vaginal swab are pending.  We will call if they are positive.

## 2021-02-28 NOTE — ED Provider Notes (Signed)
[facility]    CSN: [id number] Arrival date & time: 02/28/21  1251      History   Chief Complaint Chief Complaint  Patient presents with   Urinary Urgency    HPI Sharon Marsh is a 76 y.o. female.   Patient presents with urinary urgency, urinary burning, urinary frequency that started today.  Patient denies hematuria, vaginal discharge, pelvic pain, abdominal pain, fever, back pain.  Patient reports that she saw her gynecologist a few days ago and was told that she had yeast infection of the skin and prescribed a cream that she has not started taking yet.  Patient is currently taking amoxicillin due to recent dental work as well.    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   Obstructive sleep apnea    Osteoarthritis of left knee 08/09/2017   Osteoarthritis of right knee 10/03/2018   Osteoporosis 02/12/2019   Other and unspecified hyperlipidemia    Personal history of vulvar dysplasia    PONV (postoperative nausea and vomiting)    Unspecified essential hypertension 08/15/2013   Unspecified hereditary and idiopathic peripheral neuropathy 08/15/2013    Patient Active Problem List   Diagnosis Date Noted   History of transient ischemic attack (TIA) 01/10/2020   Elevated coronary artery calcium score 10/08/2019   Essential hypertension 10/08/2019   Mixed dyslipidemia 10/08/2019   Obesity (BMI 30.0-34.9) 10/08/2019   Chest discomfort 07/02/2019   Cardiac murmur 07/02/2019   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   PONV (postoperative nausea and vomiting)    Personal history of vulvar dysplasia    Obstructive sleep apnea    Insomnia    Hypertension    Hiatal hernia    GERD  (gastroesophageal reflux disease)    Bulging lumbar disc    Osteoporosis 02/12/2019   Osteoarthritis of right knee 10/03/2018   Osteoarthritis of left knee 08/09/2017   Anxiety state, unspecified 08/15/2013   Unspecified hereditary and idiopathic peripheral neuropathy 08/15/2013   Unspecified essential hypertension 08/15/2013   Other and unspecified hyperlipidemia 08/15/2013   Hyperlipidemia 08/15/2013    Past Surgical History:  Procedure Laterality Date   ABDOMINAL HYSTERECTOMY     APPENDECTOMY  1950   CATARACT EXTRACTION Bilateral 2008   KNEE SURGERY Left 2018   Arthroscopic knee surg for torn meniscus   THYROID SURGERY  1980   VULVECTOMY N/A 06/22/2012   Procedure: WIDE EXCISION VULVECTOMY;  Surgeon: Allyn Kenner, DO;  Location: [facility];  Service: Gynecology;  Laterality: N/A;  wide local excision of vulva   wide local excision high grade VIN  06/22/2012   Dr Rogue Bussing    OB History     Gravida  5   Para  2   Term  2   Preterm      AB      Living  2      SAB      IAB      Ectopic      Multiple      Live Births               Home Medications    Prior to Admission medications   Medication Sig Start  Date End Date Taking? Authorizing Provider  aspirin EC 81 MG tablet Take 1 tablet (81 mg total) by mouth daily. Swallow whole. 10/08/19  Yes Revankar, Reita Cliche, MD  b complex vitamins tablet Take 1 tablet by mouth daily.   Yes [provider]  cholecalciferol (VITAMIN D3) 25 MCG (1000 UNIT) tablet Take 1,000 Units by mouth daily.   Yes [provider]  Coenzyme Q10 (HM COQ-10) 200 MG capsule Take 1 capsule (200 mg total) by mouth daily. 10/08/19  Yes Revankar, Reita Cliche, MD  fluconazole (DIFLUCAN) 150 MG tablet Take 1 tablet (150 mg total) by mouth daily. 02/28/21  Yes Mate Alegria, Hildred Alamin E, FNP  ibuprofen (ADVIL) 800 MG tablet Take 800 mg by mouth every 6 (six) hours as needed.   Yes [provider]  LORazepam (ATIVAN) 0.5 MG tablet Take  0.5 mg by mouth every 8 (eight) hours as needed for anxiety.    Yes [provider]  metoprolol tartrate (LOPRESSOR) 25 MG tablet Take 75 mg by mouth 2 (two) times daily. Take 1  25 mg Tablet in the Morning. Take 2 Tablets 50 mg in Evening   Yes [provider]  omeprazole (PRILOSEC) 20 MG capsule Take 20 mg by mouth daily.   Yes [provider]  zolpidem (AMBIEN) 10 MG tablet Take 10 mg by mouth at bedtime as needed for sleep.   Yes [provider]  Ascorbic Acid 1000 MG CHEW Chew 1,000 mg by mouth daily.    [provider]  ipratropium (ATROVENT) 0.06 % nasal spray Place into the nose as needed.    [provider]    Family History Family History  Problem Relation Age of Onset   Heart attack Father    Hypercholesterolemia Father    Hypertension Father    Heart disease Mother    Hypertension Mother    Migraines Neg Hx    Stroke Neg Hx    Transient ischemic attack Neg Hx     Social History Social History   Tobacco Use   Smoking status: Former   Smokeless tobacco: Never  Scientific laboratory technician Use: Never used  Substance Use Topics   Alcohol use: Yes    Comment: socially "very little"   Drug use: No     Allergies   Clindamycin, Clindamycin/lincomycin, Doxycycline, and Latex   Review of Systems Review of Systems Per HPI  Physical Exam Triage Vital Signs ED Triage Vitals  Enc Vitals Group     BP 02/28/21 1308 (!) 145/82     Pulse Rate 02/28/21 1308 74     Resp 02/28/21 1308 18     Temp 02/28/21 1308 98 F (36.7 C)     Temp Source 02/28/21 1308 Oral     SpO2 02/28/21 1308 98 %     Weight 02/28/21 1309 166 lb (75.3 kg)     Height 02/28/21 1309 5' 2.25" (1.581 m)     Head Circumference --      Peak Flow --      Pain Score 02/28/21 1309 0     Pain Loc --      Pain Edu? --      Excl. in [place]? --    No data found.  Updated Vital Signs BP (!) 145/82 (BP Location: Right Arm)    Pulse 74    Temp 98 F (36.7 C)  (Oral)    Resp 18    Ht 5' 2.25" (1.581 m)  Wt 166 lb (75.3 kg)    SpO2 98%    BMI 30.12 kg/m   Visual Acuity Right Eye Distance:   Left Eye Distance:   Bilateral Distance:    Right Eye Near:   Left Eye Near:    Bilateral Near:     Physical Exam Constitutional:      General: She is not in acute distress.    Appearance: Normal appearance. She is not toxic-appearing or diaphoretic.  HENT:     Head: Normocephalic and atraumatic.  Eyes:     Extraocular Movements: Extraocular movements intact.     Conjunctiva/sclera: Conjunctivae normal.  Cardiovascular:     Rate and Rhythm: Normal rate and regular rhythm.     Pulses: Normal pulses.     Heart sounds: Normal heart sounds.  Pulmonary:     Effort: Pulmonary effort is normal. No respiratory distress.     Breath sounds: Normal breath sounds.  Abdominal:     General: Bowel sounds are normal. There is no distension.     Palpations: Abdomen is soft.     Tenderness: There is no abdominal tenderness.  Genitourinary:    Comments: Deferred with shared decision making.  Self swab performed. Neurological:     General: No focal deficit present.     Mental Status: She is alert and oriented to person, place, and time. Mental status is at baseline.  Psychiatric:        Mood and Affect: Mood normal.        Behavior: Behavior normal.        Thought Content: Thought content normal.        Judgment: Judgment normal.     UC Treatments / Results  Labs (all labs ordered are listed, but only abnormal results are displayed) Labs Reviewed  POCT URINALYSIS DIP (MANUAL ENTRY) - Abnormal; Notable for the following components:      Result Value   Protein Ur, POC trace (*)    Leukocytes, UA Small (1+) (*)    All other components within normal limits  URINE CULTURE  CERVICOVAGINAL ANCILLARY ONLY    EKG   Radiology No results found.  Procedures Procedures (including critical care time)  Medications Ordered in UC Medications - No data to  display  Initial Impression / Assessment and Plan / UC Course  I have reviewed the triage vital signs and the nursing notes.  Pertinent labs & imaging results that were available during my care of the patient were reviewed by me and considered in my medical decision making (see chart for details).     Suspect the patient's symptoms could be related to vaginal yeast infection given recent antibiotic therapy.  Urinalysis does show small amount of white blood cells but patient is currently taking amoxicillin which should treat urinary tract infection.  Will opt to treat with Diflucan and wait on urine culture to change or add treatment.  Vaginal swab pending to rule out yeast as well.  Discussed return precautions.  Patient verbalized understanding and was agreeable with plan. Final Clinical Impressions(s) / UC Diagnoses   Final diagnoses:  Dysuria  Urinary frequency     Discharge Instructions      It is suspected that you could have a yeast infection given recent antibiotic use.  You have been prescribed Diflucan which will help treat this.  Urine culture and vaginal swab are pending.  We will call if they are positive.    ED Prescriptions     Medication Sig Dispense  Auth. Provider   fluconazole (DIFLUCAN) 150 MG tablet Take 1 tablet (150 mg total) by mouth daily. 1 tablet [place], Michele Rockers, [place]      PDMP not reviewed this encounter.   Teodora Medici, [place] 02/28/21 1343

## 2021-03-01 LAB — URINE CULTURE: Culture: NO GROWTH

## 2021-03-01 LAB — CERVICOVAGINAL ANCILLARY ONLY
Bacterial Vaginitis (gardnerella): NEGATIVE
Candida Glabrata: NEGATIVE
Candida Vaginitis: NEGATIVE
Comment: NEGATIVE
Comment: NEGATIVE
Comment: NEGATIVE

## 2021-03-02 DIAGNOSIS — N390 Urinary tract infection, site not specified: Secondary | ICD-10-CM | POA: Diagnosis not present

## 2021-03-02 DIAGNOSIS — I10 Essential (primary) hypertension: Secondary | ICD-10-CM | POA: Diagnosis not present

## 2021-03-03 DIAGNOSIS — M9903 Segmental and somatic dysfunction of lumbar region: Secondary | ICD-10-CM | POA: Diagnosis not present

## 2021-03-03 DIAGNOSIS — M5136 Other intervertebral disc degeneration, lumbar region: Secondary | ICD-10-CM | POA: Diagnosis not present

## 2021-03-15 DIAGNOSIS — M9903 Segmental and somatic dysfunction of lumbar region: Secondary | ICD-10-CM | POA: Diagnosis not present

## 2021-03-15 DIAGNOSIS — M5136 Other intervertebral disc degeneration, lumbar region: Secondary | ICD-10-CM | POA: Diagnosis not present

## 2021-03-20 DIAGNOSIS — N390 Urinary tract infection, site not specified: Secondary | ICD-10-CM | POA: Diagnosis not present

## 2021-03-23 DIAGNOSIS — M9903 Segmental and somatic dysfunction of lumbar region: Secondary | ICD-10-CM | POA: Diagnosis not present

## 2021-03-23 DIAGNOSIS — M5136 Other intervertebral disc degeneration, lumbar region: Secondary | ICD-10-CM | POA: Diagnosis not present

## 2021-03-29 DIAGNOSIS — I10 Essential (primary) hypertension: Secondary | ICD-10-CM | POA: Diagnosis not present

## 2021-03-29 DIAGNOSIS — Z20822 Contact with and (suspected) exposure to covid-19: Secondary | ICD-10-CM | POA: Diagnosis not present

## 2021-03-29 DIAGNOSIS — F411 Generalized anxiety disorder: Secondary | ICD-10-CM | POA: Diagnosis not present

## 2021-04-02 DIAGNOSIS — Z20822 Contact with and (suspected) exposure to covid-19: Secondary | ICD-10-CM | POA: Diagnosis not present

## 2021-04-08 DIAGNOSIS — M5136 Other intervertebral disc degeneration, lumbar region: Secondary | ICD-10-CM | POA: Diagnosis not present

## 2021-04-08 DIAGNOSIS — M9903 Segmental and somatic dysfunction of lumbar region: Secondary | ICD-10-CM | POA: Diagnosis not present

## 2021-04-19 DIAGNOSIS — Z20822 Contact with and (suspected) exposure to covid-19: Secondary | ICD-10-CM | POA: Diagnosis not present

## 2021-04-19 DIAGNOSIS — M9903 Segmental and somatic dysfunction of lumbar region: Secondary | ICD-10-CM | POA: Diagnosis not present

## 2021-04-19 DIAGNOSIS — M5136 Other intervertebral disc degeneration, lumbar region: Secondary | ICD-10-CM | POA: Diagnosis not present

## 2021-04-29 DIAGNOSIS — M9903 Segmental and somatic dysfunction of lumbar region: Secondary | ICD-10-CM | POA: Diagnosis not present

## 2021-04-29 DIAGNOSIS — M5136 Other intervertebral disc degeneration, lumbar region: Secondary | ICD-10-CM | POA: Diagnosis not present

## 2021-05-04 DIAGNOSIS — M5136 Other intervertebral disc degeneration, lumbar region: Secondary | ICD-10-CM | POA: Diagnosis not present

## 2021-05-04 DIAGNOSIS — M9903 Segmental and somatic dysfunction of lumbar region: Secondary | ICD-10-CM | POA: Diagnosis not present

## 2021-05-10 DIAGNOSIS — Z20822 Contact with and (suspected) exposure to covid-19: Secondary | ICD-10-CM | POA: Diagnosis not present

## 2021-05-13 DIAGNOSIS — M9903 Segmental and somatic dysfunction of lumbar region: Secondary | ICD-10-CM | POA: Diagnosis not present

## 2021-05-13 DIAGNOSIS — M5136 Other intervertebral disc degeneration, lumbar region: Secondary | ICD-10-CM | POA: Diagnosis not present

## 2021-05-18 DIAGNOSIS — M9903 Segmental and somatic dysfunction of lumbar region: Secondary | ICD-10-CM | POA: Diagnosis not present

## 2021-05-18 DIAGNOSIS — M5136 Other intervertebral disc degeneration, lumbar region: Secondary | ICD-10-CM | POA: Diagnosis not present

## 2021-06-07 DIAGNOSIS — M9903 Segmental and somatic dysfunction of lumbar region: Secondary | ICD-10-CM | POA: Diagnosis not present

## 2021-06-07 DIAGNOSIS — M5136 Other intervertebral disc degeneration, lumbar region: Secondary | ICD-10-CM | POA: Diagnosis not present

## 2021-06-23 DIAGNOSIS — M9903 Segmental and somatic dysfunction of lumbar region: Secondary | ICD-10-CM | POA: Diagnosis not present

## 2021-06-23 DIAGNOSIS — M5136 Other intervertebral disc degeneration, lumbar region: Secondary | ICD-10-CM | POA: Diagnosis not present

## 2021-06-30 DIAGNOSIS — M9903 Segmental and somatic dysfunction of lumbar region: Secondary | ICD-10-CM | POA: Diagnosis not present

## 2021-06-30 DIAGNOSIS — M5136 Other intervertebral disc degeneration, lumbar region: Secondary | ICD-10-CM | POA: Diagnosis not present

## 2021-07-05 DIAGNOSIS — W57XXXA Bitten or stung by nonvenomous insect and other nonvenomous arthropods, initial encounter: Secondary | ICD-10-CM | POA: Diagnosis not present

## 2021-07-05 DIAGNOSIS — R21 Rash and other nonspecific skin eruption: Secondary | ICD-10-CM | POA: Diagnosis not present

## 2021-07-08 DIAGNOSIS — M9903 Segmental and somatic dysfunction of lumbar region: Secondary | ICD-10-CM | POA: Diagnosis not present

## 2021-07-08 DIAGNOSIS — M5136 Other intervertebral disc degeneration, lumbar region: Secondary | ICD-10-CM | POA: Diagnosis not present

## 2021-07-14 DIAGNOSIS — M9903 Segmental and somatic dysfunction of lumbar region: Secondary | ICD-10-CM | POA: Diagnosis not present

## 2021-07-14 DIAGNOSIS — M5136 Other intervertebral disc degeneration, lumbar region: Secondary | ICD-10-CM | POA: Diagnosis not present

## 2021-07-20 DIAGNOSIS — M5136 Other intervertebral disc degeneration, lumbar region: Secondary | ICD-10-CM | POA: Diagnosis not present

## 2021-07-20 DIAGNOSIS — M9903 Segmental and somatic dysfunction of lumbar region: Secondary | ICD-10-CM | POA: Diagnosis not present

## 2021-07-21 DIAGNOSIS — L570 Actinic keratosis: Secondary | ICD-10-CM | POA: Diagnosis not present

## 2021-07-21 DIAGNOSIS — D1801 Hemangioma of skin and subcutaneous tissue: Secondary | ICD-10-CM | POA: Diagnosis not present

## 2021-07-21 DIAGNOSIS — L821 Other seborrheic keratosis: Secondary | ICD-10-CM | POA: Diagnosis not present

## 2021-07-21 DIAGNOSIS — L812 Freckles: Secondary | ICD-10-CM | POA: Diagnosis not present

## 2021-07-21 DIAGNOSIS — D692 Other nonthrombocytopenic purpura: Secondary | ICD-10-CM | POA: Diagnosis not present

## 2021-07-21 DIAGNOSIS — L820 Inflamed seborrheic keratosis: Secondary | ICD-10-CM | POA: Diagnosis not present

## 2021-07-21 DIAGNOSIS — Z85828 Personal history of other malignant neoplasm of skin: Secondary | ICD-10-CM | POA: Diagnosis not present

## 2021-07-23 DIAGNOSIS — Z0000 Encounter for general adult medical examination without abnormal findings: Secondary | ICD-10-CM | POA: Diagnosis not present

## 2021-07-23 DIAGNOSIS — Z9181 History of falling: Secondary | ICD-10-CM | POA: Diagnosis not present

## 2021-07-23 DIAGNOSIS — Z1331 Encounter for screening for depression: Secondary | ICD-10-CM | POA: Diagnosis not present

## 2021-07-23 DIAGNOSIS — Z139 Encounter for screening, unspecified: Secondary | ICD-10-CM | POA: Diagnosis not present

## 2021-07-29 DIAGNOSIS — M9903 Segmental and somatic dysfunction of lumbar region: Secondary | ICD-10-CM | POA: Diagnosis not present

## 2021-07-29 DIAGNOSIS — M5136 Other intervertebral disc degeneration, lumbar region: Secondary | ICD-10-CM | POA: Diagnosis not present

## 2021-07-30 DIAGNOSIS — I10 Essential (primary) hypertension: Secondary | ICD-10-CM | POA: Diagnosis not present

## 2021-07-30 DIAGNOSIS — R21 Rash and other nonspecific skin eruption: Secondary | ICD-10-CM | POA: Diagnosis not present

## 2021-08-03 DIAGNOSIS — M9903 Segmental and somatic dysfunction of lumbar region: Secondary | ICD-10-CM | POA: Diagnosis not present

## 2021-08-03 DIAGNOSIS — M5136 Other intervertebral disc degeneration, lumbar region: Secondary | ICD-10-CM | POA: Diagnosis not present

## 2021-08-19 DIAGNOSIS — M9903 Segmental and somatic dysfunction of lumbar region: Secondary | ICD-10-CM | POA: Diagnosis not present

## 2021-08-19 DIAGNOSIS — M5136 Other intervertebral disc degeneration, lumbar region: Secondary | ICD-10-CM | POA: Diagnosis not present

## 2021-08-24 DIAGNOSIS — M9903 Segmental and somatic dysfunction of lumbar region: Secondary | ICD-10-CM | POA: Diagnosis not present

## 2021-08-24 DIAGNOSIS — M5136 Other intervertebral disc degeneration, lumbar region: Secondary | ICD-10-CM | POA: Diagnosis not present

## 2021-08-25 DIAGNOSIS — N390 Urinary tract infection, site not specified: Secondary | ICD-10-CM | POA: Diagnosis not present

## 2021-08-26 DIAGNOSIS — M9903 Segmental and somatic dysfunction of lumbar region: Secondary | ICD-10-CM | POA: Diagnosis not present

## 2021-08-26 DIAGNOSIS — M5136 Other intervertebral disc degeneration, lumbar region: Secondary | ICD-10-CM | POA: Diagnosis not present

## 2021-09-03 DIAGNOSIS — M1990 Unspecified osteoarthritis, unspecified site: Secondary | ICD-10-CM | POA: Diagnosis not present

## 2021-09-03 DIAGNOSIS — R002 Palpitations: Secondary | ICD-10-CM | POA: Diagnosis not present

## 2021-09-03 DIAGNOSIS — N390 Urinary tract infection, site not specified: Secondary | ICD-10-CM | POA: Diagnosis not present

## 2021-09-03 DIAGNOSIS — G4700 Insomnia, unspecified: Secondary | ICD-10-CM | POA: Diagnosis not present

## 2021-09-03 DIAGNOSIS — F411 Generalized anxiety disorder: Secondary | ICD-10-CM | POA: Diagnosis not present

## 2021-09-03 DIAGNOSIS — I10 Essential (primary) hypertension: Secondary | ICD-10-CM | POA: Diagnosis not present

## 2021-09-03 DIAGNOSIS — E785 Hyperlipidemia, unspecified: Secondary | ICD-10-CM | POA: Diagnosis not present

## 2021-09-08 DIAGNOSIS — M5136 Other intervertebral disc degeneration, lumbar region: Secondary | ICD-10-CM | POA: Diagnosis not present

## 2021-09-08 DIAGNOSIS — M9903 Segmental and somatic dysfunction of lumbar region: Secondary | ICD-10-CM | POA: Diagnosis not present

## 2021-09-13 DIAGNOSIS — M5136 Other intervertebral disc degeneration, lumbar region: Secondary | ICD-10-CM | POA: Diagnosis not present

## 2021-09-13 DIAGNOSIS — M9903 Segmental and somatic dysfunction of lumbar region: Secondary | ICD-10-CM | POA: Diagnosis not present

## 2021-09-16 ENCOUNTER — Encounter (HOSPITAL_BASED_OUTPATIENT_CLINIC_OR_DEPARTMENT_OTHER): Payer: Self-pay | Admitting: Cardiology

## 2021-09-16 ENCOUNTER — Ambulatory Visit (INDEPENDENT_AMBULATORY_CARE_PROVIDER_SITE_OTHER): Payer: Medicare Other | Admitting: Cardiology

## 2021-09-16 VITALS — BP 148/78 | HR 61 | Ht 62.0 in | Wt 155.2 lb

## 2021-09-16 DIAGNOSIS — E782 Mixed hyperlipidemia: Secondary | ICD-10-CM

## 2021-09-16 DIAGNOSIS — I2510 Atherosclerotic heart disease of native coronary artery without angina pectoris: Secondary | ICD-10-CM

## 2021-09-16 DIAGNOSIS — I10 Essential (primary) hypertension: Secondary | ICD-10-CM

## 2021-09-16 DIAGNOSIS — R931 Abnormal findings on diagnostic imaging of heart and coronary circulation: Secondary | ICD-10-CM

## 2021-09-16 DIAGNOSIS — Z8673 Personal history of transient ischemic attack (TIA), and cerebral infarction without residual deficits: Secondary | ICD-10-CM

## 2021-09-16 DIAGNOSIS — Z7189 Other specified counseling: Secondary | ICD-10-CM

## 2021-09-16 NOTE — Progress Notes (Signed)
Cardiology Office Note:    Date:  09/16/2021   ID:  Sharon Marsh, DOB 1945-09-27, MRN [id number]  PCP:  Lowella Dandy, NP  Cardiologist:  Buford Dresser, MD  Referring MD: Lowella Dandy, NP   CC: follow up  History of Present Illness:    Sharon Marsh is a 76 y.o. female with a hx of hypertension, dyslipidemia, obesity, elevated coronary calcium score who is seen for follow up today. I initially met her 01/10/20 as a new consult at the request of Moon, Amy A, NP for the evaluation and management of chest pain. She was previously followed by Dr. Haydee Salter, last visit 10/08/19.  Today: She reports she has been doing well at home. She stopped her daily dose of baby aspirin due to excessive easy bruising. No blood in stool or urine.   She reports good control of her blood pressure at home, but she forgot to bring her log with her today. Her blood pressure has been ranging around 136/80. Initial blood pressure today was elevated but she has had several appointments today which she feels may have contributed to this elevation. In clinic blood pressure was 154/80. At retake it was slightly less at 148/78.   She was given the injectable cholesterol medication (PCSK9i) by her PCP and has it at home in the fridge. She has not used it because she read up on the possible side effects and was concerned that they may start up again and she would not be able to resolve them due to the long acting nature of the medication. Her most recent cholesterol levels were elevated, specifically cholesterol was 205. Even when she took  medication her cholesterol levels did not respond so she is currently not motivated to try the injectable medication due to her side effects on the medications that she tried previously.   She was dealing with increased stress after caring for her elderly neighbor who ended up passing away. She was given buspar to take as needed but it has not been an issue lately.  She has  not been walking as much due to knee pain in the L knee. She tried yoga a week ago to try and find an activity that she can tolerate more but she did not tolerate it well.   She denies any palpitations, chest pain, shortness of breath, or peripheral edema. No lightheadedness, headaches, syncope, orthopnea, or PND.   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   Obstructive sleep apnea    Osteoarthritis of left knee 08/09/2017   Osteoarthritis of right knee 10/03/2018   Osteoporosis 02/12/2019   Other and unspecified hyperlipidemia    Personal history of vulvar dysplasia    PONV (postoperative nausea and vomiting)    Unspecified essential hypertension 08/15/2013   Unspecified hereditary and idiopathic peripheral neuropathy 08/15/2013    Past Surgical History:  Procedure Laterality Date   ABDOMINAL HYSTERECTOMY     APPENDECTOMY  1950   CATARACT EXTRACTION Bilateral 2008   KNEE SURGERY Left 2018   Arthroscopic knee surg for torn meniscus   THYROID SURGERY  1980   VULVECTOMY N/A 06/22/2012   Procedure: WIDE EXCISION VULVECTOMY;  Surgeon: Sidney Callahan, DO;  Location: [facility];  Service: Gynecology;  Laterality: N/A;  wide local excision of vulva   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   aspirin EC 81 MG tablet Take 1 tablet (81 mg total) by mouth daily. Swallow whole. (Patient not taking: Reported on 09/16/2021)   b complex vitamins tablet Take 1 tablet by mouth daily.   cholecalciferol (VITAMIN D3) 25 MCG (1000 UNIT) tablet Take  1,000 Units by mouth daily.   ibuprofen (ADVIL) 800 MG tablet Take 800 mg by mouth every 6 (six) hours as needed.   ipratropium (ATROVENT) 0.06 % nasal spray Place into the nose as needed.   LORazepam (ATIVAN) 0.5 MG tablet Take 0.5 mg by mouth every 8 (eight) hours as needed for anxiety.    metoprolol tartrate (LOPRESSOR) 25 MG tablet Take 75 mg by mouth 2 (two) times daily. Take 1  25 mg Tablet in the Morning. Take 2 Tablets 50 mg in Evening   omeprazole (PRILOSEC) 20 MG capsule Take 20 mg by mouth daily.   zolpidem (AMBIEN) 10 MG tablet Take 10 mg by mouth at bedtime as needed for sleep.   No current facility-administered medications on file prior to visit.     Allergies:   Clindamycin, Clindamycin/lincomycin, Doxycycline, and Latex   Social History   Tobacco Use   Smoking status: Former   Smokeless tobacco: Never  Vaping Use   Vaping Use: Never used  Substance Use Topics   Alcohol use: Yes    Comment: socially "very little"   Drug use: No    Family History: family history includes Heart attack in her father; Heart disease in her mother; Hypercholesterolemia in her father; Hypertension in her father and mother. There is no history of Migraines, Stroke, or Transient ischemic attack.  ROS:   Please see the history of present illness.   (+) ecchymosis bilateral LE (+) R knee pain Additional pertinent ROS otherwise unremarkable   EKGs/Labs/Other Studies Reviewed:    The following studies were reviewed today:  Monitor 07/27/2020: 12 days of data recorded on Zio monitor. Patient had a min HR of 48 bpm, max HR of 193 bpm, and avg HR of 77 bpm. Predominant underlying rhythm was Sinus Rhythm. 8 Supraventricular Tachycardia runs occurred, the run with the fastest interval lasting 6 beats with a max rate of 193 bpm, the longest lasting 12 beats with an avg rate of 147 bpm. No VT, atrial fibrillation, high degree block, or pauses noted. Isolated atrial and ventricular ectopy was rare  (<1%). There were 22 triggered events, which were sinus. No significant arrhythmias detected.  CT ca score 10/01/19 Coronary calcium score of 226 . This was 74 percentile for age and sex matched control.  Echo 07/30/2019: 1. Left ventricular ejection fraction, by estimation, is 60 to 65%. The  left ventricle has normal function. The left ventricle has no regional  wall motion abnormalities. There is mild concentric left ventricular  hypertrophy. Left ventricular diastolic  parameters are consistent with Grade I diastolic dysfunction (impaired  relaxation).   2. Right ventricular systolic function is normal. The right ventricular  size is normal. There is normal pulmonary artery systolic pressure.   3. The mitral valve is degenerative. Mild mitral valve regurgitation. No  evidence of mitral stenosis.     4. The aortic valve is tricuspid. Aortic valve regurgitation is mild. No  aortic stenosis is present.   5. The inferior vena cava is normal in size with greater than 50%  respiratory variability, suggesting right atrial pressure of 3 mmHg.   EKG:  EKG is personally reviewed.   09/16/2021:  NSR at 61 bpm 08/04/2020: not ordered 02/12/2020: EKG was not ordered. 01/10/20: NSR at 60 bpm  Recent Labs: No results found for requested labs within last 365 days.   Recent Lipid Panel No results found for: "CHOL", "TRIG", "HDL", "CHOLHDL", "VLDL", "LDLCALC", "LDLDIRECT"  Physical Exam:    VS:  BP (!) 148/78 (BP Location: Right Arm, Patient Position: Sitting, Cuff Size: Normal)   Pulse 61   Ht 5' 2" (1.575 m)   Wt 155 lb 3.2 oz (70.4 kg)   BMI 28.39 kg/m     Wt Readings from Last 3 Encounters:  09/16/21 155 lb 3.2 oz (70.4 kg)  02/28/21 166 lb (75.3 kg)  08/04/20 172 lb 6.4 oz (78.2 kg)    GEN: Well nourished, well developed in no acute distress HEENT: Normal, moist mucous membranes NECK: No JVD CARDIAC: regular rhythm, normal S1 and S2, no rubs or gallops. No murmur. VASCULAR: Radial and  DP pulses 2+ bilaterally. No carotid bruits RESPIRATORY:  Clear to auscultation without rales, wheezing or rhonchi  ABDOMEN: Soft, non-tender, non-distended MUSCULOSKELETAL:  Ambulates independently SKIN: Warm and dry, no edema, ecchymosis LE bilaterally  NEUROLOGIC:  Alert and oriented x 3. No focal neuro deficits noted. PSYCHIATRIC:  Normal affect   ASSESSMENT:    1. Coronary artery calcification seen on CT scan   2. Primary hypertension   3. History of transient ischemic attack (TIA)   4. Mixed hyperlipidemia   5. Cardiac risk counseling   6. Counseling on health promotion and disease prevention      PLAN:    Elevated coronary calcium score, suggestive of CAD History of TIA Mixed hyperlipidemia -continue aspirin 81 mg daily -she has been intolerant of multiple statins. Given CAD, elevated numbers, I agree with the recommendation for PCSK9i therapy. We discussed these at length today. She will consider. -instructed on red flag warning signs that need immediate medical attention  Hypertension -goal <130/80 -above goal in office, reports better control at home -did not tolerate amlodipine.  -continue metoprolol -follow up with her PCP  Cardiac risk counseling and prevention recommendations: -recommend heart healthy/Mediterranean diet, with whole grains, fruits, vegetable, fish, lean meats, nuts, and olive oil. Limit salt. -recommend moderate walking, 3-5 times/week for 30-50 minutes each session. Aim for at least 150 minutes.week. Goal should be pace of 3 miles/hours, or walking 1.5 miles in 30 minutes -recommend avoidance of tobacco products. Avoid excess alcohol.  Palpitations: resolved -monitor reassuring, no significant arrhythmias, symptomatic events were sinus  Plan for follow up: 1 year or sooner PRN  Buford Dresser, MD, PhD, [facility]   Medication Adjustments/Labs and Tests Ordered: Current medicines are reviewed at length with  the patient today.  Concerns regarding medicines are outlined above.   Orders Placed This Encounter  Procedures   EKG 12-Lead   No orders of the defined types were placed in this encounter.  Patient Instructions  Medication Instructions:  Your Physician recommend you continue on your current medication as directed.     *If you need a refill on your cardiac medications before your next appointment, please call your pharmacy*   Lab Work: None ordered today  Testing/Procedures: None ordered today   Follow-Up: At [facility], you and your health needs are our priority.  As part of our continuing mission to provide you with exceptional heart care, we have created designated Provider Care Teams.  These Care Teams include your primary Cardiologist (physician) and Advanced Practice Providers (APPs -  Physician Assistants and Nurse Practitioners) who all work together to provide you with the care you need, when you need it.  We recommend signing up for the patient portal called "MyChart".  Sign up information is provided on this After Visit Summary.  MyChart is used to connect with patients for Virtual Visits (Telemedicine).  Patients are able to view lab/test results, encounter notes, upcoming appointments, etc.  Non-urgent messages can be sent to your provider as well.   To learn more about what you can do with MyChart, go to [email or web address].    Your next appointment:   1 year(s)  The format for your next appointment:   In Person  Provider:   Bridgette Christopher, MD              I,Jessica Ford,acting as a scribe for Bridgette Christopher, MD.,have documented all relevant documentation on the behalf of Bridgette Christopher, MD,as directed by  Bridgette Christopher, MD while in the presence of Bridgette Christopher, MD.   I, Bridgette Christopher, MD, have reviewed all documentation for this visit. The documentation on 09/16/21 for the exam, diagnosis,  procedures, and orders are all accurate and complete.   Signed, Bridgette Christopher, MD PhD 09/16/2021     [facility] 

## 2021-09-16 NOTE — Patient Instructions (Signed)
Medication Instructions:  Your Physician recommend you continue on your current medication as directed.    *If you need a refill on your cardiac medications before your next appointment, please call your pharmacy*   Lab Work: None ordered today   Testing/Procedures: None ordered today   Follow-Up: At [facility], you and your health needs are our priority.  As part of our continuing mission to provide you with exceptional heart care, we have created designated Provider Care Teams.  These Care Teams include your primary Cardiologist (physician) and Advanced Practice Providers (APPs -  Physician Assistants and Nurse Practitioners) who all work together to provide you with the care you need, when you need it.  We recommend signing up for the patient portal called "MyChart".  Sign up information is provided on this After Visit Summary.  MyChart is used to connect with patients for Virtual Visits (Telemedicine).  Patients are able to view lab/test results, encounter notes, upcoming appointments, etc.  Non-urgent messages can be sent to your provider as well.   To learn more about what you can do with MyChart, go to [email or web address].    Your next appointment:   1 year(s)  The format for your next appointment:   In Person  Provider:   Bridgette Christopher, MD          

## 2021-09-17 ENCOUNTER — Encounter (HOSPITAL_BASED_OUTPATIENT_CLINIC_OR_DEPARTMENT_OTHER): Payer: Self-pay | Admitting: Cardiology

## 2021-09-27 DIAGNOSIS — M9903 Segmental and somatic dysfunction of lumbar region: Secondary | ICD-10-CM | POA: Diagnosis not present

## 2021-09-27 DIAGNOSIS — M5136 Other intervertebral disc degeneration, lumbar region: Secondary | ICD-10-CM | POA: Diagnosis not present

## 2021-10-07 DIAGNOSIS — N952 Postmenopausal atrophic vaginitis: Secondary | ICD-10-CM | POA: Diagnosis not present

## 2021-10-07 DIAGNOSIS — N390 Urinary tract infection, site not specified: Secondary | ICD-10-CM | POA: Diagnosis not present

## 2021-10-07 DIAGNOSIS — Z79899 Other long term (current) drug therapy: Secondary | ICD-10-CM | POA: Diagnosis not present

## 2021-10-07 DIAGNOSIS — M9903 Segmental and somatic dysfunction of lumbar region: Secondary | ICD-10-CM | POA: Diagnosis not present

## 2021-10-07 DIAGNOSIS — M5136 Other intervertebral disc degeneration, lumbar region: Secondary | ICD-10-CM | POA: Diagnosis not present

## 2021-10-13 DIAGNOSIS — M9903 Segmental and somatic dysfunction of lumbar region: Secondary | ICD-10-CM | POA: Diagnosis not present

## 2021-10-13 DIAGNOSIS — M5136 Other intervertebral disc degeneration, lumbar region: Secondary | ICD-10-CM | POA: Diagnosis not present

## 2021-10-21 DIAGNOSIS — M9903 Segmental and somatic dysfunction of lumbar region: Secondary | ICD-10-CM | POA: Diagnosis not present

## 2021-10-21 DIAGNOSIS — M5136 Other intervertebral disc degeneration, lumbar region: Secondary | ICD-10-CM | POA: Diagnosis not present

## 2021-10-22 ENCOUNTER — Telehealth: Payer: Self-pay

## 2021-10-22 NOTE — Patient Outreach (Signed)
  Care Coordination   Initial Visit Note   10/22/2021 Name: Sharon Marsh MRN: [id number] DOB: 12/11/1945  Sharon Marsh is a 76 y.o. year old female who sees Moon, Amy A, NP for primary care. I spoke with  Maryln Gottron by phone today.  What matters to the patients health and wellness today?  Placed call to patient today and reviewed and offered [facility] care coordination program.  Patient reports that she is doing well and declines services at this time.    SDOH assessments and interventions completed:  No     Care Coordination Interventions Activated:  No  Care Coordination Interventions:  No, not indicated   Follow up plan: No further intervention required.   Encounter Outcome:  Pt. Refused   Tomasa Rand, RN, BSN, CEN [facility] [employer] [phone]

## 2021-11-01 DIAGNOSIS — M5136 Other intervertebral disc degeneration, lumbar region: Secondary | ICD-10-CM | POA: Diagnosis not present

## 2021-11-01 DIAGNOSIS — M9903 Segmental and somatic dysfunction of lumbar region: Secondary | ICD-10-CM | POA: Diagnosis not present

## 2021-11-03 DIAGNOSIS — M5136 Other intervertebral disc degeneration, lumbar region: Secondary | ICD-10-CM | POA: Diagnosis not present

## 2021-11-03 DIAGNOSIS — M9903 Segmental and somatic dysfunction of lumbar region: Secondary | ICD-10-CM | POA: Diagnosis not present

## 2021-11-10 DIAGNOSIS — M5136 Other intervertebral disc degeneration, lumbar region: Secondary | ICD-10-CM | POA: Diagnosis not present

## 2021-11-10 DIAGNOSIS — M9903 Segmental and somatic dysfunction of lumbar region: Secondary | ICD-10-CM | POA: Diagnosis not present

## 2021-11-15 DIAGNOSIS — M5136 Other intervertebral disc degeneration, lumbar region: Secondary | ICD-10-CM | POA: Diagnosis not present

## 2021-11-15 DIAGNOSIS — M9903 Segmental and somatic dysfunction of lumbar region: Secondary | ICD-10-CM | POA: Diagnosis not present

## 2021-11-18 DIAGNOSIS — M5136 Other intervertebral disc degeneration, lumbar region: Secondary | ICD-10-CM | POA: Diagnosis not present

## 2021-11-18 DIAGNOSIS — M9903 Segmental and somatic dysfunction of lumbar region: Secondary | ICD-10-CM | POA: Diagnosis not present

## 2021-11-19 DIAGNOSIS — M5451 Vertebrogenic low back pain: Secondary | ICD-10-CM | POA: Diagnosis not present

## 2021-11-22 DIAGNOSIS — M5136 Other intervertebral disc degeneration, lumbar region: Secondary | ICD-10-CM | POA: Diagnosis not present

## 2021-11-22 DIAGNOSIS — M9903 Segmental and somatic dysfunction of lumbar region: Secondary | ICD-10-CM | POA: Diagnosis not present

## 2021-12-02 DIAGNOSIS — M5136 Other intervertebral disc degeneration, lumbar region: Secondary | ICD-10-CM | POA: Diagnosis not present

## 2021-12-02 DIAGNOSIS — M9903 Segmental and somatic dysfunction of lumbar region: Secondary | ICD-10-CM | POA: Diagnosis not present

## 2021-12-07 DIAGNOSIS — N390 Urinary tract infection, site not specified: Secondary | ICD-10-CM | POA: Diagnosis not present

## 2021-12-07 DIAGNOSIS — N952 Postmenopausal atrophic vaginitis: Secondary | ICD-10-CM | POA: Diagnosis not present

## 2021-12-16 DIAGNOSIS — M5136 Other intervertebral disc degeneration, lumbar region: Secondary | ICD-10-CM | POA: Diagnosis not present

## 2021-12-16 DIAGNOSIS — M9903 Segmental and somatic dysfunction of lumbar region: Secondary | ICD-10-CM | POA: Diagnosis not present

## 2021-12-20 DIAGNOSIS — N390 Urinary tract infection, site not specified: Secondary | ICD-10-CM | POA: Diagnosis not present

## 2021-12-26 DIAGNOSIS — U071 COVID-19: Secondary | ICD-10-CM | POA: Diagnosis not present

## 2021-12-26 DIAGNOSIS — I10 Essential (primary) hypertension: Secondary | ICD-10-CM | POA: Diagnosis not present

## 2022-01-11 DIAGNOSIS — R531 Weakness: Secondary | ICD-10-CM | POA: Diagnosis not present

## 2022-01-11 DIAGNOSIS — E538 Deficiency of other specified B group vitamins: Secondary | ICD-10-CM | POA: Diagnosis not present

## 2022-01-11 DIAGNOSIS — U071 COVID-19: Secondary | ICD-10-CM | POA: Diagnosis not present

## 2022-01-13 DIAGNOSIS — M5136 Other intervertebral disc degeneration, lumbar region: Secondary | ICD-10-CM | POA: Diagnosis not present

## 2022-01-13 DIAGNOSIS — M9903 Segmental and somatic dysfunction of lumbar region: Secondary | ICD-10-CM | POA: Diagnosis not present

## 2022-01-20 DIAGNOSIS — M5136 Other intervertebral disc degeneration, lumbar region: Secondary | ICD-10-CM | POA: Diagnosis not present

## 2022-01-20 DIAGNOSIS — M9903 Segmental and somatic dysfunction of lumbar region: Secondary | ICD-10-CM | POA: Diagnosis not present

## 2022-02-01 DIAGNOSIS — M9903 Segmental and somatic dysfunction of lumbar region: Secondary | ICD-10-CM | POA: Diagnosis not present

## 2022-02-01 DIAGNOSIS — M5136 Other intervertebral disc degeneration, lumbar region: Secondary | ICD-10-CM | POA: Diagnosis not present

## 2022-02-09 DIAGNOSIS — M5136 Other intervertebral disc degeneration, lumbar region: Secondary | ICD-10-CM | POA: Diagnosis not present

## 2022-02-09 DIAGNOSIS — M9903 Segmental and somatic dysfunction of lumbar region: Secondary | ICD-10-CM | POA: Diagnosis not present

## 2022-02-15 DIAGNOSIS — M5136 Other intervertebral disc degeneration, lumbar region: Secondary | ICD-10-CM | POA: Diagnosis not present

## 2022-02-15 DIAGNOSIS — M9903 Segmental and somatic dysfunction of lumbar region: Secondary | ICD-10-CM | POA: Diagnosis not present

## 2022-03-02 DIAGNOSIS — M4306 Spondylolysis, lumbar region: Secondary | ICD-10-CM | POA: Diagnosis not present

## 2022-03-02 DIAGNOSIS — M545 Low back pain, unspecified: Secondary | ICD-10-CM | POA: Diagnosis not present

## 2022-03-02 DIAGNOSIS — M5136 Other intervertebral disc degeneration, lumbar region: Secondary | ICD-10-CM | POA: Diagnosis not present

## 2022-03-03 DIAGNOSIS — M9903 Segmental and somatic dysfunction of lumbar region: Secondary | ICD-10-CM | POA: Diagnosis not present

## 2022-03-03 DIAGNOSIS — M5136 Other intervertebral disc degeneration, lumbar region: Secondary | ICD-10-CM | POA: Diagnosis not present

## 2022-03-07 DIAGNOSIS — N390 Urinary tract infection, site not specified: Secondary | ICD-10-CM | POA: Diagnosis not present

## 2022-03-07 DIAGNOSIS — M5136 Other intervertebral disc degeneration, lumbar region: Secondary | ICD-10-CM | POA: Diagnosis not present

## 2022-03-07 DIAGNOSIS — F411 Generalized anxiety disorder: Secondary | ICD-10-CM | POA: Diagnosis not present

## 2022-03-07 DIAGNOSIS — M1990 Unspecified osteoarthritis, unspecified site: Secondary | ICD-10-CM | POA: Diagnosis not present

## 2022-03-07 DIAGNOSIS — G4700 Insomnia, unspecified: Secondary | ICD-10-CM | POA: Diagnosis not present

## 2022-03-07 DIAGNOSIS — I10 Essential (primary) hypertension: Secondary | ICD-10-CM | POA: Diagnosis not present

## 2022-03-07 DIAGNOSIS — E785 Hyperlipidemia, unspecified: Secondary | ICD-10-CM | POA: Diagnosis not present

## 2022-03-07 DIAGNOSIS — R002 Palpitations: Secondary | ICD-10-CM | POA: Diagnosis not present

## 2022-03-07 DIAGNOSIS — M9903 Segmental and somatic dysfunction of lumbar region: Secondary | ICD-10-CM | POA: Diagnosis not present

## 2022-03-09 DIAGNOSIS — M5136 Other intervertebral disc degeneration, lumbar region: Secondary | ICD-10-CM | POA: Diagnosis not present

## 2022-03-09 DIAGNOSIS — M9903 Segmental and somatic dysfunction of lumbar region: Secondary | ICD-10-CM | POA: Diagnosis not present

## 2022-03-14 DIAGNOSIS — M9903 Segmental and somatic dysfunction of lumbar region: Secondary | ICD-10-CM | POA: Diagnosis not present

## 2022-03-14 DIAGNOSIS — M5136 Other intervertebral disc degeneration, lumbar region: Secondary | ICD-10-CM | POA: Diagnosis not present

## 2022-03-17 DIAGNOSIS — M9903 Segmental and somatic dysfunction of lumbar region: Secondary | ICD-10-CM | POA: Diagnosis not present

## 2022-03-17 DIAGNOSIS — M5136 Other intervertebral disc degeneration, lumbar region: Secondary | ICD-10-CM | POA: Diagnosis not present

## 2022-03-23 DIAGNOSIS — M5136 Other intervertebral disc degeneration, lumbar region: Secondary | ICD-10-CM | POA: Diagnosis not present

## 2022-03-23 DIAGNOSIS — M9903 Segmental and somatic dysfunction of lumbar region: Secondary | ICD-10-CM | POA: Diagnosis not present

## 2022-03-30 DIAGNOSIS — M5136 Other intervertebral disc degeneration, lumbar region: Secondary | ICD-10-CM | POA: Diagnosis not present

## 2022-03-30 DIAGNOSIS — M9903 Segmental and somatic dysfunction of lumbar region: Secondary | ICD-10-CM | POA: Diagnosis not present

## 2022-04-04 DIAGNOSIS — L237 Allergic contact dermatitis due to plants, except food: Secondary | ICD-10-CM | POA: Diagnosis not present

## 2022-04-04 DIAGNOSIS — I10 Essential (primary) hypertension: Secondary | ICD-10-CM | POA: Diagnosis not present

## 2022-04-11 DIAGNOSIS — H6121 Impacted cerumen, right ear: Secondary | ICD-10-CM | POA: Diagnosis not present

## 2022-04-11 DIAGNOSIS — H60501 Unspecified acute noninfective otitis externa, right ear: Secondary | ICD-10-CM | POA: Diagnosis not present

## 2022-04-11 DIAGNOSIS — L255 Unspecified contact dermatitis due to plants, except food: Secondary | ICD-10-CM | POA: Diagnosis not present

## 2022-04-12 DIAGNOSIS — M5136 Other intervertebral disc degeneration, lumbar region: Secondary | ICD-10-CM | POA: Diagnosis not present

## 2022-04-12 DIAGNOSIS — M9903 Segmental and somatic dysfunction of lumbar region: Secondary | ICD-10-CM | POA: Diagnosis not present

## 2022-04-14 DIAGNOSIS — R21 Rash and other nonspecific skin eruption: Secondary | ICD-10-CM | POA: Diagnosis not present

## 2022-04-21 DIAGNOSIS — M5136 Other intervertebral disc degeneration, lumbar region: Secondary | ICD-10-CM | POA: Diagnosis not present

## 2022-04-21 DIAGNOSIS — M9903 Segmental and somatic dysfunction of lumbar region: Secondary | ICD-10-CM | POA: Diagnosis not present

## 2022-04-22 DIAGNOSIS — Z6827 Body mass index (BMI) 27.0-27.9, adult: Secondary | ICD-10-CM | POA: Diagnosis not present

## 2022-04-22 DIAGNOSIS — N898 Other specified noninflammatory disorders of vagina: Secondary | ICD-10-CM | POA: Diagnosis not present

## 2022-04-22 DIAGNOSIS — Z1231 Encounter for screening mammogram for malignant neoplasm of breast: Secondary | ICD-10-CM | POA: Diagnosis not present

## 2022-04-22 DIAGNOSIS — N890 Mild vaginal dysplasia: Secondary | ICD-10-CM | POA: Diagnosis not present

## 2022-04-22 DIAGNOSIS — Z124 Encounter for screening for malignant neoplasm of cervix: Secondary | ICD-10-CM | POA: Diagnosis not present

## 2022-04-26 ENCOUNTER — Other Ambulatory Visit: Payer: Self-pay | Admitting: Obstetrics and Gynecology

## 2022-04-26 DIAGNOSIS — R928 Other abnormal and inconclusive findings on diagnostic imaging of breast: Secondary | ICD-10-CM

## 2022-04-29 DIAGNOSIS — H6993 Unspecified Eustachian tube disorder, bilateral: Secondary | ICD-10-CM | POA: Diagnosis not present

## 2022-04-29 DIAGNOSIS — H6012 Cellulitis of left external ear: Secondary | ICD-10-CM | POA: Diagnosis not present

## 2022-05-04 DIAGNOSIS — M5136 Other intervertebral disc degeneration, lumbar region: Secondary | ICD-10-CM | POA: Diagnosis not present

## 2022-05-04 DIAGNOSIS — M9903 Segmental and somatic dysfunction of lumbar region: Secondary | ICD-10-CM | POA: Diagnosis not present

## 2022-05-05 ENCOUNTER — Ambulatory Visit
Admission: RE | Admit: 2022-05-05 | Discharge: 2022-05-05 | Disposition: A | Discharge status: Home / Self Care | Payer: Medicare Other | Source: Ambulatory Visit | Attending: Obstetrics and Gynecology | Admitting: Obstetrics and Gynecology

## 2022-05-05 DIAGNOSIS — R928 Other abnormal and inconclusive findings on diagnostic imaging of breast: Secondary | ICD-10-CM

## 2022-05-05 DIAGNOSIS — R922 Inconclusive mammogram: Secondary | ICD-10-CM | POA: Diagnosis not present

## 2022-05-13 DIAGNOSIS — Z20822 Contact with and (suspected) exposure to covid-19: Secondary | ICD-10-CM | POA: Diagnosis not present

## 2022-05-13 DIAGNOSIS — J019 Acute sinusitis, unspecified: Secondary | ICD-10-CM | POA: Diagnosis not present

## 2022-05-13 DIAGNOSIS — F419 Anxiety disorder, unspecified: Secondary | ICD-10-CM | POA: Diagnosis not present

## 2022-05-13 DIAGNOSIS — I10 Essential (primary) hypertension: Secondary | ICD-10-CM | POA: Diagnosis not present

## 2022-05-19 DIAGNOSIS — M5136 Other intervertebral disc degeneration, lumbar region: Secondary | ICD-10-CM | POA: Diagnosis not present

## 2022-05-19 DIAGNOSIS — M9903 Segmental and somatic dysfunction of lumbar region: Secondary | ICD-10-CM | POA: Diagnosis not present

## 2022-05-31 DIAGNOSIS — M5136 Other intervertebral disc degeneration, lumbar region: Secondary | ICD-10-CM | POA: Diagnosis not present

## 2022-05-31 DIAGNOSIS — M9903 Segmental and somatic dysfunction of lumbar region: Secondary | ICD-10-CM | POA: Diagnosis not present

## 2022-06-01 DIAGNOSIS — M5136 Other intervertebral disc degeneration, lumbar region: Secondary | ICD-10-CM | POA: Diagnosis not present

## 2022-06-01 DIAGNOSIS — M9903 Segmental and somatic dysfunction of lumbar region: Secondary | ICD-10-CM | POA: Diagnosis not present

## 2022-06-23 DIAGNOSIS — M5136 Other intervertebral disc degeneration, lumbar region: Secondary | ICD-10-CM | POA: Diagnosis not present

## 2022-06-23 DIAGNOSIS — M9903 Segmental and somatic dysfunction of lumbar region: Secondary | ICD-10-CM | POA: Diagnosis not present

## 2022-06-29 DIAGNOSIS — M5136 Other intervertebral disc degeneration, lumbar region: Secondary | ICD-10-CM | POA: Diagnosis not present

## 2022-06-29 DIAGNOSIS — M9903 Segmental and somatic dysfunction of lumbar region: Secondary | ICD-10-CM | POA: Diagnosis not present

## 2022-07-05 DIAGNOSIS — M5136 Other intervertebral disc degeneration, lumbar region: Secondary | ICD-10-CM | POA: Diagnosis not present

## 2022-07-05 DIAGNOSIS — M9903 Segmental and somatic dysfunction of lumbar region: Secondary | ICD-10-CM | POA: Diagnosis not present

## 2022-07-19 DIAGNOSIS — H26493 Other secondary cataract, bilateral: Secondary | ICD-10-CM | POA: Diagnosis not present

## 2022-07-19 DIAGNOSIS — H04123 Dry eye syndrome of bilateral lacrimal glands: Secondary | ICD-10-CM | POA: Diagnosis not present

## 2022-07-19 DIAGNOSIS — Z961 Presence of intraocular lens: Secondary | ICD-10-CM | POA: Diagnosis not present

## 2022-07-27 DIAGNOSIS — M5136 Other intervertebral disc degeneration, lumbar region: Secondary | ICD-10-CM | POA: Diagnosis not present

## 2022-07-27 DIAGNOSIS — M9903 Segmental and somatic dysfunction of lumbar region: Secondary | ICD-10-CM | POA: Diagnosis not present

## 2022-08-16 DIAGNOSIS — Z85828 Personal history of other malignant neoplasm of skin: Secondary | ICD-10-CM | POA: Diagnosis not present

## 2022-08-16 DIAGNOSIS — D692 Other nonthrombocytopenic purpura: Secondary | ICD-10-CM | POA: Diagnosis not present

## 2022-08-16 DIAGNOSIS — L570 Actinic keratosis: Secondary | ICD-10-CM | POA: Diagnosis not present

## 2022-08-16 DIAGNOSIS — L821 Other seborrheic keratosis: Secondary | ICD-10-CM | POA: Diagnosis not present

## 2022-08-16 DIAGNOSIS — L812 Freckles: Secondary | ICD-10-CM | POA: Diagnosis not present

## 2022-08-16 DIAGNOSIS — L820 Inflamed seborrheic keratosis: Secondary | ICD-10-CM | POA: Diagnosis not present

## 2022-08-18 DIAGNOSIS — M9903 Segmental and somatic dysfunction of lumbar region: Secondary | ICD-10-CM | POA: Diagnosis not present

## 2022-08-18 DIAGNOSIS — M5136 Other intervertebral disc degeneration, lumbar region: Secondary | ICD-10-CM | POA: Diagnosis not present

## 2022-08-22 DIAGNOSIS — Z0000 Encounter for general adult medical examination without abnormal findings: Secondary | ICD-10-CM | POA: Diagnosis not present

## 2022-08-22 DIAGNOSIS — Z9181 History of falling: Secondary | ICD-10-CM | POA: Diagnosis not present

## 2022-08-31 DIAGNOSIS — M9903 Segmental and somatic dysfunction of lumbar region: Secondary | ICD-10-CM | POA: Diagnosis not present

## 2022-08-31 DIAGNOSIS — M5136 Other intervertebral disc degeneration, lumbar region: Secondary | ICD-10-CM | POA: Diagnosis not present

## 2022-09-12 DIAGNOSIS — M9903 Segmental and somatic dysfunction of lumbar region: Secondary | ICD-10-CM | POA: Diagnosis not present

## 2022-09-12 DIAGNOSIS — M5136 Other intervertebral disc degeneration, lumbar region: Secondary | ICD-10-CM | POA: Diagnosis not present

## 2022-09-15 DIAGNOSIS — E7849 Other hyperlipidemia: Secondary | ICD-10-CM | POA: Diagnosis not present

## 2022-09-15 DIAGNOSIS — I10 Essential (primary) hypertension: Secondary | ICD-10-CM | POA: Diagnosis not present

## 2022-09-15 DIAGNOSIS — R002 Palpitations: Secondary | ICD-10-CM | POA: Diagnosis not present

## 2022-09-15 DIAGNOSIS — Z79899 Other long term (current) drug therapy: Secondary | ICD-10-CM | POA: Diagnosis not present

## 2022-09-15 DIAGNOSIS — Z139 Encounter for screening, unspecified: Secondary | ICD-10-CM | POA: Diagnosis not present

## 2022-09-15 DIAGNOSIS — L03316 Cellulitis of umbilicus: Secondary | ICD-10-CM | POA: Diagnosis not present

## 2022-09-15 DIAGNOSIS — F411 Generalized anxiety disorder: Secondary | ICD-10-CM | POA: Diagnosis not present

## 2022-09-15 DIAGNOSIS — E785 Hyperlipidemia, unspecified: Secondary | ICD-10-CM | POA: Diagnosis not present

## 2022-09-21 DIAGNOSIS — M9903 Segmental and somatic dysfunction of lumbar region: Secondary | ICD-10-CM | POA: Diagnosis not present

## 2022-09-21 DIAGNOSIS — M5136 Other intervertebral disc degeneration, lumbar region: Secondary | ICD-10-CM | POA: Diagnosis not present

## 2022-09-27 ENCOUNTER — Encounter (HOSPITAL_BASED_OUTPATIENT_CLINIC_OR_DEPARTMENT_OTHER): Payer: Self-pay | Admitting: Cardiology

## 2022-09-27 ENCOUNTER — Ambulatory Visit (HOSPITAL_BASED_OUTPATIENT_CLINIC_OR_DEPARTMENT_OTHER): Payer: Medicare HMO | Admitting: Cardiology

## 2022-09-27 VITALS — BP 129/74 | HR 63 | Ht 62.5 in | Wt 153.0 lb

## 2022-09-27 DIAGNOSIS — Z8673 Personal history of transient ischemic attack (TIA), and cerebral infarction without residual deficits: Secondary | ICD-10-CM

## 2022-09-27 DIAGNOSIS — M5136 Other intervertebral disc degeneration, lumbar region: Secondary | ICD-10-CM | POA: Diagnosis not present

## 2022-09-27 DIAGNOSIS — T466X5A Adverse effect of antihyperlipidemic and antiarteriosclerotic drugs, initial encounter: Secondary | ICD-10-CM | POA: Diagnosis not present

## 2022-09-27 DIAGNOSIS — E7801 Familial hypercholesterolemia: Secondary | ICD-10-CM | POA: Diagnosis not present

## 2022-09-27 DIAGNOSIS — I10 Essential (primary) hypertension: Secondary | ICD-10-CM

## 2022-09-27 DIAGNOSIS — M791 Myalgia, unspecified site: Secondary | ICD-10-CM

## 2022-09-27 DIAGNOSIS — M9903 Segmental and somatic dysfunction of lumbar region: Secondary | ICD-10-CM | POA: Diagnosis not present

## 2022-09-27 DIAGNOSIS — E78011 Heterozygous familial hypercholesterolemia (hefh): Secondary | ICD-10-CM

## 2022-09-27 DIAGNOSIS — I2510 Atherosclerotic heart disease of native coronary artery without angina pectoris: Secondary | ICD-10-CM

## 2022-09-27 NOTE — Progress Notes (Signed)
Cardiology Office Note:  .   Date:  09/27/2022  ID:  Sharon Marsh, DOB April 07, 1945, MRN [id number] PCP: Hurshel Party, NP  [facility] Providers Cardiologist:  Jodelle Red, MD {  History of Present Illness: .   Sharon Marsh is a 77 y.o. female with a hx of hypertension, dyslipidemia, obesity, elevated coronary calcium score who is seen for follow up today. I initially met her 01/10/20 as a new consult at the request of Moon, Amy A, NP for the evaluation and management of chest pain. She was previously followed by Dr. Nelwyn [facility], last visit 10/08/19.   Pertinent CV history: Ca score 226 (74th percentile) 10/01/19  Today: Micah Flesher to wrong office initially, BP elevated as she is stressed. Staying active, does line dancing about once/week. Feels that she pushes herself with dancing and does not have symptoms. Otherwise minimal activity due to joint pain.  No chest pain with activity. Had mild right sided chest pain once after eating when she had missed her prilosec. Lasted about 5 minutes, no other symptoms at the time.  ROS: Denies chest pain, shortness of breath at rest or with normal exertion. No PND, orthopnea, LE edema or unexpected weight gain. No syncope or palpitations. ROS otherwise negative except as noted.   Studies Reviewed: Marland Kitchen    EKG:  EKG Interpretation Date/Time:  Tuesday September 27 2022 14:33:45 EDT Ventricular Rate:  63 PR Interval:  146 QRS Duration:  74 QT Interval:  412 QTC Calculation: 421 R Axis:   32  Text Interpretation: Normal sinus rhythm Normal ECG Confirmed by Jodelle Red [phone]) on 09/27/2022 2:39:56 PM    Physical Exam:   VS:  BP (!) 168/90   Pulse 63   Ht 5' 2.5" (1.588 m)   Wt 153 lb (69.4 kg)   SpO2 97%   BMI 27.54 kg/m    Wt Readings from Last 3 Encounters:  09/27/22 153 lb (69.4 kg)  09/16/21 155 lb 3.2 oz (70.4 kg)  02/28/21 166 lb (75.3 kg)    GEN: Well nourished, well developed in no acute distress HEENT:  Normal, moist mucous membranes NECK: No JVD CARDIAC: regular rhythm, normal S1 and S2, no rubs or gallops. No murmur. VASCULAR: Radial and DP pulses 2+ bilaterally. No carotid bruits RESPIRATORY:  Clear to auscultation without rales, wheezing or rhonchi  ABDOMEN: Soft, non-tender, non-distended MUSCULOSKELETAL:  Ambulates independently SKIN: Warm and dry, no edema NEUROLOGIC:  Alert and oriented x 3. No focal neuro deficits noted. PSYCHIATRIC:  Normal affect    ASSESSMENT AND PLAN: .    Elevated coronary calcium score, consistent with nonobstructive CAD Elevated LDL, consistent with heterozygous familial hypercholesterolemia History of TIA Mixed hyperlipidemia -no longer taking aspirin 81 mg daily per personal preference. She had significant bruising on this. -she has been intolerant of multiple statins. Had myalgia, palpitations with statins in the past. Atorvastatin gave her severe myalgia. Given CAD, elevated numbers, we discussed PCSK9i. She is hesitant about this. -I again emphasized that she is high risk with an LDL >190 and an elevated calcium score. Reviewed recommendations and guidelines. She is very nervous as she has had issues with side effects on multiple other medications -instructed on red flag warning signs that need immediate medical attention  Reviewed recent lipid numbers. Tchol 300, HDL 49, LDL 206, TG 228.   Hypertension -goal <130/80 -above goal in office, reports better control at home -did not tolerate amlodipine.  -continue metoprolol -follow up with her PCP  CV  risk counseling and prevention -recommend heart healthy/Mediterranean diet, with whole grains, fruits, vegetable, fish, lean meats, nuts, and olive oil. Limit salt. -recommend moderate walking, 3-5 times/week for 30-50 minutes each session. Aim for at least 150 minutes.week. Goal should be pace of 3 miles/hours, or walking 1.5 miles in 30 minutes -recommend avoidance of tobacco products. Avoid excess  alcohol.  Dispo: 1 year  Signed, Jodelle Red, MD   Jodelle Red, MD, PhD, [facility] [place]  [facility]  [place]  [facility] at [facility] [address], [address] [place], [place] [address] [phone]

## 2022-09-27 NOTE — Patient Instructions (Signed)
Medication Instructions:  Your physician recommends that you continue on your current medications as directed. Please refer to the Current Medication list given to you today.  Follow-Up: At [facility], you and your health needs are our priority.  As part of our continuing mission to provide you with exceptional heart care, we have created designated Provider Care Teams.  These Care Teams include your primary Cardiologist (physician) and Advanced Practice Providers (APPs -  Physician Assistants and Nurse Practitioners) who all work together to provide you with the care you need, when you need it.  We recommend signing up for the patient portal called "MyChart".  Sign up information is provided on this After Visit Summary.  MyChart is used to connect with patients for Virtual Visits (Telemedicine).  Patients are able to view lab/test results, encounter notes, upcoming appointments, etc.  Non-urgent messages can be sent to your provider as well.   To learn more about what you can do with MyChart, go to [email or web address].    Your next appointment:   1 year with Dr. Cristal Deer

## 2022-11-29 DIAGNOSIS — M5136 Other intervertebral disc degeneration, lumbar region with discogenic back pain only: Secondary | ICD-10-CM | POA: Diagnosis not present

## 2022-11-29 DIAGNOSIS — M9903 Segmental and somatic dysfunction of lumbar region: Secondary | ICD-10-CM | POA: Diagnosis not present

## 2022-12-15 DIAGNOSIS — M5136 Other intervertebral disc degeneration, lumbar region with discogenic back pain only: Secondary | ICD-10-CM | POA: Diagnosis not present

## 2022-12-15 DIAGNOSIS — M9903 Segmental and somatic dysfunction of lumbar region: Secondary | ICD-10-CM | POA: Diagnosis not present

## 2022-12-22 DIAGNOSIS — M9903 Segmental and somatic dysfunction of lumbar region: Secondary | ICD-10-CM | POA: Diagnosis not present

## 2022-12-22 DIAGNOSIS — M5136 Other intervertebral disc degeneration, lumbar region with discogenic back pain only: Secondary | ICD-10-CM | POA: Diagnosis not present

## 2023-01-12 DIAGNOSIS — M9903 Segmental and somatic dysfunction of lumbar region: Secondary | ICD-10-CM | POA: Diagnosis not present

## 2023-01-12 DIAGNOSIS — M5136 Other intervertebral disc degeneration, lumbar region with discogenic back pain only: Secondary | ICD-10-CM | POA: Diagnosis not present

## 2023-01-17 DIAGNOSIS — M5136 Other intervertebral disc degeneration, lumbar region with discogenic back pain only: Secondary | ICD-10-CM | POA: Diagnosis not present

## 2023-01-17 DIAGNOSIS — M9903 Segmental and somatic dysfunction of lumbar region: Secondary | ICD-10-CM | POA: Diagnosis not present

## 2023-01-27 DIAGNOSIS — B9689 Other specified bacterial agents as the cause of diseases classified elsewhere: Secondary | ICD-10-CM | POA: Diagnosis not present

## 2023-01-27 DIAGNOSIS — I10 Essential (primary) hypertension: Secondary | ICD-10-CM | POA: Diagnosis not present

## 2023-01-27 DIAGNOSIS — J019 Acute sinusitis, unspecified: Secondary | ICD-10-CM | POA: Diagnosis not present

## 2023-01-31 DIAGNOSIS — M9903 Segmental and somatic dysfunction of lumbar region: Secondary | ICD-10-CM | POA: Diagnosis not present

## 2023-01-31 DIAGNOSIS — M5136 Other intervertebral disc degeneration, lumbar region with discogenic back pain only: Secondary | ICD-10-CM | POA: Diagnosis not present

## 2023-02-08 DIAGNOSIS — M5136 Other intervertebral disc degeneration, lumbar region with discogenic back pain only: Secondary | ICD-10-CM | POA: Diagnosis not present

## 2023-02-08 DIAGNOSIS — M9903 Segmental and somatic dysfunction of lumbar region: Secondary | ICD-10-CM | POA: Diagnosis not present

## 2023-02-16 DIAGNOSIS — I10 Essential (primary) hypertension: Secondary | ICD-10-CM | POA: Diagnosis not present

## 2023-02-16 DIAGNOSIS — R059 Cough, unspecified: Secondary | ICD-10-CM | POA: Diagnosis not present

## 2023-03-02 DIAGNOSIS — M5136 Other intervertebral disc degeneration, lumbar region with discogenic back pain only: Secondary | ICD-10-CM | POA: Diagnosis not present

## 2023-03-02 DIAGNOSIS — M9903 Segmental and somatic dysfunction of lumbar region: Secondary | ICD-10-CM | POA: Diagnosis not present

## 2023-03-13 DIAGNOSIS — M9903 Segmental and somatic dysfunction of lumbar region: Secondary | ICD-10-CM | POA: Diagnosis not present

## 2023-03-13 DIAGNOSIS — M5136 Other intervertebral disc degeneration, lumbar region with discogenic back pain only: Secondary | ICD-10-CM | POA: Diagnosis not present

## 2023-03-16 DIAGNOSIS — M9903 Segmental and somatic dysfunction of lumbar region: Secondary | ICD-10-CM | POA: Diagnosis not present

## 2023-03-16 DIAGNOSIS — M5136 Other intervertebral disc degeneration, lumbar region with discogenic back pain only: Secondary | ICD-10-CM | POA: Diagnosis not present

## 2023-03-17 DIAGNOSIS — N390 Urinary tract infection, site not specified: Secondary | ICD-10-CM | POA: Diagnosis not present

## 2023-03-17 DIAGNOSIS — Z79899 Other long term (current) drug therapy: Secondary | ICD-10-CM | POA: Diagnosis not present

## 2023-03-17 DIAGNOSIS — E7849 Other hyperlipidemia: Secondary | ICD-10-CM | POA: Diagnosis not present

## 2023-03-17 DIAGNOSIS — L03316 Cellulitis of umbilicus: Secondary | ICD-10-CM | POA: Diagnosis not present

## 2023-03-17 DIAGNOSIS — R931 Abnormal findings on diagnostic imaging of heart and coronary circulation: Secondary | ICD-10-CM | POA: Diagnosis not present

## 2023-03-17 DIAGNOSIS — M1990 Unspecified osteoarthritis, unspecified site: Secondary | ICD-10-CM | POA: Diagnosis not present

## 2023-03-17 DIAGNOSIS — I10 Essential (primary) hypertension: Secondary | ICD-10-CM | POA: Diagnosis not present

## 2023-03-17 DIAGNOSIS — R002 Palpitations: Secondary | ICD-10-CM | POA: Diagnosis not present

## 2023-03-17 DIAGNOSIS — E785 Hyperlipidemia, unspecified: Secondary | ICD-10-CM | POA: Diagnosis not present

## 2023-03-17 DIAGNOSIS — M545 Low back pain, unspecified: Secondary | ICD-10-CM | POA: Diagnosis not present

## 2023-03-17 DIAGNOSIS — I2510 Atherosclerotic heart disease of native coronary artery without angina pectoris: Secondary | ICD-10-CM | POA: Diagnosis not present

## 2023-03-17 DIAGNOSIS — G4700 Insomnia, unspecified: Secondary | ICD-10-CM | POA: Diagnosis not present

## 2023-03-22 DIAGNOSIS — M5136 Other intervertebral disc degeneration, lumbar region with discogenic back pain only: Secondary | ICD-10-CM | POA: Diagnosis not present

## 2023-03-22 DIAGNOSIS — M9903 Segmental and somatic dysfunction of lumbar region: Secondary | ICD-10-CM | POA: Diagnosis not present

## 2023-03-28 DIAGNOSIS — M5136 Other intervertebral disc degeneration, lumbar region with discogenic back pain only: Secondary | ICD-10-CM | POA: Diagnosis not present

## 2023-03-28 DIAGNOSIS — M9903 Segmental and somatic dysfunction of lumbar region: Secondary | ICD-10-CM | POA: Diagnosis not present

## 2023-04-04 DIAGNOSIS — H35362 Drusen (degenerative) of macula, left eye: Secondary | ICD-10-CM | POA: Diagnosis not present

## 2023-04-04 DIAGNOSIS — H04123 Dry eye syndrome of bilateral lacrimal glands: Secondary | ICD-10-CM | POA: Diagnosis not present

## 2023-04-04 DIAGNOSIS — M5136 Other intervertebral disc degeneration, lumbar region with discogenic back pain only: Secondary | ICD-10-CM | POA: Diagnosis not present

## 2023-04-04 DIAGNOSIS — M9903 Segmental and somatic dysfunction of lumbar region: Secondary | ICD-10-CM | POA: Diagnosis not present

## 2023-04-04 DIAGNOSIS — H35033 Hypertensive retinopathy, bilateral: Secondary | ICD-10-CM | POA: Diagnosis not present

## 2023-04-04 DIAGNOSIS — H538 Other visual disturbances: Secondary | ICD-10-CM | POA: Diagnosis not present

## 2023-04-04 DIAGNOSIS — H26493 Other secondary cataract, bilateral: Secondary | ICD-10-CM | POA: Diagnosis not present

## 2023-04-10 DIAGNOSIS — M5136 Other intervertebral disc degeneration, lumbar region with discogenic back pain only: Secondary | ICD-10-CM | POA: Diagnosis not present

## 2023-04-10 DIAGNOSIS — M9903 Segmental and somatic dysfunction of lumbar region: Secondary | ICD-10-CM | POA: Diagnosis not present

## 2023-04-17 DIAGNOSIS — M9903 Segmental and somatic dysfunction of lumbar region: Secondary | ICD-10-CM | POA: Diagnosis not present

## 2023-04-17 DIAGNOSIS — M5136 Other intervertebral disc degeneration, lumbar region with discogenic back pain only: Secondary | ICD-10-CM | POA: Diagnosis not present

## 2023-05-01 DIAGNOSIS — M9903 Segmental and somatic dysfunction of lumbar region: Secondary | ICD-10-CM | POA: Diagnosis not present

## 2023-05-01 DIAGNOSIS — M5136 Other intervertebral disc degeneration, lumbar region with discogenic back pain only: Secondary | ICD-10-CM | POA: Diagnosis not present

## 2023-05-08 DIAGNOSIS — R319 Hematuria, unspecified: Secondary | ICD-10-CM | POA: Diagnosis not present

## 2023-05-08 DIAGNOSIS — Z1231 Encounter for screening mammogram for malignant neoplasm of breast: Secondary | ICD-10-CM | POA: Diagnosis not present

## 2023-05-11 DIAGNOSIS — M9903 Segmental and somatic dysfunction of lumbar region: Secondary | ICD-10-CM | POA: Diagnosis not present

## 2023-05-11 DIAGNOSIS — M5136 Other intervertebral disc degeneration, lumbar region with discogenic back pain only: Secondary | ICD-10-CM | POA: Diagnosis not present

## 2023-05-31 DIAGNOSIS — M9903 Segmental and somatic dysfunction of lumbar region: Secondary | ICD-10-CM | POA: Diagnosis not present

## 2023-05-31 DIAGNOSIS — M5136 Other intervertebral disc degeneration, lumbar region with discogenic back pain only: Secondary | ICD-10-CM | POA: Diagnosis not present

## 2023-06-03 DIAGNOSIS — E785 Hyperlipidemia, unspecified: Secondary | ICD-10-CM | POA: Diagnosis not present

## 2023-06-03 DIAGNOSIS — Z888 Allergy status to other drugs, medicaments and biological substances status: Secondary | ICD-10-CM | POA: Diagnosis not present

## 2023-06-03 DIAGNOSIS — Z881 Allergy status to other antibiotic agents status: Secondary | ICD-10-CM | POA: Diagnosis not present

## 2023-06-03 DIAGNOSIS — R079 Chest pain, unspecified: Secondary | ICD-10-CM | POA: Diagnosis not present

## 2023-06-03 DIAGNOSIS — I214 Non-ST elevation (NSTEMI) myocardial infarction: Secondary | ICD-10-CM | POA: Diagnosis not present

## 2023-06-03 DIAGNOSIS — D509 Iron deficiency anemia, unspecified: Secondary | ICD-10-CM | POA: Diagnosis not present

## 2023-06-03 DIAGNOSIS — Z91040 Latex allergy status: Secondary | ICD-10-CM | POA: Diagnosis not present

## 2023-06-03 DIAGNOSIS — I2510 Atherosclerotic heart disease of native coronary artery without angina pectoris: Secondary | ICD-10-CM | POA: Diagnosis not present

## 2023-06-03 DIAGNOSIS — R1111 Vomiting without nausea: Secondary | ICD-10-CM | POA: Diagnosis not present

## 2023-06-03 DIAGNOSIS — E7800 Pure hypercholesterolemia, unspecified: Secondary | ICD-10-CM | POA: Diagnosis not present

## 2023-06-03 DIAGNOSIS — M1990 Unspecified osteoarthritis, unspecified site: Secondary | ICD-10-CM | POA: Diagnosis not present

## 2023-06-03 DIAGNOSIS — Z79899 Other long term (current) drug therapy: Secondary | ICD-10-CM | POA: Diagnosis not present

## 2023-06-03 DIAGNOSIS — R0781 Pleurodynia: Secondary | ICD-10-CM | POA: Diagnosis not present

## 2023-06-03 DIAGNOSIS — I10 Essential (primary) hypertension: Secondary | ICD-10-CM | POA: Diagnosis not present

## 2023-06-03 DIAGNOSIS — I160 Hypertensive urgency: Secondary | ICD-10-CM | POA: Diagnosis not present

## 2023-06-03 DIAGNOSIS — R0602 Shortness of breath: Secondary | ICD-10-CM | POA: Diagnosis not present

## 2023-06-03 DIAGNOSIS — R110 Nausea: Secondary | ICD-10-CM | POA: Diagnosis not present

## 2023-06-03 DIAGNOSIS — R000 Tachycardia, unspecified: Secondary | ICD-10-CM | POA: Diagnosis not present

## 2023-06-03 DIAGNOSIS — Z8673 Personal history of transient ischemic attack (TIA), and cerebral infarction without residual deficits: Secondary | ICD-10-CM | POA: Diagnosis not present

## 2023-06-03 DIAGNOSIS — Z87891 Personal history of nicotine dependence: Secondary | ICD-10-CM | POA: Diagnosis not present

## 2023-06-04 DIAGNOSIS — I2510 Atherosclerotic heart disease of native coronary artery without angina pectoris: Secondary | ICD-10-CM | POA: Diagnosis not present

## 2023-06-04 DIAGNOSIS — I214 Non-ST elevation (NSTEMI) myocardial infarction: Secondary | ICD-10-CM | POA: Diagnosis not present

## 2023-06-04 DIAGNOSIS — R079 Chest pain, unspecified: Secondary | ICD-10-CM | POA: Diagnosis not present

## 2023-06-04 DIAGNOSIS — I10 Essential (primary) hypertension: Secondary | ICD-10-CM | POA: Diagnosis not present

## 2023-06-04 DIAGNOSIS — R0781 Pleurodynia: Secondary | ICD-10-CM | POA: Diagnosis not present

## 2023-06-04 DIAGNOSIS — E785 Hyperlipidemia, unspecified: Secondary | ICD-10-CM | POA: Diagnosis not present

## 2023-06-05 DIAGNOSIS — R0781 Pleurodynia: Secondary | ICD-10-CM | POA: Diagnosis not present

## 2023-06-05 DIAGNOSIS — R079 Chest pain, unspecified: Secondary | ICD-10-CM | POA: Diagnosis not present

## 2023-06-05 DIAGNOSIS — I214 Non-ST elevation (NSTEMI) myocardial infarction: Secondary | ICD-10-CM | POA: Diagnosis not present

## 2023-06-05 DIAGNOSIS — I10 Essential (primary) hypertension: Secondary | ICD-10-CM | POA: Diagnosis not present

## 2023-06-05 DIAGNOSIS — I2510 Atherosclerotic heart disease of native coronary artery without angina pectoris: Secondary | ICD-10-CM | POA: Diagnosis not present

## 2023-06-05 DIAGNOSIS — E785 Hyperlipidemia, unspecified: Secondary | ICD-10-CM | POA: Diagnosis not present

## 2023-06-06 ENCOUNTER — Encounter (HOSPITAL_COMMUNITY): Payer: Self-pay | Admitting: Internal Medicine

## 2023-06-06 ENCOUNTER — Inpatient Hospital Stay (HOSPITAL_COMMUNITY)
Admission: EM | Admit: 2023-06-06 | Discharge: 2023-06-07 | DRG: 324 | Disposition: A | Discharge status: Home / Self Care | Attending: Internal Medicine | Admitting: Internal Medicine

## 2023-06-06 ENCOUNTER — Encounter (HOSPITAL_COMMUNITY)
Admission: EM | Disposition: A | Discharge status: Home / Self Care | Payer: Self-pay | Source: Home / Self Care | Attending: Internal Medicine

## 2023-06-06 DIAGNOSIS — I214 Non-ST elevation (NSTEMI) myocardial infarction: Secondary | ICD-10-CM | POA: Diagnosis not present

## 2023-06-06 DIAGNOSIS — Z91040 Latex allergy status: Secondary | ICD-10-CM

## 2023-06-06 DIAGNOSIS — I160 Hypertensive urgency: Secondary | ICD-10-CM | POA: Diagnosis not present

## 2023-06-06 DIAGNOSIS — R931 Abnormal findings on diagnostic imaging of heart and coronary circulation: Secondary | ICD-10-CM | POA: Diagnosis present

## 2023-06-06 DIAGNOSIS — I249 Acute ischemic heart disease, unspecified: Principal | ICD-10-CM

## 2023-06-06 DIAGNOSIS — Z881 Allergy status to other antibiotic agents status: Secondary | ICD-10-CM | POA: Diagnosis not present

## 2023-06-06 DIAGNOSIS — M170 Bilateral primary osteoarthritis of knee: Secondary | ICD-10-CM | POA: Diagnosis not present

## 2023-06-06 DIAGNOSIS — E7800 Pure hypercholesterolemia, unspecified: Secondary | ICD-10-CM | POA: Diagnosis not present

## 2023-06-06 DIAGNOSIS — M51369 Other intervertebral disc degeneration, lumbar region without mention of lumbar back pain or lower extremity pain: Secondary | ICD-10-CM | POA: Diagnosis present

## 2023-06-06 DIAGNOSIS — G4700 Insomnia, unspecified: Secondary | ICD-10-CM | POA: Diagnosis not present

## 2023-06-06 DIAGNOSIS — R0683 Snoring: Secondary | ICD-10-CM | POA: Diagnosis not present

## 2023-06-06 DIAGNOSIS — Z79899 Other long term (current) drug therapy: Secondary | ICD-10-CM

## 2023-06-06 DIAGNOSIS — Z8249 Family history of ischemic heart disease and other diseases of the circulatory system: Secondary | ICD-10-CM

## 2023-06-06 DIAGNOSIS — I471 Supraventricular tachycardia, unspecified: Secondary | ICD-10-CM | POA: Diagnosis not present

## 2023-06-06 DIAGNOSIS — K219 Gastro-esophageal reflux disease without esophagitis: Secondary | ICD-10-CM | POA: Diagnosis not present

## 2023-06-06 DIAGNOSIS — Z955 Presence of coronary angioplasty implant and graft: Secondary | ICD-10-CM

## 2023-06-06 DIAGNOSIS — Z87891 Personal history of nicotine dependence: Secondary | ICD-10-CM | POA: Diagnosis not present

## 2023-06-06 DIAGNOSIS — Z888 Allergy status to other drugs, medicaments and biological substances status: Secondary | ICD-10-CM | POA: Diagnosis not present

## 2023-06-06 DIAGNOSIS — Z6828 Body mass index (BMI) 28.0-28.9, adult: Secondary | ICD-10-CM | POA: Diagnosis not present

## 2023-06-06 DIAGNOSIS — I2584 Coronary atherosclerosis due to calcified coronary lesion: Secondary | ICD-10-CM | POA: Diagnosis not present

## 2023-06-06 DIAGNOSIS — E7801 Familial hypercholesterolemia: Secondary | ICD-10-CM | POA: Diagnosis present

## 2023-06-06 DIAGNOSIS — E669 Obesity, unspecified: Secondary | ICD-10-CM | POA: Diagnosis present

## 2023-06-06 DIAGNOSIS — I472 Ventricular tachycardia, unspecified: Secondary | ICD-10-CM | POA: Diagnosis not present

## 2023-06-06 DIAGNOSIS — Z90710 Acquired absence of both cervix and uterus: Secondary | ICD-10-CM | POA: Diagnosis not present

## 2023-06-06 DIAGNOSIS — G4733 Obstructive sleep apnea (adult) (pediatric): Secondary | ICD-10-CM | POA: Diagnosis not present

## 2023-06-06 DIAGNOSIS — I4729 Other ventricular tachycardia: Secondary | ICD-10-CM | POA: Diagnosis not present

## 2023-06-06 DIAGNOSIS — G609 Hereditary and idiopathic neuropathy, unspecified: Secondary | ICD-10-CM | POA: Diagnosis present

## 2023-06-06 DIAGNOSIS — I10 Essential (primary) hypertension: Secondary | ICD-10-CM | POA: Diagnosis not present

## 2023-06-06 DIAGNOSIS — Z8673 Personal history of transient ischemic attack (TIA), and cerebral infarction without residual deficits: Secondary | ICD-10-CM | POA: Diagnosis not present

## 2023-06-06 DIAGNOSIS — M1990 Unspecified osteoarthritis, unspecified site: Secondary | ICD-10-CM | POA: Diagnosis not present

## 2023-06-06 DIAGNOSIS — D509 Iron deficiency anemia, unspecified: Secondary | ICD-10-CM | POA: Diagnosis not present

## 2023-06-06 DIAGNOSIS — R079 Chest pain, unspecified: Secondary | ICD-10-CM | POA: Diagnosis not present

## 2023-06-06 DIAGNOSIS — Z87412 Personal history of vulvar dysplasia: Secondary | ICD-10-CM | POA: Diagnosis not present

## 2023-06-06 DIAGNOSIS — R0781 Pleurodynia: Secondary | ICD-10-CM | POA: Diagnosis not present

## 2023-06-06 DIAGNOSIS — I2510 Atherosclerotic heart disease of native coronary artery without angina pectoris: Secondary | ICD-10-CM | POA: Diagnosis present

## 2023-06-06 HISTORY — PX: CORONARY STENT INTERVENTION: CATH118234

## 2023-06-06 HISTORY — PX: LEFT HEART CATH AND CORONARY ANGIOGRAPHY: CATH118249

## 2023-06-06 HISTORY — PX: CORONARY LITHOTRIPSY: CATH118330

## 2023-06-06 HISTORY — PX: CORONARY IMAGING/OCT: CATH118326

## 2023-06-06 LAB — POCT ACTIVATED CLOTTING TIME
Activated Clotting Time: 291 s
Activated Clotting Time: 291 s
Activated Clotting Time: 291 s
Activated Clotting Time: 446 s
Activated Clotting Time: 464 s
Activated Clotting Time: 256 s

## 2023-06-06 SURGERY — LEFT HEART CATH AND CORONARY ANGIOGRAPHY
Anesthesia: LOCAL

## 2023-06-06 MED ORDER — MORPHINE SULFATE (PF) 2 MG/ML IV SOLN
INTRAVENOUS | Status: AC
  Filled 2023-06-06: qty 1

## 2023-06-06 MED ORDER — HYDRALAZINE HCL 20 MG/ML IJ SOLN
INTRAMUSCULAR | Status: AC
  Filled 2023-06-06: qty 1

## 2023-06-06 MED ORDER — SIMVASTATIN 20 MG PO TABS: 10.0000 mg | ORAL_TABLET | Freq: Every day | ORAL | Status: DC

## 2023-06-06 MED ORDER — HEPARIN SODIUM (PORCINE) 1000 UNIT/ML IJ SOLN
INTRAMUSCULAR | Status: AC
  Filled 2023-06-06: qty 10

## 2023-06-06 MED ORDER — HYDRALAZINE HCL 20 MG/ML IJ SOLN
INTRAMUSCULAR | Status: DC | PRN
  Administered 2023-06-06: 10 mg via INTRAVENOUS

## 2023-06-06 MED ORDER — IOHEXOL 350 MG/ML SOLN
INTRAVENOUS | Status: DC | PRN
  Administered 2023-06-06: 115 mL

## 2023-06-06 MED ORDER — MIDAZOLAM HCL 2 MG/2ML IJ SOLN
INTRAMUSCULAR | Status: DC | PRN
  Administered 2023-06-06 (×3): 1 mg via INTRAVENOUS

## 2023-06-06 MED ORDER — SODIUM CHLORIDE 0.9% FLUSH
3.0000 mL | Freq: Two times a day (BID) | INTRAVENOUS | Status: DC
  Administered 2023-06-06 – 2023-06-07 (×2): 3 mL via INTRAVENOUS

## 2023-06-06 MED ORDER — TICAGRELOR 90 MG PO TABS
90.0000 mg | ORAL_TABLET | Freq: Two times a day (BID) | ORAL | Status: DC
  Administered 2023-06-06 – 2023-06-07 (×2): 90 mg via ORAL
  Filled 2023-06-06 (×2): qty 1

## 2023-06-06 MED ORDER — VERAPAMIL HCL 2.5 MG/ML IV SOLN
INTRAVENOUS | Status: AC
  Filled 2023-06-06: qty 2

## 2023-06-06 MED ORDER — SODIUM CHLORIDE 0.9 % IV SOLN: INTRAVENOUS | Status: DC

## 2023-06-06 MED ORDER — MORPHINE SULFATE (PF) 2 MG/ML IV SOLN: 1.0000 mg | INTRAVENOUS | Status: DC | PRN

## 2023-06-06 MED ORDER — HYDRALAZINE HCL 20 MG/ML IJ SOLN: 10.0000 mg | INTRAMUSCULAR | Status: AC | PRN

## 2023-06-06 MED ORDER — HEPARIN SODIUM (PORCINE) 1000 UNIT/ML IJ SOLN
INTRAMUSCULAR | Status: AC
Start: 2023-06-06 — End: ?
  Filled 2023-06-06: qty 10

## 2023-06-06 MED ORDER — HEPARIN SODIUM (PORCINE) 1000 UNIT/ML IJ SOLN
INTRAMUSCULAR | Status: DC | PRN
  Administered 2023-06-06: 5000 [IU] via INTRAVENOUS
  Administered 2023-06-06: 3000 [IU] via INTRAVENOUS
  Administered 2023-06-06: 1000 [IU] via INTRAVENOUS

## 2023-06-06 MED ORDER — METOPROLOL TARTRATE 50 MG PO TABS: 50.0000 mg | ORAL_TABLET | Freq: Two times a day (BID) | ORAL | Status: DC

## 2023-06-06 MED ORDER — LIDOCAINE HCL (PF) 1 % IJ SOLN
INTRAMUSCULAR | Status: DC | PRN
  Administered 2023-06-06: 2 mL

## 2023-06-06 MED ORDER — LOSARTAN POTASSIUM 25 MG PO TABS
25.0000 mg | ORAL_TABLET | Freq: Every day | ORAL | Status: DC
  Administered 2023-06-07: 25 mg via ORAL
  Filled 2023-06-06: qty 1

## 2023-06-06 MED ORDER — SODIUM CHLORIDE 0.9 % IV SOLN: INTRAVENOUS | Status: AC

## 2023-06-06 MED ORDER — TICAGRELOR 90 MG PO TABS
ORAL_TABLET | ORAL | Status: DC | PRN
  Administered 2023-06-06: 180 mg via ORAL

## 2023-06-06 MED ORDER — HEPARIN (PORCINE) IN NACL 1000-0.9 UT/500ML-% IV SOLN
INTRAVENOUS | Status: DC | PRN
  Administered 2023-06-06 (×2): 500 mL

## 2023-06-06 MED ORDER — MIDAZOLAM HCL 2 MG/2ML IJ SOLN
INTRAMUSCULAR | Status: AC
Start: 2023-06-06 — End: ?
  Filled 2023-06-06: qty 2

## 2023-06-06 MED ORDER — ONDANSETRON HCL 4 MG/2ML IJ SOLN
4.0000 mg | Freq: Four times a day (QID) | INTRAMUSCULAR | Status: DC | PRN
  Administered 2023-06-06: 4 mg via INTRAVENOUS
  Filled 2023-06-06: qty 2

## 2023-06-06 MED ORDER — MORPHINE SULFATE (PF) 2 MG/ML IV SOLN
INTRAVENOUS | Status: DC | PRN
  Administered 2023-06-06: 1 mg via INTRAVENOUS

## 2023-06-06 MED ORDER — MIDAZOLAM HCL 2 MG/2ML IJ SOLN
INTRAMUSCULAR | Status: AC
  Filled 2023-06-06: qty 2

## 2023-06-06 MED ORDER — LABETALOL HCL 5 MG/ML IV SOLN: 10.0000 mg | INTRAVENOUS | Status: AC | PRN

## 2023-06-06 MED ORDER — SODIUM CHLORIDE 0.9% FLUSH: 3.0000 mL | INTRAVENOUS | Status: DC | PRN

## 2023-06-06 MED ORDER — VERAPAMIL HCL 2.5 MG/ML IV SOLN
INTRAVENOUS | Status: AC
Start: 2023-06-06 — End: ?
  Filled 2023-06-06: qty 2

## 2023-06-06 MED ORDER — FENTANYL CITRATE (PF) 100 MCG/2ML IJ SOLN
INTRAMUSCULAR | Status: DC | PRN
  Administered 2023-06-06 (×3): 25 ug via INTRAVENOUS

## 2023-06-06 MED ORDER — LIDOCAINE HCL (PF) 1 % IJ SOLN
INTRAMUSCULAR | Status: AC
  Filled 2023-06-06: qty 30

## 2023-06-06 MED ORDER — VERAPAMIL HCL 2.5 MG/ML IV SOLN
INTRAVENOUS | Status: DC | PRN
  Administered 2023-06-06: 10 mL via INTRA_ARTERIAL

## 2023-06-06 MED ORDER — ASPIRIN 81 MG PO CHEW
81.0000 mg | CHEWABLE_TABLET | Freq: Every day | ORAL | Status: DC
  Administered 2023-06-07: 81 mg via ORAL
  Filled 2023-06-06: qty 1

## 2023-06-06 MED ORDER — FENTANYL CITRATE (PF) 100 MCG/2ML IJ SOLN
INTRAMUSCULAR | Status: AC
Start: 2023-06-06 — End: ?
  Filled 2023-06-06: qty 2

## 2023-06-06 MED ORDER — TICAGRELOR 90 MG PO TABS
ORAL_TABLET | ORAL | Status: AC
  Filled 2023-06-06: qty 2

## 2023-06-06 MED ORDER — ACETAMINOPHEN 325 MG PO TABS
650.0000 mg | ORAL_TABLET | ORAL | Status: DC | PRN
  Administered 2023-06-07: 650 mg via ORAL
  Filled 2023-06-06: qty 2

## 2023-06-06 MED ORDER — SODIUM CHLORIDE 0.9 % IV SOLN: 250.0000 mL | INTRAVENOUS | Status: DC | PRN

## 2023-06-06 SURGICAL SUPPLY — 20 items
BALLOON EMERGE MR 2.5X15 (BALLOONS) IMPLANT
BALLOON WOLVERINE 2.50X15 (BALLOONS) IMPLANT
BALLOON ~~LOC~~ EMERGE MR 2.75X15 (BALLOONS) IMPLANT
CARD KEY FFR CATHWORX (MISCELLANEOUS) IMPLANT
CATH DRAGONFLY OPSTAR (CATHETERS) IMPLANT
CATH INFINITI 5FR ANG PIGTAIL (CATHETERS) IMPLANT
CATH INFINITI AMBI 6FR TG (CATHETERS) IMPLANT
CATH LAUNCHER 6FR EBU3.5 (CATHETERS) IMPLANT
CATH SHOCKWAVE 2.5X12 (CATHETERS) IMPLANT
DEVICE RAD COMP TR BAND LRG (VASCULAR PRODUCTS) IMPLANT
GLIDESHEATH SLEND SS 6F .021 (SHEATH) IMPLANT
GUIDEWIRE VAS SION BLUE 190 (WIRE) IMPLANT
KIT ENCORE 26 ADVANTAGE (KITS) IMPLANT
KIT HEMO VALVE WATCHDOG (MISCELLANEOUS) IMPLANT
KIT SINGLE USE MANIFOLD (KITS) IMPLANT
PACK CARDIAC CATHETERIZATION (CUSTOM PROCEDURE TRAY) ×1 IMPLANT
SET ATX-X65L (MISCELLANEOUS) IMPLANT
STENT SYNERGY XD 2.75X32 (Permanent Stent) IMPLANT
WIRE ASAHI PROWATER 180CM (WIRE) IMPLANT
WIRE EMERALD 3MM-J .035X260CM (WIRE) IMPLANT

## 2023-06-06 NOTE — Progress Notes (Signed)
 Paged by RN that systolic blood pressures were ~132 mmHg. Her baseline blood pressures were higher throughout the day with systolics in the 160s. We increased her post cath fluids from 50ml/hr to 100 ml/hour for 5 hours total.   Velvet Gibbs, MD MS  Cardiology [facility]

## 2023-06-06 NOTE — Interval H&P Note (Signed)
 History and Physical Interval Note:  06/06/2023 2:45 PM  Sharon Marsh  has presented today for surgery, with the diagnosis of nstemi.  The various methods of treatment have been discussed with the patient and family. After consideration of risks, benefits and other options for treatment, the patient has consented to  Procedure(s): LEFT HEART CATH AND CORONARY ANGIOGRAPHY (N/A) as a surgical intervention.  The patient's history has been reviewed, patient examined, no change in status, stable for surgery.  I have reviewed the patient's chart and labs.  Questions were answered to the patient's satisfaction.     Jazz Rogala K Oneal Schoenberger

## 2023-06-06 NOTE — Progress Notes (Signed)
 78 year old female arrived via [facility] from [facility] with chest pain starting 5/31, heparin running at 7.5, BMP 808, Troponin +, hx of HTN, Anxiety and arthritis. Daughter in waiting room.

## 2023-06-06 NOTE — H&P (Addendum)
 Cardiology Admission History and Physical   Patient ID: TAYVIA FAUGHNAN MRN: [id number]; DOB: 06/06/1945   Admission date: 06/06/2023  PCP:  Olga Berthold, NP   [facility] Providers Cardiologist:  Sheryle Donning, MD    Chief Complaint:  Chest Pain      Patient Profile: Sharon Marsh is a 78 y.o. female with a past medical history of hypertension, HLD, obesity, elevated calcium  score who is being seen 06/06/2023 for the evaluation of chest pain.     History of Present Illness: Sharon Marsh is a 78 year old female with above medical history who is followed by Dr. Veryl Gottron. Per chart review, patient previously had an echocardiogram in 07/2019 that showed EF 60-65%, no regional wall motion abnormalities, mild LVH, grade I DD, normal RV systolic function, mild MR, mild AI. Also underwent coronary calcium  scoring in 09/2019 that showed a coronary calcium  score of 226 (74th percentile). Later, a cardiac monitor in 07/2020 showed predominantly NSR, 8 runs of SVT with the longest run lasting 12 beats. There were no VT, afib, high degree block, or pauses noted.   Patient was last seen by Dr. Veryl Gottron on 09/27/22. At that time, patient was doing well. Denied chest pain, shortness of breath, syncope, palpitations, LE edema. She had stopped taking ASA 81 mg because she reported significant bruising on it. A recent lipid panel showed LDL 206. She had previously had intolerances of statins, and refused to start a PCSK9i  Patient presented to [facility] ED on 5/31 complaining of mid-right sided chest pain with SOB and nausea. Symptoms had started about 1 hour prior to her presentation. Reported feeling SOB because it was difficult to take a deep breath due to the pain. Overall, chest pain sounded pleuritic. Her BP was elevated to 199/77.   Labs in the ED significant for creatinine 1.00, K 3.8, LFTs within normal limits. Hemoglobin 11.8, WBC 5.3. Troponin I 0.02>0.38>0.61>0.62>0.25.   CXR showed no focal consolidations. CTA chest showed no evidence of PE, ground-glass densities in the lower lobes that may be consistent with cardiogenic pulmonary edema.   Patient was admitted to [facility]. Treated with NSTEMI IV heparin and started on ASA 81 mg daily. Started on metoprolol succinate 50 mg BID, losartan 25 mg BID. Also started on simvastatin 10 mg daily. Cardiology saw patient while at [facility] and they recommended cardiac catheterization. Echocardiogram at [facility] showed EF 55-60%, normal wall motion, normal RV size and systolic function, no significant valvular abnormalities.     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   Obstructive sleep apnea    Osteoarthritis of left knee 08/09/2017   Osteoarthritis of right knee 10/03/2018   Osteoporosis 02/12/2019   Other and unspecified hyperlipidemia    Personal history of vulvar dysplasia    PONV (postoperative nausea and vomiting)    Unspecified essential hypertension 08/15/2013   Unspecified hereditary and idiopathic peripheral neuropathy 08/15/2013   Past Surgical History:  Procedure Laterality Date   ABDOMINAL HYSTERECTOMY     APPENDECTOMY  1950   CATARACT EXTRACTION Bilateral 2008   KNEE SURGERY Left 2018   Arthroscopic knee surg for torn meniscus   THYROID  SURGERY  1980   VULVECTOMY N/A 06/22/2012   Procedure: WIDE EXCISION VULVECTOMY;  Surgeon: Atlas Blank, DO;  Location: [facility];  Service: Gynecology;  Laterality: N/A;  wide local excision of vulva   wide local excision high grade VIN  06/22/2012   Dr Dene Fines     Medications Prior to Admission: Prior to Admission medications   Medication Sig Start Date End Date Taking?  Authorizing Provider  Acetylcysteine 600 MG CAPS Take by mouth daily at 6 (six) AM. 12/03/19   [provider]  Ascorbic Acid 1000 MG CHEW Chew 1,000 mg by mouth daily.    [provider]  b complex vitamins tablet Take 1 tablet by mouth daily.    [provider]  cholecalciferol (VITAMIN D3) 25 MCG (1000 UNIT) tablet Take 1,000 Units by mouth daily.    [provider]  Coenzyme Q10 200 MG capsule Take by mouth daily. 10/08/19   [provider]  gabapentin (NEURONTIN) 100 MG capsule as needed (pain). 12/24/21   [provider]  ibuprofen (ADVIL) 800 MG tablet Take 800 mg by mouth every 6 (six) hours as needed.    [provider]  ipratropium (ATROVENT) 0.06 % nasal spray Place into the nose as needed.    [provider]  metoprolol tartrate (LOPRESSOR) 50 MG tablet Take 50 mg by mouth 2 (two) times daily. 04/04/21   [provider]  nystatin cream (MYCOSTATIN) as needed (yeast infection).    [provider]  omeprazole (PRILOSEC) 20 MG capsule Take 20 mg by mouth daily.    [provider]  zolpidem (AMBIEN) 10 MG tablet Take 10 mg by mouth at bedtime as needed for sleep.    [provider]     Allergies:    Allergies  Allergen Reactions   Clindamycin Other (See Comments)   Clindamycin/Lincomycin Hypertension   Doxycycline     Irritates Hernia   Latex Other (See Comments)    Patient states that certain latex bandades causes irritation to her skin    Social History:   Social History   Socioeconomic History   Marital status: Widowed    Spouse name: Not on file   Number of children: 2   Years of education: college   Highest education level: Not on file  Occupational History   Not on file  Tobacco Use   Smoking status: Former   Smokeless tobacco: Never  Vaping Use   Vaping status: Never Used  Substance and Sexual Activity   Alcohol use: Yes    Comment: socially "very little"    Drug use: No   Sexual activity: Yes    Birth control/protection: None  Other Topics Concern   Not on file  Social History Narrative   Patient is widowed with 2 children (1 of them passed in 2012).   Patient is right handed.   Patient has some college.   Caffeine: stopped    Social Drivers of Corporate investment banker Strain: Not on file  Food Insecurity: Not on file  Transportation Needs: Not on file  Physical Activity: Not on file  Stress: Not on file  Social Connections: Not on file  Intimate Partner Violence: Not on file     Family History:   The patient's family history includes Heart attack in her father; Heart disease in her mother; Hypercholesterolemia in her father; Hypertension in her father and mother. There is no history of Migraines, Stroke, or Transient ischemic attack.    ROS:  Please see the history of present illness.  All other ROS reviewed and negative.     Physical Exam/Data: Vitals:   06/06/23 1200 06/06/23 1215 06/06/23 1230  BP: (!) 167/66 (!) 146/58 (!) 157/65  Pulse: 61 62 60  Resp: 12 15 16   SpO2: 95% 95% 95%   No intake or output data in the 24 hours ending 06/06/23 1432    09/27/2022    2:29 PM 09/16/2021    2:09 PM 02/28/2021    1:09 PM  Last 3 Weights  Weight (lbs) 153 lb 155 lb 3.2 oz 166 lb  Weight (kg) 69.4 kg 70.398 kg 75.297 kg     There is no height or weight on file to calculate BMI.  General:  Well nourished, well developed, in no acute distress  HEENT: normal Neck: no JVD Vascular: Radial pulses 2+ bilaterally  Cardiac:  normal S1, S2; RRR; no murmur  Lungs:  clear to auscultation bilaterally, no wheezing, rhonchi or rales  Abd: soft, nontender, no hepatomegaly  Ext: no edema Musculoskeletal:  No deformities Skin: warm and dry  Neuro:  no focal abnormalities noted Psych:  Normal affect    Laboratory Data: High Sensitivity Troponin:  No results for input(s): "TROPONINIHS" in the last 720 hours.    ChemistryNo  results for input(s): "NA", "K", "CL", "CO2", "GLUCOSE", "BUN", "CREATININE", "CALCIUM ", "MG", "GFRNONAA", "GFRAA", "ANIONGAP" in the last 168 hours.  No results for input(s): "PROT", "ALBUMIN", "AST", "ALT", "ALKPHOS", "BILITOT" in the last 168 hours. Lipids No results for input(s): "CHOL", "TRIG", "HDL", "LABVLDL", "LDLCALC", "CHOLHDL" in the last 168 hours. HematologyNo results for input(s): "WBC", "RBC", "HGB", "HCT", "MCV", "MCH", "MCHC", "RDW", "PLT" in the last 168 hours. Thyroid  No results for input(s): "TSH", "FREET4" in the last 168 hours. BNPNo results for input(s): "BNP", "PROBNP" in the last 168 hours.  DDimer No results for input(s): "DDIMER" in the last 168 hours.  Radiology/Studies:  No results found.   Assessment and Plan:  NSTEMI  - Patient has a history of elevated coronary calcium  score- in 09/2019, calcium  score was in the 74th percentile - Presented to [facility] on 5/31 with chest pain. Troponin I 0.02>0.38>0.61>0.62>0.25 - Echo at [facility] showed EF 55-60%, normal wall motion, normal RV size and systolic function, no significant valvular abnormalities.  - Seen by cardiology at [facility] and was transferred to [facility] for cardiac catheterization. Reviewed indications, risks, and benefits of cath and patient is in agreement  - Continue ASA 81 mg daily  - Continue simvastatin 10 mg daily for now, see below for PCSK9i discussion  - Continue metoprolol succinate 100 mg BID   HLD  - In the past, patient has been intolerant of multiple statins - Lipid panel at [facility] 06/04/23- LDL 218, HDL 66, triglycerides 89, total cholesterol 302  - She was started on simvastatin 10 mg daily at [facility] and so far is tolerating it well. Very unlikely simvastatin will get her LDL down to goal (>70). Recommend PCSK9i, patient is willing to try the medication but is concerned about cost. Recommend pharmD lipid clinic referral at [facility]   HTN  - At [facility], metoprolol succinate was  increased to 100 mg BID. She was also started on losartan 25 mg BID  - Continue metoprolol succinate 100 mg BID - Transition to losartan 50 mg daily   Risk Assessment/Risk Scores:   TIMI Risk Score for Unstable Angina or Non-ST Elevation MI:   The patient's TIMI risk score is 3, which indicates a 13%  risk of all cause mortality, new or recurrent myocardial infarction or need for urgent revascularization in the next 14 days.   Code Status: Full Code  Severity of Illness: The appropriate patient status for this patient is INPATIENT. Inpatient status is judged to be reasonable and necessary in order to provide the required intensity of service to ensure the patient's safety. The patient's presenting symptoms, physical exam findings, and initial radiographic and laboratory data in the context of their chronic comorbidities is felt to place them at high risk for further clinical deterioration. Furthermore, it is not anticipated that the patient will be medically stable for discharge from the hospital within 2 midnights of admission.   * I certify that at the point of admission it is my clinical judgment that the patient will require inpatient hospital care spanning beyond 2 midnights from the point of admission due to high intensity of service, high risk for further deterioration and high frequency of surveillance required.*  For questions or updates, please contact [facility] Please consult [email or web address] for contact info under     Signed, Debria Fang, PA-C  06/06/2023 2:32 PM   ATTENDING ATTESTATION:  After conducting a review of all available clinical information with the care team, interviewing the patient, and performing a physical exam, I agree with the findings and plan described in this note.   GEN: No acute distress.   HEENT:  MMM, no JVD, no scleral icterus Cardiac: RRR, no murmurs, rubs, or gallops.  Respiratory: Clear to auscultation bilaterally. GI: Soft,  nontender, non-distended  MS: No edema; No deformity. Neuro:  Nonfocal  Vasc:  +2 radial pulses  Patient is 78 year old female with a history of hypertension, hyperlipidemia, and coronary artery calcification who presented to an outside hospital with anginal symptoms.  Her troponins were mildly elevated.  Her blood pressure was also quite elevated.  Given an acute coronary syndrome we will refer her for definitive anatomic assessment with coronary angiography.  Further recommendations will be informed by the results of this procedure.  Alyssa Backbone, MD Pager [phone]

## 2023-06-07 ENCOUNTER — Other Ambulatory Visit (HOSPITAL_COMMUNITY): Payer: Self-pay

## 2023-06-07 ENCOUNTER — Encounter (HOSPITAL_COMMUNITY): Payer: Self-pay | Admitting: Internal Medicine

## 2023-06-07 ENCOUNTER — Other Ambulatory Visit: Payer: Self-pay

## 2023-06-07 DIAGNOSIS — Z955 Presence of coronary angioplasty implant and graft: Secondary | ICD-10-CM | POA: Diagnosis not present

## 2023-06-07 DIAGNOSIS — I10 Essential (primary) hypertension: Secondary | ICD-10-CM

## 2023-06-07 DIAGNOSIS — I214 Non-ST elevation (NSTEMI) myocardial infarction: Secondary | ICD-10-CM | POA: Diagnosis not present

## 2023-06-07 DIAGNOSIS — I471 Supraventricular tachycardia, unspecified: Secondary | ICD-10-CM

## 2023-06-07 DIAGNOSIS — E7801 Familial hypercholesterolemia: Secondary | ICD-10-CM

## 2023-06-07 DIAGNOSIS — I4729 Other ventricular tachycardia: Secondary | ICD-10-CM

## 2023-06-07 LAB — CBC
HCT: 33.8 % — ABNORMAL LOW (ref 36.0–46.0)
Hemoglobin: 11.1 g/dL — ABNORMAL LOW (ref 12.0–15.0)
MCH: 28.3 pg (ref 26.0–34.0)
MCHC: 32.8 g/dL (ref 30.0–36.0)
MCV: 86.2 fL (ref 80.0–100.0)
Platelets: 235 10*3/uL (ref 150–400)
RBC: 3.92 MIL/uL (ref 3.87–5.11)
RDW: 14 % (ref 11.5–15.5)
WBC: 5.7 10*3/uL (ref 4.0–10.5)
nRBC: 0 % (ref 0.0–0.2)

## 2023-06-07 LAB — LIPID PANEL
Cholesterol: 222 mg/dL — ABNORMAL HIGH (ref 0–200)
HDL: 48 mg/dL (ref >40)
LDL Cholesterol: 137 mg/dL — ABNORMAL HIGH (ref 0–99)
Total CHOL/HDL Ratio: 4.6 ratio
Triglycerides: 186 mg/dL — ABNORMAL HIGH (ref <150)
VLDL: 37 mg/dL (ref 0–40)

## 2023-06-07 LAB — BASIC METABOLIC PANEL WITH GFR
Anion gap: 7 (ref 5–15)
BUN: 20 mg/dL (ref 8–23)
CO2: 24 mmol/L (ref 22–32)
Calcium: 8.3 mg/dL — ABNORMAL LOW (ref 8.9–10.3)
Chloride: 102 mmol/L (ref 98–111)
Creatinine, Ser: 1.03 mg/dL — ABNORMAL HIGH (ref 0.44–1.00)
GFR, Estimated: 55 mL/min — ABNORMAL LOW (ref >60)
Glucose, Bld: 112 mg/dL — ABNORMAL HIGH (ref 70–99)
Potassium: 3.7 mmol/L (ref 3.5–5.1)
Sodium: 133 mmol/L — ABNORMAL LOW (ref 135–145)

## 2023-06-07 LAB — MAGNESIUM: Magnesium: 2 mg/dL (ref 1.7–2.4)

## 2023-06-07 MED ORDER — METOPROLOL SUCCINATE ER 50 MG PO TB24
50.0000 mg | ORAL_TABLET | Freq: Every day | ORAL | Status: DC
  Administered 2023-06-07: 50 mg via ORAL
  Filled 2023-06-07: qty 1

## 2023-06-07 MED ORDER — TICAGRELOR 90 MG PO TABS
90.0000 mg | ORAL_TABLET | Freq: Two times a day (BID) | ORAL | 3 refills | Status: DC
  Filled 2023-06-07 (×2): qty 180, 90d supply, fill #0

## 2023-06-07 MED ORDER — SIMVASTATIN 10 MG PO TABS
10.0000 mg | ORAL_TABLET | Freq: Every day | ORAL | 11 refills | Status: DC
  Filled 2023-06-07 – 2023-07-05 (×2): qty 30, 30d supply, fill #0

## 2023-06-07 MED ORDER — METOPROLOL SUCCINATE ER 50 MG PO TB24
50.0000 mg | ORAL_TABLET | Freq: Every day | ORAL | 3 refills | Status: AC
  Filled 2023-06-07: qty 90, 90d supply, fill #0

## 2023-06-07 MED ORDER — TICAGRELOR 90 MG PO TABS
90.0000 mg | ORAL_TABLET | Freq: Two times a day (BID) | ORAL | 3 refills | Status: DC
  Filled 2023-06-07: qty 180, 90d supply, fill #0

## 2023-06-07 MED ORDER — NITROGLYCERIN 0.4 MG SL SUBL
0.4000 mg | SUBLINGUAL_TABLET | SUBLINGUAL | 3 refills | Status: AC | PRN
  Filled 2023-06-07: qty 25, 5d supply, fill #0

## 2023-06-07 MED ORDER — ASPIRIN 81 MG PO TBEC
81.0000 mg | DELAYED_RELEASE_TABLET | Freq: Every day | ORAL | 2 refills | Status: AC
  Filled 2023-06-07: qty 150, 150d supply, fill #0

## 2023-06-07 MED ORDER — LOSARTAN POTASSIUM 25 MG PO TABS
25.0000 mg | ORAL_TABLET | Freq: Every day | ORAL | 3 refills | Status: DC
  Filled 2023-06-07: qty 90, 90d supply, fill #0

## 2023-06-07 NOTE — Progress Notes (Signed)
 Preparing pt for discharge, pt alert and oriented. Discharge instructions reviewed with pt and daughter, pt/daughter verbalized understanding. No s/s of distress noted. IV's removed, cath intact. Pt reports she has all of her belongings. Pt will be discharged.

## 2023-06-07 NOTE — Progress Notes (Signed)
 CARDIAC REHAB PHASE I   PRE:  Rate/Rhythm: 79 SR    BP: sitting 124/75    SpO2:   MODE:  Ambulation: 430 ft   POST:  Rate/Rhythm: 111 sT    BP: sitting 127/64     SpO2:   Ambulated without c/o. VSS, to recliner.  Discussed with pt and daughter (on phone) MI, stent, restrictions, importance of Brilinta, diet, exercise, NTG, and CRPII. Pt very receptive. Will refer to [facility] (pt prefers [facility] over other facilities). [id number]   German Koller BS, ACSM-CEP 06/07/2023 9:57 AM

## 2023-06-07 NOTE — TOC CM/SW Note (Signed)
 Transition of Care [facility]) - Inpatient Brief Assessment   Patient Details  Name: Sharon Marsh MRN: [id number] Date of Birth: [birth year]  [facility] CM/SW Contact:    Cosimo Diones, RN Phone Number: 06/07/2023, 11:32 AM   Clinical Narrative: Patient presented for chest pain-post stent. Benefits check completed for Brilinta. Co pay is  $286.32 due to a deductible and then it will be $47.00 once deductible is met. Message sent to PA and the plan is  generic Brilinta 90 days from [facility] that will be $90 cash price. Per PA, patient and daughter are aware and daughter will pay. No further needs identified at this time.    Transition of Care Asessment: Insurance and Status: Insurance coverage has been reviewed Patient has primary care physician: Yes Prior/Current Home Services: No current home services Social Drivers of Health Review: SDOH reviewed no interventions necessary Readmission risk has been reviewed: Yes Transition of care needs: no transition of care needs at this time

## 2023-06-07 NOTE — Discharge Summary (Signed)
 Discharge Summary   Patient ID: Sharon Marsh MRN: [id number]; DOB: 1945-01-19  Admit date: 06/06/2023 Discharge date: 06/07/2023  PCP:  Olga Berthold, NP   [facility] Providers Cardiologist:  Sheryle Donning, MD   Discharge Diagnoses  Principal Problem:   ACS (acute coronary syndrome) [facility]) Active Problems:   Essential hypertension   Bulging lumbar disc   Elevated coronary artery calcium  score   History of transient ischemic attack (TIA)   Heterozygous familial hypercholesterolemia   Diagnostic Studies/Procedures   LHC 06/06/23:   Prox LAD lesion is 80% stenosed.   Prox RCA lesion is 30% stenosed.   Ost RCA to Prox RCA lesion is 20% stenosed.   A stent was successfully placed.   Post intervention, there is a 0% residual stenosis.   1.  High-grade, calcified proximal LAD lesion treated with complex OCT guided PCI requiring multiple wires, shockwave lithotripsy, and Cutting Balloon angioplasty with 1 drug-eluting stent placed. 2.  Mild, nonobstructive disease of the RCA and left circumflex. 3.  LVEDP 5 mmHg.   Recommendation: Dual antiplatelet therapy for at least 6 months and preferably 1 year. _____________   History of Present Illness   Sharon Marsh is a 78 y.o. female with a past medical history of hypertension, HLD, obesity, elevated calcium  score who is being seen 06/06/2023 for the evaluation of chest pain.   Sharon Marsh is a 78 year old female with above medical history who is followed by Dr. Veryl Gottron. Per chart review, patient previously had an echocardiogram in 07/2019 that showed EF 60-65%, no regional wall motion abnormalities, mild LVH, grade I DD, normal RV systolic function, mild MR, mild AI. Also underwent coronary calcium  scoring in 09/2019 that showed a coronary calcium  score of 226 (74th percentile). Later, a cardiac monitor in 07/2020 showed predominantly NSR, 8 runs of SVT with the longest run lasting 12 beats. There were no VT, afib,  high degree block, or pauses noted.    Patient was last seen by Dr. Veryl Gottron on 09/27/22. At that time, patient was doing well. Denied chest pain, shortness of breath, syncope, palpitations, LE edema. She had stopped taking ASA 81 mg because she reported significant bruising on it. A recent lipid panel showed LDL 206. She had previously had intolerances of statins, and refused to start a PCSK9i   Patient presented to [facility] ED on 5/31 complaining of mid-right sided chest pain with SOB and nausea. Symptoms had started about 1 hour prior to her presentation. Reported feeling SOB because it was difficult to take a deep breath due to the pain. Overall, chest pain sounded pleuritic. Her BP was elevated to 199/77.    Labs in the ED significant for creatinine 1.00, K 3.8, LFTs within normal limits. Hemoglobin 11.8, WBC 5.3. Troponin I 0.02>0.38>0.61>0.62>0.25.  CXR showed no focal consolidations. CTA chest showed no evidence of PE, ground-glass densities in the lower lobes that may be consistent with cardiogenic pulmonary edema.    Patient was admitted to [facility]. Treated with NSTEMI IV heparin and started on ASA 81 mg daily. Started on metoprolol succinate 50 mg BID, losartan 25 mg BID. Also started on simvastatin 10 mg daily. Cardiology saw patient while at [facility] and they recommended cardiac catheterization. Echocardiogram at [facility] showed EF 55-60%, normal wall motion, normal RV size and systolic function, no significant valvular abnormalities.     Hospital Course   Consultants: none  CAD Heart catheterization showed 80% proximal LAD lesion successfully treated with complex OCT guided  PCI with a 2.75 x 32 mm Synergy XD stent.  Residual 20% ostial RCA lesion followed by 30% proximal RCA yielded an FFR of 0.76, intervention was deferred.  She tolerated the procedure well and was started on DAPT with aspirin  and Brilinta times at least 6 months, preferably 1 year. Overnight, BP was  marginal and her post cath fluids were extended. transition lopressor to toprol per patient request, started on 25 mg losartan She has ambulated with cardiac rehab and did well.   Brand name brilinta will be expensive. A 90 day supply of generic ticagrelor was given by [facility] pharmacy. Will likely need to switch to plavix  with 300 mg load after 90 days, but may depend on price of generic brilinta. Unclear when retail pharmacies will have generic available.   Hyperlipidemia with LDL goal less than 70 Statin intolerance Familial hyperlipidemia  - Has previously refused PCSK9 inhibitor - possibly due to cost -- she has had high cholesterol since her 62s, per patient -- will add on lipid panel, LPA pending -- she is now amenable to discussing PCSK9i, may also be a candidate for inclisirin -- will refer to lipid clinic at discharge   Hypertension - She was started on 25 mg losartan at [facility] - As above, BP marginal this morning, now improved to SBP in the 130s on my exam   PSVT NSVT -On 50 mg Lopressor twice daily - Brief runs of SVT noted on heart monitor in 2022 -- NSVT on telemetry this morning - 15 beats - she states she feels intermittent palpitations -- add on Mg pending -- she requests once daily dosing of metoprolol, will change to 50 mg toprol   Snoring - she at times wakes up gasping for air - will likely need an OP sleep study   Patient seen and examined and deemed stable for discharge.  Follow-up has been arranged.      Did the patient have an acute coronary syndrome (MI, NSTEMI, STEMI, etc) this admission?:  Yes                               AHA/ACC ACS Clinical Performance & Quality Measures: Aspirin  prescribed? - Yes ADP Receptor Inhibitor (Plavix /Clopidogrel , Brilinta/Ticagrelor or Effient/Prasugrel) prescribed (includes medically managed patients)? - Yes Beta Blocker prescribed? - Yes High Intensity Statin (Lipitor 40-80mg  or Crestor 20-40mg )  prescribed? - No - intolerance EF assessed during THIS hospitalization? - Yes For EF <40%, was ACEI/ARB prescribed? - Yes For EF <40%, Aldosterone Antagonist (Spironolactone or Eplerenone) prescribed? - Not Applicable (EF >/= 40%) Cardiac Rehab Phase II ordered (including medically managed patients)? - Yes       The patient will be scheduled for a TOC follow up appointment in 7-14 days.  A message has been sent to the [facility] and Scheduling Pool at the office where the patient should be seen for follow up.  _____________  Discharge Vitals Blood pressure 127/64, pulse 82, temperature 98.1 F (36.7 C), temperature source Oral, resp. rate 18, height 5\' 1"  (1.549 m), weight 68.7 kg, SpO2 98%.  Filed Weights   06/06/23 1815  Weight: 68.7 kg    Labs & Radiologic Studies  CBC Recent Labs    06/07/23 0402  WBC 5.7  HGB 11.1*  HCT 33.8*  MCV 86.2  PLT 235   Basic Metabolic Panel Recent Labs    29/56/21 0401 06/07/23 0402  NA  --  133*  K  --  3.7  CL  --  102  CO2  --  24  GLUCOSE  --  112*  BUN  --  20  CREATININE  --  1.03*  CALCIUM   --  8.3*  MG 2.0  --    Liver Function Tests No results for input(s): "AST", "ALT", "ALKPHOS", "BILITOT", "PROT", "ALBUMIN" in the last 72 hours. No results for input(s): "LIPASE", "AMYLASE" in the last 72 hours. High Sensitivity Troponin:   No results for input(s): "TROPONINIHS" in the last 720 hours.  No results for input(s): "TRNPT" in the last 720 hours.  BNP Invalid input(s): "POCBNP" No results for input(s): "PROBNP" in the last 72 hours.  No results for input(s): "BNP" in the last 72 hours.  D-Dimer No results for input(s): "DDIMER" in the last 72 hours. Hemoglobin A1C No results for input(s): "HGBA1C" in the last 72 hours. Fasting Lipid Panel Recent Labs    06/07/23 0401  CHOL 222*  HDL 48  LDLCALC 137*  TRIG 186*  CHOLHDL 4.6   No results found for: "LIPOA"  Thyroid  Function Tests No results for input(s): "TSH",  "T4TOTAL", "T3FREE", "THYROIDAB" in the last 72 hours.  Invalid input(s): "FREET3" _____________  CARDIAC CATHETERIZATION Result Date: 06/06/2023   Prox LAD lesion is 80% stenosed.   Prox RCA lesion is 30% stenosed.   Ost RCA to Prox RCA lesion is 20% stenosed.   A stent was successfully placed.   Post intervention, there is a 0% residual stenosis. 1.  High-grade, calcified proximal LAD lesion treated with complex OCT guided PCI requiring multiple wires, shockwave lithotripsy, and Cutting Balloon angioplasty with 1 drug-eluting stent placed. 2.  Mild, nonobstructive disease of the RCA and left circumflex. 3.  LVEDP 5 mmHg. Recommendation: Dual antiplatelet therapy for at least 6 months and preferably 1 year.    Disposition Pt is being discharged home today in good condition.  Follow-up Plans & Appointments  Discharge Instructions     AMB Referral to Advanced Lipid Disorders Clinic   Complete by: As directed    PharmD for PCSK9i vs inclisirin   Internal Lipid Clinic Referral Scheduling  Internal lipid clinic referrals are providers within [facility], who wish to refer established patients for routine management (help in starting PCSK9 inhibitor therapy) or advanced therapies.  Internal MD referral criteria:              1. All patients with LDL>190 mg/dL  2. All patients with Triglycerides >500 mg/dL  3. Patients with suspected or confirmed heterozygous familial hyperlipidemia (HeFH) or homozygous familial hyperlipidemia (HoFH)  4. Patients with family history of suspicious for genetic dyslipidemia desiring genetic testing  5. Patients refractory to standard guideline based therapy  6. Patients with statin intolerance (failed 2 statins, one of which must be a high potency statin)  7. Patients who the provider desires to be seen by MD   Internal PharmD referral criteria:   1. Follow-up patients for medication management  2. Follow-up for compliance monitoring  3. Patients for drug  education  4. Patients with statin intolerance  5. PCSK9 inhibitor education and prior authorization approvals  6. Patients with triglycerides <500 mg/dL  External Lipid Clinic Referral  External lipid clinic referrals are for providers outside of [facility], considered new clinic patients - automatically routed to MD schedule   Amb Referral to Cardiac Rehabilitation   Complete by: As directed    Diagnosis:  Coronary Stents NSTEMI PTCA     After initial  evaluation and assessments completed: Virtual Based Care may be provided alone or in conjunction with Phase 2 Cardiac Rehab based on patient barriers.: Yes   Intensive Cardiac Rehabilitation (ICR) [facility] location only OR Traditional Cardiac Rehabilitation (TCR) *If criteria for ICR are not met will enroll in TCR [facility] only): Yes       Discharge Medications Allergies as of 06/07/2023       Reactions   Clindamycin Other (See Comments)   Clindamycin/lincomycin Hypertension   Doxycycline    Irritates Hernia   Latex Other (See Comments)   Patient states that certain latex bandades causes irritation to her skin        Medication List     STOP taking these medications    metoprolol tartrate 50 MG tablet Commonly known as: LOPRESSOR       TAKE these medications    ACETYLCYSTEINE PO Take 1 each by mouth daily. Also known as NAC (pt knows as this) - Take 1 capsules by mouth once daily. May also take extra if needed.   Ascorbic Acid 1000 MG Chew Chew 1,000 mg by mouth daily.   aspirin  EC 81 MG tablet Take 1 tablet (81 mg total) by mouth daily. Swallow whole.   b complex vitamins tablet Take 1 tablet by mouth daily.   cholecalciferol 25 MCG (1000 UNIT) tablet Commonly known as: VITAMIN D3 Take 1,000 Units by mouth daily.   Coenzyme Q10 200 MG capsule Take by mouth daily.   losartan 25 MG tablet Commonly known as: COZAAR Take 1 tablet (25 mg total) by mouth daily.   MAGNESIUM PO Take 2 each by mouth at bedtime.  Take 2 gummies by mouth every night   metoprolol succinate 50 MG 24 hr tablet Commonly known as: Toprol XL Take 1 tablet (50 mg total) by mouth daily. Take with or immediately following a meal.   nitroGLYCERIN 0.4 MG SL tablet Commonly known as: Nitrostat Place 1 tablet (0.4 mg total) under the tongue every 5 (five) minutes as needed for chest pain.   omeprazole 20 MG capsule Commonly known as: PRILOSEC Take 20 mg by mouth daily.   simvastatin 10 MG tablet Commonly known as: ZOCOR Take 1 tablet (10 mg total) by mouth daily at 6 PM.   ticagrelor 90 MG Tabs tablet Commonly known as: BRILINTA Take 1 tablet (90 mg total) by mouth 2 (two) times daily.   zolpidem 10 MG tablet Commonly known as: AMBIEN Take 10 mg by mouth at bedtime.         Outstanding Labs/Studies    Duration of Discharge Encounter: APP Time: 30 minutes   Signed, Lamond Pilot, PA 06/07/2023, 10:22 AM

## 2023-06-07 NOTE — Plan of Care (Signed)
  Problem: Education: Goal: Knowledge of General Education information will improve Description: Including pain rating scale, medication(s)/side effects and non-pharmacologic comfort measures Outcome: Progressing   Problem: Clinical Measurements: Goal: Ability to maintain clinical measurements within normal limits will improve Outcome: Progressing Goal: Will remain free from infection Outcome: Progressing Goal: Diagnostic test results will improve Outcome: Progressing Goal: Respiratory complications will improve Outcome: Progressing Goal: Cardiovascular complication will be avoided Outcome: Progressing   Problem: Coping: Goal: Level of anxiety will decrease Outcome: Progressing   Problem: Nutrition: Goal: Adequate nutrition will be maintained Outcome: Progressing

## 2023-06-08 LAB — LIPOPROTEIN A (LPA): Lipoprotein (a): 230.8 nmol/L — ABNORMAL HIGH (ref <75.0)

## 2023-06-09 ENCOUNTER — Telehealth (HOSPITAL_COMMUNITY): Payer: Self-pay

## 2023-06-09 MED FILL — Lidocaine HCl Local Preservative Free (PF) Inj 1%: INTRAMUSCULAR | Qty: 30 | Status: AC

## 2023-06-09 NOTE — Telephone Encounter (Signed)
 Pt insurance is active and benefits verified through [facility] Medicare Co-pay 0, DED 0/0 met, out of pocket $3,900/$1.44 met, co-insurance 0%. no pre-authorization required. Passport, 06/09/2023@2 :57, REF# [id number]  How many CR sessions are covered? (ICR)72 Is this a lifetime maximum or an annual maximum? annual Has the member used any of these services to date? no Is there a time limit (weeks/months) on start of program and/or program completion? no  Will contact patient to see if she is interested in the Cardiac Rehab Program. If interested, patient will need to complete follow up appt. Once completed, patient will be contacted for scheduling upon review by the RN Navigator.

## 2023-06-12 ENCOUNTER — Telehealth (HOSPITAL_BASED_OUTPATIENT_CLINIC_OR_DEPARTMENT_OTHER): Payer: Self-pay | Admitting: Cardiology

## 2023-06-12 NOTE — Telephone Encounter (Signed)
 Spoke with patient regarding Brilinta   Stated her blood pressure had been doing well but last night after taking the Brilinta  heart started pounding blood pressure up to 189/92 Had taken her Simvastatin  and Brilinta  evening doses, takes Metoprolol  and Losartan  in the morning  Night before blood pressure 153/89 HR 91 Friday night blood pressure 155/85  No issues taking the Brlinita in the morning This am blood pressure 106/63 "Feels a little fuzzy on this medication"   Last night had bump at cath site, improved today  Pink above the cath site, denies red streaks  May be a little warmer than other arm Hand feels cold/numb/tingling-does not feel like other hand.  Last night had arm pain radiating up to bend of arm, wrist feels tight   Will forward to Dr Veryl Gottron for review

## 2023-06-12 NOTE — Telephone Encounter (Signed)
 Pt c/o BP issue: STAT if pt c/o blurred vision, one-sided weakness or slurred speech.  STAT if BP is GREATER than 180/120 TODAY.  STAT if BP is LESS than 90/60 and SYMPTOMATIC TODAY  1. What is your BP concern?  Patient is concerned her BP has been trending high since taking Brilinta  at night.    2. Have you taken any BP medication today?  Yes  3. What are your last 5 BP readings?  189/95 153/89 136/79 130/70 138/73  4. Are you having any other symptoms (ex. Dizziness, headache, blurred vision, passed out)?   Tingling and numbness in right arm with slight itching  Pt c/o medication issue:  1. Name of Medication:   ticagrelor  (BRILINTA ) 90 MG TABS tablet   2. How are you currently taking this medication (dosage and times per day)?   As prescribed  3. Are you having a reaction (difficulty breathing--STAT)?   4. What is your medication issue?   Patient is concern her BP has been trending high on this medication.  Patient stated on Sunday she had tingling and numbness in her right arm with slight itching.  Patient stated last night after taking medication she also had pain in her elbow but not as bad as Sunday.  Patient noted the high BP readings are usually at night after taking medication.  Patient wants advice on next steps.

## 2023-06-19 DIAGNOSIS — I10 Essential (primary) hypertension: Secondary | ICD-10-CM | POA: Diagnosis not present

## 2023-06-19 DIAGNOSIS — Z79899 Other long term (current) drug therapy: Secondary | ICD-10-CM | POA: Diagnosis not present

## 2023-06-19 DIAGNOSIS — I2510 Atherosclerotic heart disease of native coronary artery without angina pectoris: Secondary | ICD-10-CM | POA: Diagnosis not present

## 2023-06-19 DIAGNOSIS — I471 Supraventricular tachycardia, unspecified: Secondary | ICD-10-CM | POA: Diagnosis not present

## 2023-06-19 DIAGNOSIS — E7849 Other hyperlipidemia: Secondary | ICD-10-CM | POA: Diagnosis not present

## 2023-06-20 ENCOUNTER — Ambulatory Visit (HOSPITAL_BASED_OUTPATIENT_CLINIC_OR_DEPARTMENT_OTHER): Admitting: Family

## 2023-06-20 VITALS — BP 140/72 | HR 70 | Ht 61.0 in | Wt 153.0 lb

## 2023-06-20 DIAGNOSIS — M5136 Other intervertebral disc degeneration, lumbar region with discogenic back pain only: Secondary | ICD-10-CM | POA: Diagnosis not present

## 2023-06-20 DIAGNOSIS — E7801 Familial hypercholesterolemia: Secondary | ICD-10-CM | POA: Diagnosis not present

## 2023-06-20 DIAGNOSIS — I25118 Atherosclerotic heart disease of native coronary artery with other forms of angina pectoris: Secondary | ICD-10-CM | POA: Diagnosis not present

## 2023-06-20 DIAGNOSIS — I10 Essential (primary) hypertension: Secondary | ICD-10-CM | POA: Diagnosis not present

## 2023-06-20 DIAGNOSIS — Z8673 Personal history of transient ischemic attack (TIA), and cerebral infarction without residual deficits: Secondary | ICD-10-CM

## 2023-06-20 DIAGNOSIS — I2510 Atherosclerotic heart disease of native coronary artery without angina pectoris: Secondary | ICD-10-CM

## 2023-06-20 DIAGNOSIS — M9903 Segmental and somatic dysfunction of lumbar region: Secondary | ICD-10-CM | POA: Diagnosis not present

## 2023-06-20 MED ORDER — REPATHA SURECLICK 140 MG/ML ~~LOC~~ SOAJ: 140.0000 mg | SUBCUTANEOUS | 0 refills | Status: DC

## 2023-06-20 MED ORDER — REPATHA SURECLICK 140 MG/ML ~~LOC~~ SOAJ: 140.0000 mg | SUBCUTANEOUS | 6 refills | Status: DC

## 2023-06-20 NOTE — Patient Instructions (Addendum)
 Medication Instructions:   START Repatha 140mg  daily  REDUCE Simvastatin  to 10mg  three times per week  AFTER 4 doses of Repatha, you may stop your Simvastatin    *If you need a refill on your cardiac medications before your next appointment, please call your pharmacy*  Lab Work: Your physician recommends that you return for lab work fasting lipid panel after 4 doses of Repatha  If you have labs (blood work) drawn today and your tests are completely normal, you will receive your results only by: MyChart Message (if you have MyChart) OR A paper copy in the mail If you have any lab test that is abnormal or we need to change your treatment, we will call you to review the results.  Testing/Procedures: Your EKG today looked great!  Follow-Up: At [facility], you and your health needs are our priority.  As part of our continuing mission to provide you with exceptional heart care, our providers are all part of one team.  This team includes your primary Cardiologist (physician) and Advanced Practice Providers or APPs (Physician Assistants and Nurse Practitioners) who all work together to provide you with the care you need, when you need it.  Your next appointment:   2 month(s)  Provider:   Sheryle Donning, MD or Neomi Banks, NP    We recommend signing up for the patient portal called MyChart.  Sign up information is provided on this After Visit Summary.  MyChart is used to connect with patients for Virtual Visits (Telemedicine).  Patients are able to view lab/test results, encounter notes, upcoming appointments, etc.  Non-urgent messages can be sent to your provider as well.   To learn more about what you can do with MyChart, go to [email or web address].   Other Instructions     Your prescription deductible for Repatha would be $255. This is a one time deductible you pay each year.  Your copay for the Repatha is $47 for a 30 day supply or $131 for a 90 day supply  (which is $44 per month) Your first month of the Repatha will be the $255 plus $47 so about $300. Then each month after that will be just $47  The copay Brilinta  is $47 per month. If the $47 per month is too expensive after 3 months, let us  know and we can start the Clopidogrel  (Plavix ) instead.   Recommend checking blood pressure once per day at least an hour after medications.  If you check your blood pressure and your BP is more than 180, sit and rest for 30 minutes. Recheck your blood pressure, if still more than 180 - take another Losartan  25mg  daily  If your BP is consistently more than 130 or less than 110 let us  know.

## 2023-06-20 NOTE — Progress Notes (Unsigned)
 Cardiology Office Note   Date:  06/26/2023  ID:  Sharon Marsh, Sharon Marsh Feb 06, 1945, MRN [id number] PCP: Sharon Greig DELENA, NP  [facility] Providers Cardiologist:  Sharon Bruckner, MD     History of Present Illness Sharon Marsh is a 78 y.o. female with hx of palpitations, CAD s/p DES-LAD 06/2023, familial hyperlipidemia, HTN.   Admitted 6/3-06/07/23 after transfer from [facility] 06/03/23 right right sided chest pain. Echo at [facility] LVEF 55-60%, normal wall motion, no significant valvular abnormalities, normal RV. LHC with 80% prox LAD lesion treated with PCI/DES. Residual 20% osital RCA followed by 30% prox RCA with FFR 0.76, intervention deferred. Recommended for DAPT x 6 months, ideally 1 year. Prior intolerance to statin.   Discussed the use of AI scribe software for clinical note transcription with the patient, who gave verbal consent to proceed.  History of Present Illness Sharon Marsh is a 78 year old female with coronary artery disease who presents for follow-up after recent hospitalization and stent placement.  She experiences episodes of chest discomfort described as 'little pressure' and associates it with indigestion, relieved by Pepcid. There is no current chest pain similar to pre-hospitalization symptoms. She describes shoulder pain, concerned about differentiating it from cardiac-related pain. Reviewed anginal equivalent.   She has elevated lipoprotein A at 230 and takes simvastatin , experiencing palpitations and discomfort possibly related to the medication. Prior intolerance to Rosuvastatin, Atorvastatin, Ptavastatin. She is on Brilinta  and aspirin  for stent protection, with bruising and soreness at cath site which is gradually improving.   Blood pressure is generally low, around 112, but spiked to 180/90 post-hospitalization. She monitors it regularly, noting it remains low with reduced activity. She inquires about resuming activities like riding  mower and lifting things around the house, expressing a desire to return to her routine. She feels lightheaded recently and is focusing on hydration and regular meals.  ROS: Please see the history of present illness.    All other systems reviewed and are negative.   Studies Reviewed EKG Interpretation Date/Time:  Tuesday June 20 2023 14:54:53 EDT Ventricular Rate:  70 PR Interval:  150 QRS Duration:  74 QT Interval:  368 QTC Calculation: 397 R Axis:   72  Text Interpretation: Normal sinus rhythm Normal ECG Confirmed by Sharon Marsh ([id number]) on 06/20/2023 3:40:23 PM    Cardiac Studies & Procedures   ______________________________________________________________________________________________ CARDIAC CATHETERIZATION  CARDIAC CATHETERIZATION 06/06/2023  Conclusion   Prox LAD lesion is 80% stenosed.   Prox RCA lesion is 30% stenosed.   Ost RCA to Prox RCA lesion is 20% stenosed.   A stent was successfully placed.   Post intervention, there is a 0% residual stenosis.  1.  High-grade, calcified proximal LAD lesion treated with complex OCT guided PCI requiring multiple wires, shockwave lithotripsy, and Cutting Balloon angioplasty with 1 drug-eluting stent placed. 2.  Mild, nonobstructive disease of the RCA and left circumflex. 3.  LVEDP 5 mmHg.  Recommendation: Dual antiplatelet therapy for at least 6 months and preferably 1 year.  Findings Coronary Findings Diagnostic  Dominance: Right  Left Anterior Descending Prox LAD lesion is 80% stenosed.  Left Circumflex The vessel exhibits minimal luminal irregularities.  First Obtuse Marginal Branch The vessel exhibits minimal luminal irregularities.  Second Obtuse Marginal Branch The vessel exhibits minimal luminal irregularities.  Third Obtuse Marginal Branch The vessel exhibits minimal luminal irregularities.  Right Coronary Artery Ost RCA to Prox RCA lesion is 20% stenosed. Prox RCA lesion is 30%  stenosed.  Intervention  Prox LAD lesion Stent A stent was successfully placed. Post-Intervention Lesion Assessment The intervention was successful. Pre-interventional TIMI flow is 3. Post-intervention TIMI flow is 3. There is a 0% residual stenosis post intervention.     ECHOCARDIOGRAM  ECHOCARDIOGRAM COMPLETE 07/30/2019  Narrative ECHOCARDIOGRAM REPORT    Patient Name:   Sharon Marsh Date of Exam: 07/30/2019 Medical Rec #:  [id number]         Height:       63.0 in Accession #:    [id number]        Weight:       181.0 lb Date of Birth:  Aug 15, 1945        BSA:          1.853 m Patient Age:    73 years          BP:           150/80 mmHg Patient Gender: F                 HR:           61 bpm. Exam Location:  [facility]  Procedure: 2D Echo  Indications:    Murmur 785.2 / R01.1  History:        Patient has no prior history of Echocardiogram examinations. Risk Factors:Hypertension.  Sonographer:    Sharon Marsh Referring Phys: Sharon Marsh [facility]  IMPRESSIONS   1. Left ventricular ejection fraction, by estimation, is 60 to 65%. The left ventricle has normal function. The left ventricle has no regional wall motion abnormalities. There is mild concentric left ventricular hypertrophy. Left ventricular diastolic parameters are consistent with Grade I diastolic dysfunction (impaired relaxation). 2. Right ventricular systolic function is normal. The right ventricular size is normal. There is normal pulmonary artery systolic pressure. 3. The mitral valve is degenerative. Mild mitral valve regurgitation. No evidence of mitral stenosis. 4. The aortic valve is tricuspid. Aortic valve regurgitation is mild. No aortic stenosis is present. 5. The inferior vena cava is normal in size with greater than 50% respiratory variability, suggesting right atrial pressure of 3 mmHg.  FINDINGS Left Ventricle: Left ventricular ejection fraction, by estimation, is 60 to 65%. The left ventricle  has normal function. The left ventricle has no regional wall motion abnormalities. The left ventricular internal cavity size was normal in size. There is mild concentric left ventricular hypertrophy. Left ventricular diastolic parameters are consistent with Grade I diastolic dysfunction (impaired relaxation). Indeterminate filling pressures.  Right Ventricle: The right ventricular size is normal. No increase in right ventricular wall thickness. Right ventricular systolic function is normal. There is normal pulmonary artery systolic pressure. The tricuspid regurgitant velocity is 2.19 m/s, and with an assumed right atrial pressure of 3 mmHg, the estimated right ventricular systolic pressure is 22.2 mmHg.  Left Atrium: Left atrial size was normal in size.  Right Atrium: Right atrial size was normal in size.  Pericardium: There is no evidence of pericardial effusion.  Mitral Valve: The mitral valve is degenerative in appearance. Normal mobility of the mitral valve leaflets. Mild mitral annular calcification. Mild mitral valve regurgitation. No evidence of mitral valve stenosis.  Tricuspid Valve: The tricuspid valve is normal in structure. Tricuspid valve regurgitation is trivial. No evidence of tricuspid stenosis.  Aortic Valve: The aortic valve is tricuspid. . There is mild thickening of the aortic valve. Aortic valve regurgitation is mild. Aortic regurgitation PHT measures 509 msec. No aortic stenosis is present. There is mild thickening of  the aortic valve.  Pulmonic Valve: The pulmonic valve was normal in structure. Pulmonic valve regurgitation is not visualized. No evidence of pulmonic stenosis.  Aorta: The aortic root and ascending aorta are structurally normal, with no evidence of dilitation.  Venous: The pulmonary veins were not well visualized. The inferior vena cava is normal in size with greater than 50% respiratory variability, suggesting right atrial pressure of 3 mmHg.  IAS/Shunts:  No atrial level shunt detected by color flow Doppler.   LEFT VENTRICLE PLAX 2D LVIDd:         4.60 cm  Diastology LVIDs:         3.00 cm  LV e' lateral:   3.37 cm/s LV PW:         1.30 cm  LV E/e' lateral: 26.4 LV IVS:        1.30 cm  LV e' medial:    3.26 cm/s LVOT diam:     2.00 cm  LV E/e' medial:  27.2 LV SV:         68 LV SV Index:   37 LVOT Area:     3.14 cm   RIGHT VENTRICLE             IVC RV S prime:     12.30 cm/s  IVC diam: 1.30 cm TAPSE (M-mode): 2.0 cm  LEFT ATRIUM             Index       RIGHT ATRIUM           Index LA diam:        4.50 cm 2.43 cm/m  RA Area:     14.90 cm LA Vol (A2C):   48.4 ml 26.12 ml/m RA Volume:   36.40 ml  19.64 ml/m LA Vol (A4C):   37.0 ml 19.96 ml/m LA Biplane Vol: 43.1 ml 23.26 ml/m AORTIC VALVE LVOT Vmax:   89.40 cm/s LVOT Vmean:  62.100 cm/s LVOT VTI:    0.216 m AI PHT:      509 msec  AORTA Ao Root diam: 3.20 cm Ao Asc diam:  3.40 cm  MITRAL VALVE                TRICUSPID VALVE MV Area (PHT): 3.27 cm     TR Peak grad:   19.2 mmHg MV Decel Time: 232 msec     TR Vmax:        219.00 cm/s MV E velocity: 88.80 cm/s MV A velocity: 106.00 cm/s  SHUNTS MV E/A ratio:  0.84         Systemic VTI:  0.22 m Systemic Diam: 2.00 cm  Redell Leiter MD Electronically signed by Redell Leiter MD Signature Date/Time: 07/30/2019/5:15:16 PM    Final    MONITORS  LONG TERM MONITOR (3-14 DAYS) 07/27/2020  Narrative 12 days of data recorded on Zio monitor. Patient had a min HR of 48 bpm, max HR of 193 bpm, and avg HR of 77 bpm. Predominant underlying rhythm was Sinus Rhythm. 8 Supraventricular Tachycardia runs occurred, the run with the fastest interval lasting 6 beats with a max rate of 193 bpm, the longest lasting 12 beats with an avg rate of 147 bpm. No VT, atrial fibrillation, high degree block, or pauses noted. Isolated atrial and ventricular ectopy was rare (<1%). There were 22 triggered events, which were sinus. No significant  arrhythmias detected.   CT SCANS  CT CARDIAC SCORING (SELF PAY ONLY) 10/01/2019  Addendum 10/01/2019  6:07 PM  ADDENDUM REPORT: 10/01/2019 18:05  CLINICAL DATA:  Risk stratification  EXAM: Coronary Calcium  Score  TECHNIQUE: The patient was scanned on a [employer]. Axial non-contrast 3 mm slices were carried out through the heart. The data set was analyzed on a dedicated work station and scored using the Agatson method.  FINDINGS: Non-cardiac: See separate report from [facility].  Ascending Aorta: Normal  Pericardium: Normal  Coronary arteries: Normal origin  IMPRESSION: Coronary calcium  score of 226 . This was 36 percentile for age and sex matched control.  Kardie Tobb,DO   Electronically Signed By: Kardie  Tobb DO On: 10/01/2019 18:05  Narrative EXAM: OVER-READ INTERPRETATION  CT CHEST  The following report is an over-read performed by radiologist Dr. Marcey Moan of [facility] on 10/01/2019. This over-read does not include interpretation of cardiac or coronary anatomy or pathology. The coronary calcium  score interpretation by the cardiologist is attached.  COMPARISON:  None.  FINDINGS: Vascular: No significant vascular findings. Normal heart size. No pericardial effusion.  Mediastinum/Nodes: No visualized enlarged mediastinal or axillary lymph nodes.  Lungs/Pleura: Visualized lungs show no evidence of pulmonary edema, consolidation, pneumothorax, nodule or pleural fluid.  Upper Abdomen: No acute abnormality.  Musculoskeletal: No chest wall mass or suspicious bone lesions identified.  IMPRESSION: No significant incidental findings.  Electronically Signed: By: Marcey Moan M.D. On: 10/01/2019 13:46     ______________________________________________________________________________________________      Risk Assessment/Calculations          Physical Exam VS:  BP (!) 140/72 (BP Location: Right Arm)    Pulse 70   Ht 5' 1 (1.549 m)   Wt 153 lb (69.4 kg)   SpO2 98%   BMI 28.91 kg/m    Wt Readings from Last 3 Encounters:  06/20/23 153 lb (69.4 kg)  06/06/23 151 lb 8 oz (68.7 kg)  09/27/22 153 lb (69.4 kg)    GEN: Well nourished, well developed in no acute distress NECK: No JVD; No carotid bruits CARDIAC: RRR, no murmurs, rubs, gallops RESPIRATORY:  Clear to auscultation without rales, wheezing or rhonchi  ABDOMEN: Soft, non-tender, non-distended EXTREMITIES:  No edema; No deformity   ASSESSMENT AND PLAN  Assessment & Plan Coronary artery disease s/p DES-LAD 06/2023 Post-stent placement in the left anterior descending artery with previous 80% blockage, now 0% post-stent. Mild 20-30% blockage in the right coronary artery, managed medically. EKG today no acute changes.  - Continue aspirin  and Brilinta  for at least six months, preferably a year. At follow up in 2 month consider change to Plavix  due to cost.  - Encouraged to participate in cardiac rehabilitation for monitored exercise and activity guidance.  Hyperlipidemia with elevated lipoprotein(a) / LDL goal <55 Lipoprotein(a) level significantly elevated at 230, indicating familial hyperlipidemia. Discussed PCSK9 inhibitor (Repatha) as an alternative to lower cholesterol and lipoprotein(a) levels.  - Initiate Repatha with a sample dose today and continue every two weeks. - Reduce simvastatin  to three times per week initially, then discontinue after two months on Repatha. - Schedule follow-up blood work after four doses of Repatha to assess cholesterol levels.  Hypertension Blood pressure target is less than 130/80 mmHg. Current home readings are generally within target range, with occasional spikes post-procedure. Metoprolol  is used for heart rate control and risk reduction, while losartan  is the primary antihypertensive. - Continue current antihypertensive regimen with metoprolol  and losartan . - Monitor blood pressure daily,  especially after medication intake and rest. - If blood pressure exceeds 180 mmHg, rest and recheck after 30  minutes; take additional losartan  if still elevated.  Gastroesophageal reflux disease (GERD) Intermittent chest discomfort likely related to GERD, as symptoms improve with Pepcid. No indication that stent is causing pain. - Continue Pepcid as needed for GERD symptoms. - Consider resuming Prilosec if symptoms persist.  Snoring Not addressed this clinic visit. Discuss at follow up.         Cardiac Rehabilitation Eligibility Assessment  The patient is ready to start cardiac rehabilitation from a cardiac standpoint.       Dispo: follow up in 2 months with Dr. Lonni  Signed, Reche GORMAN Finder, NP

## 2023-06-23 DIAGNOSIS — H900 Conductive hearing loss, bilateral: Secondary | ICD-10-CM | POA: Diagnosis not present

## 2023-06-23 DIAGNOSIS — I10 Essential (primary) hypertension: Secondary | ICD-10-CM | POA: Diagnosis not present

## 2023-06-23 DIAGNOSIS — H6123 Impacted cerumen, bilateral: Secondary | ICD-10-CM | POA: Diagnosis not present

## 2023-06-23 DIAGNOSIS — H9201 Otalgia, right ear: Secondary | ICD-10-CM | POA: Diagnosis not present

## 2023-06-26 ENCOUNTER — Encounter (HOSPITAL_BASED_OUTPATIENT_CLINIC_OR_DEPARTMENT_OTHER): Payer: Self-pay | Admitting: Family

## 2023-06-27 ENCOUNTER — Encounter (HOSPITAL_BASED_OUTPATIENT_CLINIC_OR_DEPARTMENT_OTHER): Payer: Self-pay

## 2023-06-28 ENCOUNTER — Other Ambulatory Visit (HOSPITAL_COMMUNITY): Payer: Self-pay

## 2023-06-28 ENCOUNTER — Telehealth: Payer: Self-pay | Admitting: Pharmacy Technician

## 2023-06-28 NOTE — Telephone Encounter (Signed)
 Pharmacy Patient Advocate Encounter   Received notification from Patient Advice Request messages that prior authorization for Repatha is required/requested.   Insurance verification completed.   The patient is insured through [employer] .   Per test claim: PA required; PA submitted to above mentioned insurance via CoverMyMeds Key/confirmation #/EOC [id number] Status is pending

## 2023-06-28 NOTE — Telephone Encounter (Signed)
 Pharmacy Patient Advocate Encounter  Received notification from [facility] that Prior Authorization for Repatha has been APPROVED from 06/28/23 to 12/28/23. Ran test claim, Copay is $286.32- one month. This test claim was processed through [facility]- copay amounts may vary at other pharmacies due to pharmacy/plan contracts, or as the patient moves through the different stages of their insurance plan.   PA #/Case ID/Reference #: [id number]

## 2023-06-28 NOTE — Telephone Encounter (Signed)
 Sharon Marsh - she was referred for cardiac rehab. She likely is in their queue to be called. CCing Sharon Marsh & team to get her scheduled.   Khalifa Knecht S [name], NP

## 2023-06-29 DIAGNOSIS — M9903 Segmental and somatic dysfunction of lumbar region: Secondary | ICD-10-CM | POA: Diagnosis not present

## 2023-06-29 DIAGNOSIS — L500 Allergic urticaria: Secondary | ICD-10-CM | POA: Diagnosis not present

## 2023-06-29 DIAGNOSIS — L820 Inflamed seborrheic keratosis: Secondary | ICD-10-CM | POA: Diagnosis not present

## 2023-06-29 DIAGNOSIS — M5136 Other intervertebral disc degeneration, lumbar region with discogenic back pain only: Secondary | ICD-10-CM | POA: Diagnosis not present

## 2023-06-29 DIAGNOSIS — Z85828 Personal history of other malignant neoplasm of skin: Secondary | ICD-10-CM | POA: Diagnosis not present

## 2023-06-29 DIAGNOSIS — D0439 Carcinoma in situ of skin of other parts of face: Secondary | ICD-10-CM | POA: Diagnosis not present

## 2023-07-05 ENCOUNTER — Other Ambulatory Visit (HOSPITAL_COMMUNITY): Payer: Self-pay

## 2023-07-10 ENCOUNTER — Telehealth (HOSPITAL_BASED_OUTPATIENT_CLINIC_OR_DEPARTMENT_OTHER): Payer: Self-pay | Admitting: Cardiology

## 2023-07-10 NOTE — Telephone Encounter (Signed)
 Pt c/o BP issue: STAT if pt c/o blurred vision, one-sided weakness or slurred speech.  STAT if BP is GREATER than 180/120 TODAY.  STAT if BP is LESS than 90/60 and SYMPTOMATIC TODAY  1. What is your BP concern? Hypotension  2. Have you taken any BP medication today? Ive taken all prescribed meds today and took my second repeatha shot on Tuesday . Tonite take the brinita and simistatin. Take simistatin  every Other day and brinita twice a day  3. What are your last 5 BP readings? 100/68, 110/68,106/69, 117/65, 100/60.  4. Are you having any other symptoms (ex. Dizziness, headache, blurred vision, passed out)?   Lightheaded, blurry vision, off and on don't feel good,  can't be up long. Have to sit and shortness of breath.  Also  a lot of back pain but took can only take tyonol now and only takes the edge off .     Today is Friday 4th   I've been on blood pressure meds but have been changed since heart attack,  I feel this is just too low for me or reaction from other meds .  I don't know. All this medicine is new to me. One day  may be able to navigate next day no .  Really like to see the doctor before my august visit.   I am concerned .  Answers received via pt schedule, please advise.

## 2023-07-11 ENCOUNTER — Other Ambulatory Visit (HOSPITAL_COMMUNITY): Payer: Self-pay

## 2023-07-11 ENCOUNTER — Encounter (HOSPITAL_BASED_OUTPATIENT_CLINIC_OR_DEPARTMENT_OTHER): Payer: Self-pay | Admitting: Family

## 2023-07-11 ENCOUNTER — Ambulatory Visit (HOSPITAL_BASED_OUTPATIENT_CLINIC_OR_DEPARTMENT_OTHER): Admitting: Family

## 2023-07-11 VITALS — BP 132/78 | HR 73 | Resp 16 | Ht 61.0 in | Wt 155.3 lb

## 2023-07-11 DIAGNOSIS — I25118 Atherosclerotic heart disease of native coronary artery with other forms of angina pectoris: Secondary | ICD-10-CM

## 2023-07-11 DIAGNOSIS — E785 Hyperlipidemia, unspecified: Secondary | ICD-10-CM | POA: Diagnosis not present

## 2023-07-11 DIAGNOSIS — I10 Essential (primary) hypertension: Secondary | ICD-10-CM

## 2023-07-11 NOTE — Progress Notes (Signed)
 Cardiology Office Note   Date:  07/16/2023  ID:  Bassy, Fetterly 1945/02/14, MRN [id number] PCP: Erick Greig DELENA, NP  [facility] Providers Cardiologist:  Shelda Bruckner, MD     History of Present Illness Sharon Marsh is a 78 y.o. female with hx of palpitations, CAD s/p DES-LAD 06/2023, familial hyperlipidemia, HTN.   Admitted 6/3-06/07/23 after transfer from [facility] ED 06/03/23 right right sided chest pain. Echo at [facility] LVEF 55-60%, normal wall motion, no significant valvular abnormalities, normal RV. LHC with 80% prox LAD lesion treated with PCI/DES. Residual 20% osital RCA followed by 30% prox RCA with FFR 0.76, intervention deferred. Recommended for DAPT x 6 months, ideally 1 year. Prior intolerance to statin.   Discussed the use of AI scribe software for clinical note transcription with the patient, who gave verbal consent to proceed.  History of Present Illness At last visit 06/20/23 Repatha  was initiated due to familial hyperlipidemia with Lp(a) 230. Simvastatin  reduced to 3 times per week due to concern for side effects.   Presents today for follow up. She notes after taking her medication int he morning feels lightheaded and winded. Reports Something affecitng my back and shoulders as she feels tight through her shoulders. She also notes more problems with her left knee which has been ongoing. She is uncertain if Simvastatin  is contributory. Systolic blood pressure at home in the morning will be 104-111. By the afternoon it will be a little bit higher.   ROS: Please see the history of present illness.    All other systems reviewed and are negative.   Studies Reviewed      Cardiac Studies & Procedures   ______________________________________________________________________________________________ CARDIAC CATHETERIZATION  CARDIAC CATHETERIZATION 06/06/2023  Conclusion   Prox LAD lesion is 80% stenosed.   Prox RCA lesion is 30% stenosed.   Ost RCA to  Prox RCA lesion is 20% stenosed.   A stent was successfully placed.   Post intervention, there is a 0% residual stenosis.  1.  High-grade, calcified proximal LAD lesion treated with complex OCT guided PCI requiring multiple wires, shockwave lithotripsy, and Cutting Balloon angioplasty with 1 drug-eluting stent placed. 2.  Mild, nonobstructive disease of the RCA and left circumflex. 3.  LVEDP 5 mmHg.  Recommendation: Dual antiplatelet therapy for at least 6 months and preferably 1 year.  Findings Coronary Findings Diagnostic  Dominance: Right  Left Anterior Descending Prox LAD lesion is 80% stenosed.  Left Circumflex The vessel exhibits minimal luminal irregularities.  First Obtuse Marginal Branch The vessel exhibits minimal luminal irregularities.  Second Obtuse Marginal Branch The vessel exhibits minimal luminal irregularities.  Third Obtuse Marginal Branch The vessel exhibits minimal luminal irregularities.  Right Coronary Artery Ost RCA to Prox RCA lesion is 20% stenosed. Prox RCA lesion is 30% stenosed.  Intervention  Prox LAD lesion Stent A stent was successfully placed. Post-Intervention Lesion Assessment The intervention was successful. Pre-interventional TIMI flow is 3. Post-intervention TIMI flow is 3. There is a 0% residual stenosis post intervention.     ECHOCARDIOGRAM  ECHOCARDIOGRAM COMPLETE 07/30/2019  Narrative ECHOCARDIOGRAM REPORT    Patient Name:   TIFFINE HENIGAN Date of Exam: 07/30/2019 Medical Rec #:  [id number]         Height:       63.0 in Accession #:    [id number]        Weight:       181.0 lb Date of Birth:  Jan 27, 1945  BSA:          1.853 m Patient Age:    73 years          BP:           150/80 mmHg Patient Gender: F                 HR:           61 bpm. Exam Location:  [place]  Procedure: 2D Echo  Indications:    Murmur 785.2 / R01.1  History:        Patient has no prior history of Echocardiogram examinations. Risk  Factors:Hypertension.  Sonographer:    Lynwood Silvas Referring Phys: CYRUS JENNIFER SAUNDERS [facility]  IMPRESSIONS   1. Left ventricular ejection fraction, by estimation, is 60 to 65%. The left ventricle has normal function. The left ventricle has no regional wall motion abnormalities. There is mild concentric left ventricular hypertrophy. Left ventricular diastolic parameters are consistent with Grade I diastolic dysfunction (impaired relaxation). 2. Right ventricular systolic function is normal. The right ventricular size is normal. There is normal pulmonary artery systolic pressure. 3. The mitral valve is degenerative. Mild mitral valve regurgitation. No evidence of mitral stenosis. 4. The aortic valve is tricuspid. Aortic valve regurgitation is mild. No aortic stenosis is present. 5. The inferior vena cava is normal in size with greater than 50% respiratory variability, suggesting right atrial pressure of 3 mmHg.  FINDINGS Left Ventricle: Left ventricular ejection fraction, by estimation, is 60 to 65%. The left ventricle has normal function. The left ventricle has no regional wall motion abnormalities. The left ventricular internal cavity size was normal in size. There is mild concentric left ventricular hypertrophy. Left ventricular diastolic parameters are consistent with Grade I diastolic dysfunction (impaired relaxation). Indeterminate filling pressures.  Right Ventricle: The right ventricular size is normal. No increase in right ventricular wall thickness. Right ventricular systolic function is normal. There is normal pulmonary artery systolic pressure. The tricuspid regurgitant velocity is 2.19 m/s, and with an assumed right atrial pressure of 3 mmHg, the estimated right ventricular systolic pressure is 22.2 mmHg.  Left Atrium: Left atrial size was normal in size.  Right Atrium: Right atrial size was normal in size.  Pericardium: There is no evidence of pericardial effusion.  Mitral Valve:  The mitral valve is degenerative in appearance. Normal mobility of the mitral valve leaflets. Mild mitral annular calcification. Mild mitral valve regurgitation. No evidence of mitral valve stenosis.  Tricuspid Valve: The tricuspid valve is normal in structure. Tricuspid valve regurgitation is trivial. No evidence of tricuspid stenosis.  Aortic Valve: The aortic valve is tricuspid. . There is mild thickening of the aortic valve. Aortic valve regurgitation is mild. Aortic regurgitation PHT measures 509 msec. No aortic stenosis is present. There is mild thickening of the aortic valve.  Pulmonic Valve: The pulmonic valve was normal in structure. Pulmonic valve regurgitation is not visualized. No evidence of pulmonic stenosis.  Aorta: The aortic root and ascending aorta are structurally normal, with no evidence of dilitation.  Venous: The pulmonary veins were not well visualized. The inferior vena cava is normal in size with greater than 50% respiratory variability, suggesting right atrial pressure of 3 mmHg.  IAS/Shunts: No atrial level shunt detected by color flow Doppler.   LEFT VENTRICLE PLAX 2D LVIDd:         4.60 cm  Diastology LVIDs:         3.00 cm  LV e' lateral:   3.37  cm/s LV PW:         1.30 cm  LV E/e' lateral: 26.4 LV IVS:        1.30 cm  LV e' medial:    3.26 cm/s LVOT diam:     2.00 cm  LV E/e' medial:  27.2 LV SV:         68 LV SV Index:   37 LVOT Area:     3.14 cm   RIGHT VENTRICLE             IVC RV S prime:     12.30 cm/s  IVC diam: 1.30 cm TAPSE (M-mode): 2.0 cm  LEFT ATRIUM             Index       RIGHT ATRIUM           Index LA diam:        4.50 cm 2.43 cm/m  RA Area:     14.90 cm LA Vol (A2C):   48.4 ml 26.12 ml/m RA Volume:   36.40 ml  19.64 ml/m LA Vol (A4C):   37.0 ml 19.96 ml/m LA Biplane Vol: 43.1 ml 23.26 ml/m AORTIC VALVE LVOT Vmax:   89.40 cm/s LVOT Vmean:  62.100 cm/s LVOT VTI:    0.216 m AI PHT:      509 msec  AORTA Ao Root diam: 3.20  cm Ao Asc diam:  3.40 cm  MITRAL VALVE                TRICUSPID VALVE MV Area (PHT): 3.27 cm     TR Peak grad:   19.2 mmHg MV Decel Time: 232 msec     TR Vmax:        219.00 cm/s MV E velocity: 88.80 cm/s MV A velocity: 106.00 cm/s  SHUNTS MV E/A ratio:  0.84         Systemic VTI:  0.22 m Systemic Diam: 2.00 cm  Redell Leiter MD Electronically signed by Redell Leiter MD Signature Date/Time: 07/30/2019/5:15:16 PM    Final    MONITORS  LONG TERM MONITOR (3-14 DAYS) 07/27/2020  Narrative 12 days of data recorded on Zio monitor. Patient had a min HR of 48 bpm, max HR of 193 bpm, and avg HR of 77 bpm. Predominant underlying rhythm was Sinus Rhythm. 8 Supraventricular Tachycardia runs occurred, the run with the fastest interval lasting 6 beats with a max rate of 193 bpm, the longest lasting 12 beats with an avg rate of 147 bpm. No VT, atrial fibrillation, high degree block, or pauses noted. Isolated atrial and ventricular ectopy was rare (<1%). There were 22 triggered events, which were sinus. No significant arrhythmias detected.   CT SCANS  CT CARDIAC SCORING (SELF PAY ONLY) 10/01/2019  Addendum 10/01/2019  6:07 PM ADDENDUM REPORT: 10/01/2019 18:05  CLINICAL DATA:  Risk stratification  EXAM: Coronary Calcium  Score  TECHNIQUE: The patient was scanned on a [employer] scanner. Axial non-contrast 3 mm slices were carried out through the heart. The data set was analyzed on a dedicated work station and scored using the Agatson method.  FINDINGS: Non-cardiac: See separate report from [facility].  Ascending Aorta: Normal  Pericardium: Normal  Coronary arteries: Normal origin  IMPRESSION: Coronary calcium  score of 226 . This was 72 percentile for age and sex matched control.  Kardie Tobb,DO   Electronically Signed By: Kardie  Tobb DO On: 10/01/2019 18:05  Narrative EXAM: OVER-READ INTERPRETATION  CT CHEST  The following report  is an over-read performed  by radiologist Dr. Marcey Moan of [facility] on 10/01/2019. This over-read does not include interpretation of cardiac or coronary anatomy or pathology. The coronary calcium  score interpretation by the cardiologist is attached.  COMPARISON:  None.  FINDINGS: Vascular: No significant vascular findings. Normal heart size. No pericardial effusion.  Mediastinum/Nodes: No visualized enlarged mediastinal or axillary lymph nodes.  Lungs/Pleura: Visualized lungs show no evidence of pulmonary edema, consolidation, pneumothorax, nodule or pleural fluid.  Upper Abdomen: No acute abnormality.  Musculoskeletal: No chest wall mass or suspicious bone lesions identified.  IMPRESSION: No significant incidental findings.  Electronically Signed: By: Marcey Moan M.D. On: 10/01/2019 13:46     ______________________________________________________________________________________________      Risk Assessment/Calculations          Physical Exam VS:  BP 132/78 (BP Location: Left Arm, Patient Position: Sitting, Cuff Size: Normal)   Pulse 73   Resp 16   Ht 5' 1 (1.549 m)   Wt 155 lb 4.8 oz (70.4 kg)   SpO2 99%   BMI 29.34 kg/m    Wt Readings from Last 3 Encounters:  07/11/23 155 lb 4.8 oz (70.4 kg)  06/20/23 153 lb (69.4 kg)  06/06/23 151 lb 8 oz (68.7 kg)    GEN: Well nourished, well developed in no acute distress NECK: No JVD; No carotid bruits CARDIAC: RRR, no murmurs, rubs, gallops RESPIRATORY:  Clear to auscultation without rales, wheezing or rhonchi  ABDOMEN: Soft, non-tender, non-distended EXTREMITIES:  No edema; No deformity   ASSESSMENT AND PLAN  Assessment & Plan Coronary artery disease s/p DES-LAD 06/2023 Post-stent placement in the left anterior descending artery with previous 80% blockage, now 0% post-stent. Mild 20-30% blockage in the right coronary artery, managed medically. No ischemic symptoms.  - Continue aspirin  and Brilinta  for at least six  months, preferably a year. At follow up or if dyspnea persists consider change to Plavix  due to cost.  - Encouraged to participate in cardiac rehabilitation for monitored exercise and activity guidance. Appropriate to begin.   Hyperlipidemia with elevated lipoprotein(a) / LDL goal <55 Lipoprotein(a) level significantly elevated at 230, indicating familial hyperlipidemia. - Continue Repatha  - Discontinue simvastatin  due to reported worsened musculoskeletal pains  Hypertension Now with reported hypotension and lightheadedness at home. - Continue metoprolol  succinate 50mg  daily -Stop Losartan  - Mychart message in 1 week to check in on BP at home. If still feeling poorly, could consider transition from Metoprolol  Succinate to tartrate as she previously tolerated well. Suspect hypotension more related to Losartan .  - Discussed to monitor BP at home at least 2 hours after medications and sitting for 5-10 minutes.   Snoring Not addressed this clinic visit. Discuss at follow up.             Dispo: follow up as scheduled  Signed, Reche GORMAN Finder, NP

## 2023-07-11 NOTE — Patient Instructions (Addendum)
 Medication Instructions:   STOP Simvastatin   STOP Losartan  (You may take a tablet of Losartan  if your blood pressure is more than 180 for the top number)  CONTINUE Metoprolol  Succinate 50mg  once per day  CONTINUE Repatha    *If you need a refill on your cardiac medications before your next appointment, please call your pharmacy*  Follow-Up: At [facility], you and your health needs are our priority.  As part of our continuing mission to provide you with exceptional heart care, our providers are all part of one team.  This team includes your primary Cardiologist (physician) and Advanced Practice Providers or APPs (Physician Assistants and Nurse Practitioners) who all work together to provide you with the care you need, when you need it.  Your next appointment:   As scheduled  We recommend signing up for the patient portal called MyChart.  Sign up information is provided on this After Visit Summary.  MyChart is used to connect with patients for Virtual Visits (Telemedicine).  Patients are able to view lab/test results, encounter notes, upcoming appointments, etc.  Non-urgent messages can be sent to your provider as well.   To learn more about what you can do with MyChart, go to [email or web address].   Other Instructions  Recommend calling cardiac rehab at [phone] to schedule an appointment  We will reach out next week with MyChart message to check in on BP Check BP once per day at least an hour after medications

## 2023-07-12 ENCOUNTER — Telehealth (HOSPITAL_COMMUNITY): Payer: Self-pay

## 2023-07-12 NOTE — Telephone Encounter (Signed)
 Patient called to get scheduled in the Cardiac Rehab Program. Patient will come in for orientation on 7/17 and will attend the 10:15 exercise class.  Sent MyChart message.

## 2023-07-14 ENCOUNTER — Ambulatory Visit (HOSPITAL_BASED_OUTPATIENT_CLINIC_OR_DEPARTMENT_OTHER)

## 2023-07-16 ENCOUNTER — Encounter (HOSPITAL_BASED_OUTPATIENT_CLINIC_OR_DEPARTMENT_OTHER): Payer: Self-pay

## 2023-07-20 ENCOUNTER — Encounter (HOSPITAL_COMMUNITY)
Admission: RE | Admit: 2023-07-20 | Discharge: 2023-07-20 | Disposition: A | Discharge status: Home / Self Care | Source: Ambulatory Visit | Attending: Cardiology | Admitting: Cardiology

## 2023-07-20 ENCOUNTER — Telehealth: Payer: Self-pay | Admitting: Family

## 2023-07-20 ENCOUNTER — Telehealth: Payer: Self-pay

## 2023-07-20 ENCOUNTER — Other Ambulatory Visit (HOSPITAL_COMMUNITY): Payer: Self-pay

## 2023-07-20 VITALS — BP 122/64 | HR 71 | Ht 61.0 in | Wt 156.3 lb

## 2023-07-20 DIAGNOSIS — Z955 Presence of coronary angioplasty implant and graft: Secondary | ICD-10-CM | POA: Insufficient documentation

## 2023-07-20 DIAGNOSIS — I214 Non-ST elevation (NSTEMI) myocardial infarction: Secondary | ICD-10-CM | POA: Insufficient documentation

## 2023-07-20 NOTE — Progress Notes (Signed)
 Cardiac Rehab Medication Review by a Nurse  Does the patient  feel that his/her medications are working for him/her?  yes  Has the patient been experiencing any side effects to the medications prescribed?  no  Does the patient measure his/her own blood pressure or blood glucose at home?  yes   Does the patient have any problems obtaining medications due to transportation or finances?   yes  Understanding of regimen: good Understanding of indications: good Potential of compliance: good    Nurse comments: [name] had her Brillinta filled at [facility] and is concerned about the cost as she is on a fixed income. [facility] pharmacy called so that the Brillinta prescription can be filled there. Sharon Marsh has enough Brillinta to last her until the end of August. Dr Christopher's office was called to see if Sharon Marsh will qualify for medication assistance with her Brillinta and Repatha . The medication assistance team will contact the patient. Sharon Marsh says that she takes her blood pressures daily. Sharon Marsh has an intolerance to statins.    Sharon Marsh [facility] RN 07/20/2023 11:53 AM

## 2023-07-20 NOTE — Telephone Encounter (Signed)
 No assistance available for Brilinta . Enrolled in grant for Repatha . Lorrene info faxed to pharmacy. Patient informed via mychart.

## 2023-07-20 NOTE — Telephone Encounter (Signed)
 Pt c/o medication issue:  1. Name of Medication:  ticagrelor  (BRILINTA ) 90 MG TABS tablet   Evolocumab  (REPATHA  SURECLICK) 140 MG/ML SOAJ    2. How are you currently taking this medication (dosage and times per day)? As written   3. Are you having a reaction (difficulty breathing--STAT)? No   4. What is your medication issue? Maria with Cardiac Rehab called in stating pt is concerned about the costs of these and asked if there are any patient assistance options for her. Please advise.

## 2023-07-20 NOTE — Progress Notes (Signed)
 Cardiac Individual Treatment Plan  Patient Details  Name: Sharon Marsh MRN: [id number] Date of Birth: [birth year] Referring Provider:   Flowsheet Row INTENSIVE CARDIAC REHAB ORIENT from 07/20/2023 in [facility]  Referring Provider Shelda Grizzle, MD    Initial Encounter Date:  Flowsheet Row INTENSIVE CARDIAC REHAB ORIENT from 07/20/2023 in [facility]  Date 07/20/23    Visit Diagnosis: 05/16/23 NSTEMI (non-ST elevated myocardial infarction) (HCC)  06/06/23 DES LAD  Patient's Home Medications on Admission:  Current Outpatient Medications:    ACETYLCYSTEINE PO, Take 1 each by mouth daily. Also known as NAC (pt knows as this) - Take 1 capsules by mouth once daily. May also take extra if needed., Disp: , Rfl:    Ascorbic Acid 1000 MG CHEW, Chew 1,000 mg by mouth daily., Disp: , Rfl:    aspirin  EC 81 MG tablet, Take 1 tablet (81 mg total) by mouth daily. Swallow whole., Disp: 150 tablet, Rfl: 2   b complex vitamins tablet, Take 1 tablet by mouth daily., Disp: , Rfl:    cholecalciferol (VITAMIN D3) 25 MCG (1000 UNIT) tablet, Take 1,000 Units by mouth daily., Disp: , Rfl:    Coenzyme Q10 200 MG capsule, Take by mouth daily., Disp: , Rfl:    Collagen Hydrolysate POWD, 1 Scoop by Does not apply route daily., Disp: , Rfl:    Evolocumab  (REPATHA  SURECLICK) 140 MG/ML SOAJ, Inject 140 mg into the skin every 14 (fourteen) days., Disp: 2 mL, Rfl: 6   MAGNESIUM PO, Take 2 each by mouth at bedtime. Take 2 gummies by mouth every night, Disp: , Rfl:    metoprolol  succinate (TOPROL  XL) 50 MG 24 hr tablet, Take 1 tablet (50 mg total) by mouth daily. Take with or immediately following a meal., Disp: 90 tablet, Rfl: 3   nitroGLYCERIN  (NITROSTAT ) 0.4 MG SL tablet, Place 1 tablet (0.4 mg total) under the tongue every 5 (five) minutes as needed for chest pain., Disp: 25 tablet, Rfl: 3    omeprazole (PRILOSEC) 20 MG capsule, Take 20 mg by mouth daily., Disp: , Rfl:    ticagrelor  (BRILINTA ) 90 MG TABS tablet, Take 1 tablet (90 mg total) by mouth 2 (two) times daily., Disp: 180 tablet, Rfl: 3   zolpidem (AMBIEN) 10 MG tablet, Take 10 mg by mouth at bedtime., Disp: , Rfl:   Past Medical History: 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   Obstructive sleep apnea    Osteoarthritis of left knee 08/09/2017   Osteoarthritis of right knee 10/03/2018   Osteoporosis 02/12/2019   Other and unspecified hyperlipidemia    Personal history of vulvar dysplasia    PONV (postoperative nausea and vomiting)    Unspecified essential hypertension 08/15/2013   Unspecified hereditary and idiopathic peripheral neuropathy 08/15/2013    Tobacco Use: Social History   Tobacco Use  Smoking Status Former  Smokeless Tobacco Never    Labs: Review Flowsheet       Latest Ref Rng & Units 10/29/2019 06/07/2023  Labs for ITP Cardiac and Pulmonary Rehab  Cholestrol 0 - 200 mg/dL -  222   LDL (calc) 0 - 99 mg/dL - 862   HDL-C >59 mg/dL - 48   Trlycerides <849 mg/dL - 813   TCO2 22 - 32 mmol/L 23  -    Capillary Blood Glucose: No results found for: GLUCAP   Exercise Target Goals: Exercise Program Goal: Individual exercise prescription set using results from initial 6 min walk test and THRR while considering  patient's activity barriers and safety.   Exercise Prescription Goal: Initial exercise prescription builds to 30-45 minutes a day of aerobic activity, 2-3 days per week.  Home exercise guidelines will be given to patient during program as part of exercise prescription that the participant will  acknowledge.  Activity Barriers & Risk Stratification:  Activity Barriers & Cardiac Risk Stratification - 07/20/23 1133       Activity Barriers & Cardiac Risk Stratification   Activity Barriers Arthritis;Joint Problems;Assistive Device;Deconditioning;Balance Concerns;History of Falls;Neck/Spine Problems    Cardiac Risk Stratification High   <5 METs on         6 Minute Walk:  6 Minute Walk     Row Name 07/20/23 1342         6 Minute Walk   Phase Initial     Distance 1130 feet     Walk Time 6 minutes     # of Rest Breaks 0     MPH 2.14     METS 2.06     RPE 10.5     Perceived Dyspnea  0     VO2 Peak 7.22     Symptoms No     Resting HR 71 bpm     Resting BP 122/64     Resting Oxygen Saturation  100 %     Exercise Oxygen Saturation  during 6 min walk 99 %     Max Ex. HR 118 bpm     Max Ex. BP 130/66     2 Minute Post BP 114/68        Oxygen Initial Assessment:   Oxygen Re-Evaluation:   Oxygen Discharge (Final Oxygen Re-Evaluation):   Initial Exercise Prescription:  Initial Exercise Prescription - 07/20/23 1100       Date of Initial Exercise RX and Referring Provider   Date 07/20/23    Referring Provider Shelda Grizzle, MD    Expected Discharge Date 10/11/23      NuStep   Level 1    SPM 60    Minutes 15    METs 2      Track   Laps 12    Minutes 15    METs 2      Prescription Details   Frequency (times per week) 3    Duration Progress to 30 minutes of continuous aerobic without signs/symptoms of physical distress      Intensity   THRR 40-80% of Max Heartrate 56-112    Ratings of Perceived Exertion 11-13    Perceived Dyspnea 0-4      Progression   Progression Continue progressive overload as per policy without signs/symptoms or physical distress.      Resistance Training   Training Prescription Yes    Weight 2    Reps 10-15          Perform Capillary Blood Glucose checks as needed.  Exercise Prescription  Changes:   Exercise Comments:   Exercise Goals and Review:   Exercise Goals     Row Name 07/20/23 1133  Exercise Goals   Increase Physical Activity Yes       Intervention Provide advice, education, support and counseling about physical activity/exercise needs.;Develop an individualized exercise prescription for aerobic and resistive training based on initial evaluation findings, risk stratification, comorbidities and participant's personal goals.       Expected Outcomes Short Term: Attend rehab on a regular basis to increase amount of physical activity.;Long Term: Exercising regularly at least 3-5 days a week.;Long Term: Add in home exercise to make exercise part of routine and to increase amount of physical activity.       Increase Strength and Stamina Yes       Intervention Provide advice, education, support and counseling about physical activity/exercise needs.;Develop an individualized exercise prescription for aerobic and resistive training based on initial evaluation findings, risk stratification, comorbidities and participant's personal goals.       Expected Outcomes Short Term: Increase workloads from initial exercise prescription for resistance, speed, and METs.;Short Term: Perform resistance training exercises routinely during rehab and add in resistance training at home;Long Term: Improve cardiorespiratory fitness, muscular endurance and strength as measured by increased METs and functional capacity ( )       Able to understand and use rate of perceived exertion (RPE) scale Yes       Intervention Provide education and explanation on how to use RPE scale       Expected Outcomes Short Term: Able to use RPE daily in rehab to express subjective intensity level;Long Term:  Able to use RPE to guide intensity level when exercising independently       Knowledge and understanding of Target Heart Rate Range (THRR) Yes       Intervention Provide education and explanation of  THRR including how the numbers were predicted and where they are located for reference       Expected Outcomes Short Term: Able to state/look up THRR;Long Term: Able to use THRR to govern intensity when exercising independently;Short Term: Able to use daily as guideline for intensity in rehab       Understanding of Exercise Prescription Yes       Intervention Provide education, explanation, and written materials on patient's individual exercise prescription       Expected Outcomes Short Term: Able to explain program exercise prescription;Long Term: Able to explain home exercise prescription to exercise independently          Exercise Goals Re-Evaluation :   Discharge Exercise Prescription (Final Exercise Prescription Changes):   Nutrition:  Target Goals: Understanding of nutrition guidelines, daily intake of sodium 1500mg , cholesterol 200mg , calories 30% from fat and 7% or less from saturated fats, daily to have 5 or more servings of fruits and vegetables.  Biometrics:  Pre Biometrics - 07/20/23 1132       Pre Biometrics   Waist Circumference 35.5 inches    Hip Circumference 43 inches    Waist to Hip Ratio 0.83 %    Triceps Skinfold 26 mm    % Body Fat 41.1 %    Grip Strength 20 kg    Flexibility --   back pain- not done   Single Leg Stand 3.62 seconds           Nutrition Therapy Plan and Nutrition Goals:   Nutrition Assessments:  MEDIFICTS Score Key: >=70 Need to make dietary changes  40-70 Heart Healthy Diet <= 40 Therapeutic Level Cholesterol Diet    Picture Your Plate Scores: <59 Unhealthy dietary pattern with much room for improvement. 41-50 Dietary  pattern unlikely to meet recommendations for good health and room for improvement. 51-60 More healthful dietary pattern, with some room for improvement.  >60 Healthy dietary pattern, although there may be some specific behaviors that could be improved.    Nutrition Goals Re-Evaluation:   Nutrition Goals  Re-Evaluation:   Nutrition Goals Discharge (Final Nutrition Goals Re-Evaluation):   Psychosocial: Target Goals: Acknowledge presence or absence of significant depression and/or stress, maximize coping skills, provide positive support system. Participant is able to verbalize types and ability to use techniques and skills needed for reducing stress and depression.  Initial Review & Psychosocial Screening:  Initial Psych Review & Screening - 07/20/23 1155       Initial Review   Current issues with None Identified      Family Dynamics   Good Support System? Yes   family and friends     Barriers   Psychosocial barriers to participate in program There are no identifiable barriers or psychosocial needs.      Screening Interventions   Interventions Encouraged to exercise;Provide feedback about the scores to participant    Expected Outcomes Long Term goal: The participant improves quality of Life and PHQ9 Scores as seen by post scores and/or verbalization of changes;Short Term goal: Identification and review with participant of any Quality of Life or Depression concerns found by scoring the questionnaire.          Quality of Life Scores:  Quality of Life - 07/20/23 1343       Quality of Life   Select Quality of Life      Quality of Life Scores   Health/Function Pre 21.37 %    Socioeconomic Pre 23 %    Psych/Spiritual Pre 24.64 %    Family Pre 22.5 %    GLOBAL Pre 22.55 %         Scores of 19 and below usually indicate a poorer quality of life in these areas.  A difference of  2-3 points is a clinically meaningful difference.  A difference of 2-3 points in the total score of the Quality of Life Index has been associated with significant improvement in overall quality of life, self-image, physical symptoms, and general health in studies assessing change in quality of life.  PHQ-9: Review Flowsheet       07/20/2023  Depression screen PHQ 2/9  Decreased Interest 0  Down,  Depressed, Hopeless 0  PHQ - 2 Score 0  Altered sleeping 3  Tired, decreased energy 0  Change in appetite 0  Feeling bad or failure about yourself  0  Trouble concentrating 0  Moving slowly or fidgety/restless 0  Suicidal thoughts 0  PHQ-9 Score 3  Difficult doing work/chores Somewhat difficult   Interpretation of Total Score  Total Score Depression Severity:  1-4 = Minimal depression, 5-9 = Mild depression, 10-14 = Moderate depression, 15-19 = Moderately severe depression, 20-27 = Severe depression   Psychosocial Evaluation and Intervention:   Psychosocial Re-Evaluation:   Psychosocial Discharge (Final Psychosocial Re-Evaluation):   Vocational Rehabilitation: Provide vocational rehab assistance to qualifying candidates.   Vocational Rehab Evaluation & Intervention:  Vocational Rehab - 07/20/23 1155       Initial Vocational Rehab Evaluation & Intervention   Assessment shows need for Vocational Rehabilitation No   retired         Education: Education Goals: Education classes will be provided on a weekly basis, covering required topics. Participant will state understanding/return demonstration of topics presented.     Core  Videos: Exercise    Move It!  Clinical staff conducted group or individual video education with verbal and written material and guidebook.  Patient learns the recommended Pritikin exercise program. Exercise with the goal of living a long, healthy life. Some of the health benefits of exercise include controlled diabetes, healthier blood pressure levels, improved cholesterol levels, improved heart and lung capacity, improved sleep, and better body composition. Everyone should speak with their doctor before starting or changing an exercise routine.  Biomechanical Limitations Clinical staff conducted group or individual video education with verbal and written material and guidebook.  Patient learns how biomechanical limitations can impact exercise and  how we can mitigate and possibly overcome limitations to have an impactful and balanced exercise routine.  Body Composition Clinical staff conducted group or individual video education with verbal and written material and guidebook.  Patient learns that body composition (ratio of muscle mass to fat mass) is a key component to assessing overall fitness, rather than body weight alone. Increased fat mass, especially visceral belly fat, can put us  at increased risk for metabolic syndrome, type 2 diabetes, heart disease, and even death. It is recommended to combine diet and exercise (cardiovascular and resistance training) to improve your body composition. Seek guidance from your physician and exercise physiologist before implementing an exercise routine.  Exercise Action Plan Clinical staff conducted group or individual video education with verbal and written material and guidebook.  Patient learns the recommended strategies to achieve and enjoy long-term exercise adherence, including variety, self-motivation, self-efficacy, and positive decision making. Benefits of exercise include fitness, good health, weight management, more energy, better sleep, less stress, and overall well-being.  Medical   Heart Disease Risk Reduction Clinical staff conducted group or individual video education with verbal and written material and guidebook.  Patient learns our heart is our most vital organ as it circulates oxygen, nutrients, white blood cells, and hormones throughout the entire body, and carries waste away. Data supports a plant-based eating plan like the Pritikin Program for its effectiveness in slowing progression of and reversing heart disease. The video provides a number of recommendations to address heart disease.   Metabolic Syndrome and Belly Fat  Clinical staff conducted group or individual video education with verbal and written material and guidebook.  Patient learns what metabolic syndrome is, how it  leads to heart disease, and how one can reverse it and keep it from coming back. You have metabolic syndrome if you have 3 of the following 5 criteria: abdominal obesity, high blood pressure, high triglycerides, low HDL cholesterol, and high blood sugar.  Hypertension and Heart Disease Clinical staff conducted group or individual video education with verbal and written material and guidebook.  Patient learns that high blood pressure, or hypertension, is very common in the [place] . Hypertension is largely due to excessive salt intake, but other important risk factors include being overweight, physical inactivity, drinking too much alcohol, smoking, and not eating enough potassium from fruits and vegetables. High blood pressure is a leading risk factor for heart attack, stroke, congestive heart failure, dementia, kidney failure, and premature death. Long-term effects of excessive salt intake include stiffening of the arteries and thickening of heart muscle and organ damage. Recommendations include ways to reduce hypertension and the risk of heart disease.  Diseases of Our Time - Focusing on Diabetes Clinical staff conducted group or individual video education with verbal and written material and guidebook.  Patient learns why the best way to stop diseases of our time is  prevention, through food and other lifestyle changes. Medicine (such as prescription pills and surgeries) is often only a Band-Aid on the problem, not a long-term solution. Most common diseases of our time include obesity, type 2 diabetes, hypertension, heart disease, and cancer. The Pritikin Program is recommended and has been proven to help reduce, reverse, and/or prevent the damaging effects of metabolic syndrome.  Nutrition   Overview of the Pritikin Eating Plan  Clinical staff conducted group or individual video education with verbal and written material and guidebook.  Patient learns about the Pritikin Eating Plan for disease  risk reduction. The Pritikin Eating Plan emphasizes a wide variety of unrefined, minimally-processed carbohydrates, like fruits, vegetables, whole grains, and legumes. Go, Caution, and Stop food choices are explained. Plant-based and lean animal proteins are emphasized. Rationale provided for low sodium intake for blood pressure control, low added sugars for blood sugar stabilization, and low added fats and oils for coronary artery disease risk reduction and weight management.  Calorie Density  Clinical staff conducted group or individual video education with verbal and written material and guidebook.  Patient learns about calorie density and how it impacts the Pritikin Eating Plan. Knowing the characteristics of the food you choose will help you decide whether those foods will lead to weight gain or weight loss, and whether you want to consume more or less of them. Weight loss is usually a side effect of the Pritikin Eating Plan because of its focus on low calorie-dense foods.  Label Reading  Clinical staff conducted group or individual video education with verbal and written material and guidebook.  Patient learns about the Pritikin recommended label reading guidelines and corresponding recommendations regarding calorie density, added sugars, sodium content, and whole grains.  Dining Out - Part 1  Clinical staff conducted group or individual video education with verbal and written material and guidebook.  Patient learns that restaurant meals can be sabotaging because they can be so high in calories, fat, sodium, and/or sugar. Patient learns recommended strategies on how to positively address this and avoid unhealthy pitfalls.  Facts on Fats  Clinical staff conducted group or individual video education with verbal and written material and guidebook.  Patient learns that lifestyle modifications can be just as effective, if not more so, as many medications for lowering your risk of heart disease. A  Pritikin lifestyle can help to reduce your risk of inflammation and atherosclerosis (cholesterol build-up, or plaque, in the artery walls). Lifestyle interventions such as dietary choices and physical activity address the cause of atherosclerosis. A review of the types of fats and their impact on blood cholesterol levels, along with dietary recommendations to reduce fat intake is also included.  Nutrition Action Plan  Clinical staff conducted group or individual video education with verbal and written material and guidebook.  Patient learns how to incorporate Pritikin recommendations into their lifestyle. Recommendations include planning and keeping personal health goals in mind as an important part of their success.  Healthy Mind-Set    Healthy Minds, Bodies, Hearts  Clinical staff conducted group or individual video education with verbal and written material and guidebook.  Patient learns how to identify when they are stressed. Video will discuss the impact of that stress, as well as the many benefits of stress management. Patient will also be introduced to stress management techniques. The way we think, act, and feel has an impact on our hearts.  How Our Thoughts Can Heal Our Hearts  Clinical staff conducted group or individual video education  with verbal and written material and guidebook.  Patient learns that negative thoughts can cause depression and anxiety. This can result in negative lifestyle behavior and serious health problems. Cognitive behavioral therapy is an effective method to help control our thoughts in order to change and improve our emotional outlook.  Additional Videos:  Exercise    Improving Performance  Clinical staff conducted group or individual video education with verbal and written material and guidebook.  Patient learns to use a non-linear approach by alternating intensity levels and lengths of time spent exercising to help burn more calories and lose more body fat.  Cardiovascular exercise helps improve heart health, metabolism, hormonal balance, blood sugar control, and recovery from fatigue. Resistance training improves strength, endurance, balance, coordination, reaction time, metabolism, and muscle mass. Flexibility exercise improves circulation, posture, and balance. Seek guidance from your physician and exercise physiologist before implementing an exercise routine and learn your capabilities and proper form for all exercise.  Introduction to Yoga  Clinical staff conducted group or individual video education with verbal and written material and guidebook.  Patient learns about yoga, a discipline of the coming together of mind, breath, and body. The benefits of yoga include improved flexibility, improved range of motion, better posture and core strength, increased lung function, weight loss, and positive self-image. Yoga's heart health benefits include lowered blood pressure, healthier heart rate, decreased cholesterol and triglyceride levels, improved immune function, and reduced stress. Seek guidance from your physician and exercise physiologist before implementing an exercise routine and learn your capabilities and proper form for all exercise.  Medical   Aging: Enhancing Your Quality of Life  Clinical staff conducted group or individual video education with verbal and written material and guidebook.  Patient learns key strategies and recommendations to stay in good physical health and enhance quality of life, such as prevention strategies, having an advocate, securing a Health Care Proxy and Power of Attorney, and keeping a list of medications and system for tracking them. It also discusses how to avoid risk for bone loss.  Biology of Weight Control  Clinical staff conducted group or individual video education with verbal and written material and guidebook.  Patient learns that weight gain occurs because we consume more calories than we burn (eating more,  moving less). Even if your body weight is normal, you may have higher ratios of fat compared to muscle mass. Too much body fat puts you at increased risk for cardiovascular disease, heart attack, stroke, type 2 diabetes, and obesity-related cancers. In addition to exercise, following the Pritikin Eating Plan can help reduce your risk.  Decoding Lab Results  Clinical staff conducted group or individual video education with verbal and written material and guidebook.  Patient learns that lab test reflects one measurement whose values change over time and are influenced by many factors, including medication, stress, sleep, exercise, food, hydration, pre-existing medical conditions, and more. It is recommended to use the knowledge from this video to become more involved with your lab results and evaluate your numbers to speak with your doctor.   Diseases of Our Time - Overview  Clinical staff conducted group or individual video education with verbal and written material and guidebook.  Patient learns that according to the CDC, 50% to 70% of chronic diseases (such as obesity, type 2 diabetes, elevated lipids, hypertension, and heart disease) are avoidable through lifestyle improvements including healthier food choices, listening to satiety cues, and increased physical activity.  Sleep Disorders Clinical staff conducted group or individual video  education with verbal and written material and guidebook.  Patient learns how good quality and duration of sleep are important to overall health and well-being. Patient also learns about sleep disorders and how they impact health along with recommendations to address them, including discussing with a physician.  Nutrition  Dining Out - Part 2 Clinical staff conducted group or individual video education with verbal and written material and guidebook.  Patient learns how to plan ahead and communicate in order to maximize their dining experience in a healthy and  nutritious manner. Included are recommended food choices based on the type of restaurant the patient is visiting.   Fueling a Banker conducted group or individual video education with verbal and written material and guidebook.  There is a strong connection between our food choices and our health. Diseases like obesity and type 2 diabetes are very prevalent and are in large-part due to lifestyle choices. The Pritikin Eating Plan provides plenty of food and hunger-curbing satisfaction. It is easy to follow, affordable, and helps reduce health risks.  Menu Workshop  Clinical staff conducted group or individual video education with verbal and written material and guidebook.  Patient learns that restaurant meals can sabotage health goals because they are often packed with calories, fat, sodium, and sugar. Recommendations include strategies to plan ahead and to communicate with the manager, chef, or server to help order a healthier meal.  Planning Your Eating Strategy  Clinical staff conducted group or individual video education with verbal and written material and guidebook.  Patient learns about the Pritikin Eating Plan and its benefit of reducing the risk of disease. The Pritikin Eating Plan does not focus on calories. Instead, it emphasizes high-quality, nutrient-rich foods. By knowing the characteristics of the foods, we choose, we can determine their calorie density and make informed decisions.  Targeting Your Nutrition Priorities  Clinical staff conducted group or individual video education with verbal and written material and guidebook.  Patient learns that lifestyle habits have a tremendous impact on disease risk and progression. This video provides eating and physical activity recommendations based on your personal health goals, such as reducing LDL cholesterol, losing weight, preventing or controlling type 2 diabetes, and reducing high blood pressure.  Vitamins and  Minerals  Clinical staff conducted group or individual video education with verbal and written material and guidebook.  Patient learns different ways to obtain key vitamins and minerals, including through a recommended healthy diet. It is important to discuss all supplements you take with your doctor.   Healthy Mind-Set    Smoking Cessation  Clinical staff conducted group or individual video education with verbal and written material and guidebook.  Patient learns that cigarette smoking and tobacco addiction pose a serious health risk which affects millions of people. Stopping smoking will significantly reduce the risk of heart disease, lung disease, and many forms of cancer. Recommended strategies for quitting are covered, including working with your doctor to develop a successful plan.  Culinary   Becoming a Set designer conducted group or individual video education with verbal and written material and guidebook.  Patient learns that cooking at home can be healthy, cost-effective, quick, and puts them in control. Keys to cooking healthy recipes will include looking at your recipe, assessing your equipment needs, planning ahead, making it simple, choosing cost-effective seasonal ingredients, and limiting the use of added fats, salts, and sugars.  Cooking - Breakfast and Snacks  Clinical staff conducted group or individual video  education with verbal and written material and guidebook.  Patient learns how important breakfast is to satiety and nutrition through the entire day. Recommendations include key foods to eat during breakfast to help stabilize blood sugar levels and to prevent overeating at meals later in the day. Planning ahead is also a key component.  Cooking - Educational psychologist conducted group or individual video education with verbal and written material and guidebook.  Patient learns eating strategies to improve overall health, including an approach  to cook more at home. Recommendations include thinking of animal protein as a side on your plate rather than center stage and focusing instead on lower calorie dense options like vegetables, fruits, whole grains, and plant-based proteins, such as beans. Making sauces in large quantities to freeze for later and leaving the skin on your vegetables are also recommended to maximize your experience.  Cooking - Healthy Salads and Dressing Clinical staff conducted group or individual video education with verbal and written material and guidebook.  Patient learns that vegetables, fruits, whole grains, and legumes are the foundations of the Pritikin Eating Plan. Recommendations include how to incorporate each of these in flavorful and healthy salads, and how to create homemade salad dressings. Proper handling of ingredients is also covered. Cooking - Soups and [employer] - Soups and Desserts Clinical staff conducted group or individual video education with verbal and written material and guidebook.  Patient learns that Pritikin soups and desserts make for easy, nutritious, and delicious snacks and meal components that are low in sodium, fat, sugar, and calorie density, while high in vitamins, minerals, and filling fiber. Recommendations include simple and healthy ideas for soups and desserts.   Overview     The Pritikin Solution Program Overview Clinical staff conducted group or individual video education with verbal and written material and guidebook.  Patient learns that the results of the Pritikin Program have been documented in more than 100 articles published in peer-reviewed journals, and the benefits include reducing risk factors for (and, in some cases, even reversing) high cholesterol, high blood pressure, type 2 diabetes, obesity, and more! An overview of the three key pillars of the Pritikin Program will be covered: eating well, doing regular exercise, and having a healthy  mind-set.  WORKSHOPS  Exercise: Exercise Basics: Building Your Action Plan Clinical staff led group instruction and group discussion with PowerPoint presentation and patient guidebook. To enhance the learning environment the use of posters, models and videos may be added. At the conclusion of this workshop, patients will comprehend the difference between physical activity and exercise, as well as the benefits of incorporating both, into their routine. Patients will understand the FITT (Frequency, Intensity, Time, and Type) principle and how to use it to build an exercise action plan. In addition, safety concerns and other considerations for exercise and cardiac rehab will be addressed by the presenter. The purpose of this lesson is to promote a comprehensive and effective weekly exercise routine in order to improve patients' overall level of fitness.   Managing Heart Disease: Your Path to a Healthier Heart Clinical staff led group instruction and group discussion with PowerPoint presentation and patient guidebook. To enhance the learning environment the use of posters, models and videos may be added.At the conclusion of this workshop, patients will understand the anatomy and physiology of the heart. Additionally, they will understand how Pritikin's three pillars impact the risk factors, the progression, and the management of heart disease.  The purpose of this  lesson is to provide a high-level overview of the heart, heart disease, and how the Pritikin lifestyle positively impacts risk factors.  Exercise Biomechanics Clinical staff led group instruction and group discussion with PowerPoint presentation and patient guidebook. To enhance the learning environment the use of posters, models and videos may be added. Patients will learn how the structural parts of their bodies function and how these functions impact their daily activities, movement, and exercise. Patients will learn how to promote a  neutral spine, learn how to manage pain, and identify ways to improve their physical movement in order to promote healthy living. The purpose of this lesson is to expose patients to common physical limitations that impact physical activity. Participants will learn practical ways to adapt and manage aches and pains, and to minimize their effect on regular exercise. Patients will learn how to maintain good posture while sitting, walking, and lifting.  Balance Training and Fall Prevention  Clinical staff led group instruction and group discussion with PowerPoint presentation and patient guidebook. To enhance the learning environment the use of posters, models and videos may be added. At the conclusion of this workshop, patients will understand the importance of their sensorimotor skills (vision, proprioception, and the vestibular system) in maintaining their ability to balance as they age. Patients will apply a variety of balancing exercises that are appropriate for their current level of function. Patients will understand the common causes for poor balance, possible solutions to these problems, and ways to modify their physical environment in order to minimize their fall risk. The purpose of this lesson is to teach patients about the importance of maintaining balance as they age and ways to minimize their risk of falling.  WORKSHOPS   Nutrition:  Fueling a Ship broker led group instruction and group discussion with PowerPoint presentation and patient guidebook. To enhance the learning environment the use of posters, models and videos may be added. Patients will review the foundational principles of the Pritikin Eating Plan and understand what constitutes a serving size in each of the food groups. Patients will also learn Pritikin-friendly foods that are better choices when away from home and review make-ahead meal and snack options. Calorie density will be reviewed and applied to  three nutrition priorities: weight maintenance, weight loss, and weight gain. The purpose of this lesson is to reinforce (in a group setting) the key concepts around what patients are recommended to eat and how to apply these guidelines when away from home by planning and selecting Pritikin-friendly options. Patients will understand how calorie density may be adjusted for different weight management goals.  Mindful Eating  Clinical staff led group instruction and group discussion with PowerPoint presentation and patient guidebook. To enhance the learning environment the use of posters, models and videos may be added. Patients will briefly review the concepts of the Pritikin Eating Plan and the importance of low-calorie dense foods. The concept of mindful eating will be introduced as well as the importance of paying attention to internal hunger signals. Triggers for non-hunger eating and techniques for dealing with triggers will be explored. The purpose of this lesson is to provide patients with the opportunity to review the basic principles of the Pritikin Eating Plan, discuss the value of eating mindfully and how to measure internal cues of hunger and fullness using the Hunger Scale. Patients will also discuss reasons for non-hunger eating and learn strategies to use for controlling emotional eating.  Targeting Your Nutrition Priorities Clinical staff led group instruction and  group discussion with PowerPoint presentation and patient guidebook. To enhance the learning environment the use of posters, models and videos may be added. Patients will learn how to determine their genetic susceptibility to disease by reviewing their family history. Patients will gain insight into the importance of diet as part of an overall healthy lifestyle in mitigating the impact of genetics and other environmental insults. The purpose of this lesson is to provide patients with the opportunity to assess their personal nutrition  priorities by looking at their family history, their own health history and current risk factors. Patients will also be able to discuss ways of prioritizing and modifying the Pritikin Eating Plan for their highest risk areas  Menu  Clinical staff led group instruction and group discussion with PowerPoint presentation and patient guidebook. To enhance the learning environment the use of posters, models and videos may be added. Using menus brought in from [employer], or printed from [employer], patients will apply the Pritikin dining out guidelines that were presented in the [employer] video. Patients will also be able to practice these guidelines in a variety of provided scenarios. The purpose of this lesson is to provide patients with the opportunity to practice hands-on learning of the Pritikin Dining Out guidelines with actual menus and practice scenarios.  Label Reading Clinical staff led group instruction and group discussion with PowerPoint presentation and patient guidebook. To enhance the learning environment the use of posters, models and videos may be added. Patients will review and discuss the Pritikin label reading guidelines presented in Pritikin's Label Reading Educational series video. Using fool labels brought in from local grocery stores and markets, patients will apply the label reading guidelines and determine if the packaged food meet the Pritikin guidelines. The purpose of this lesson is to provide patients with the opportunity to review, discuss, and practice hands-on learning of the Pritikin Label Reading guidelines with actual packaged food labels. Cooking School  Pritikin's [employer] are designed to teach patients ways to prepare quick, simple, and affordable recipes at home. The importance of nutrition's role in chronic disease risk reduction is reflected in its emphasis in the overall Pritikin program. By learning how to prepare essential  core Pritikin Eating Plan recipes, patients will increase control over what they eat; be able to customize the flavor of foods without the use of added salt, sugar, or fat; and improve the quality of the food they consume. By learning a set of core recipes which are easily assembled, quickly prepared, and affordable, patients are more likely to prepare more healthy foods at home. These workshops focus on convenient breakfasts, simple entres, side dishes, and desserts which can be prepared with minimal effort and are consistent with nutrition recommendations for cardiovascular risk reduction. Cooking [employer] are taught by a Armed forces logistics/support/administrative officer (RD) who has been trained by the [employer]. The chef or RD has a clear understanding of the importance of minimizing - if not completely eliminating - added fat, sugar, and sodium in recipes. Throughout the series of Cooking School Workshop sessions, patients will learn about healthy ingredients and efficient methods of cooking to build confidence in their capability to prepare    Cooking School weekly topics:  Adding Flavor- Sodium-Free  Fast and Healthy Breakfasts  Powerhouse Plant-Based Proteins  Satisfying Salads and Dressings  Simple Sides and Sauces  International Cuisine-Spotlight on the [employer] Zones  Delicious Desserts  Savory Soups  [employer] - Meals in a  Snap  Tasty Appetizers and Snacks  Comforting Weekend Breakfasts  One-Pot Wonders   Fast [employer] Your Pritikin Plate  WORKSHOPS   Healthy Mindset (Psychosocial):  Focused Goals, Sustainable Changes Clinical staff led group instruction and group discussion with PowerPoint presentation and patient guidebook. To enhance the learning environment the use of posters, models and videos may be added. Patients will be able to apply effective goal setting strategies to establish at least one personal goal, and then take  consistent, meaningful action toward that goal. They will learn to identify common barriers to achieving personal goals and develop strategies to overcome them. Patients will also gain an understanding of how our mind-set can impact our ability to achieve goals and the importance of cultivating a positive and growth-oriented mind-set. The purpose of this lesson is to provide patients with a deeper understanding of how to set and achieve personal goals, as well as the tools and strategies needed to overcome common obstacles which may arise along the way.  From Head to Heart: The Power of a Healthy Outlook  Clinical staff led group instruction and group discussion with PowerPoint presentation and patient guidebook. To enhance the learning environment the use of posters, models and videos may be added. Patients will be able to recognize and describe the impact of emotions and mood on physical health. They will discover the importance of self-care and explore self-care practices which may work for them. Patients will also learn how to utilize the 4 C's to cultivate a healthier outlook and better manage stress and challenges. The purpose of this lesson is to demonstrate to patients how a healthy outlook is an essential part of maintaining good health, especially as they continue their cardiac rehab journey.  Healthy Sleep for a Healthy Heart Clinical staff led group instruction and group discussion with PowerPoint presentation and patient guidebook. To enhance the learning environment the use of posters, models and videos may be added. At the conclusion of this workshop, patients will be able to demonstrate knowledge of the importance of sleep to overall health, well-being, and quality of life. They will understand the symptoms of, and treatments for, common sleep disorders. Patients will also be able to identify daytime and nighttime behaviors which impact sleep, and they will be able to apply these tools to help  manage sleep-related challenges. The purpose of this lesson is to provide patients with a general overview of sleep and outline the importance of quality sleep. Patients will learn about a few of the most common sleep disorders. Patients will also be introduced to the concept of "sleep hygiene," and discover ways to self-manage certain sleeping problems through simple daily behavior changes. Finally, the workshop will motivate patients by clarifying the links between quality sleep and their goals of heart-healthy living.   Recognizing and Reducing Stress Clinical staff led group instruction and group discussion with PowerPoint presentation and patient guidebook. To enhance the learning environment the use of posters, models and videos may be added. At the conclusion of this workshop, patients will be able to understand the types of stress reactions, differentiate between acute and chronic stress, and recognize the impact that chronic stress has on their health. They will also be able to apply different coping mechanisms, such as reframing negative self-talk. Patients will have the opportunity to practice a variety of stress management techniques, such as deep abdominal breathing, progressive muscle relaxation, and/or guided imagery.  The purpose of this lesson is to educate patients  on the role of stress in their lives and to provide healthy techniques for coping with it.  Learning Barriers/Preferences:  Learning Barriers/Preferences - 07/20/23 1155       Learning Barriers/Preferences   Learning Barriers None    Learning Preferences Audio;Computer/Internet;Group Instruction;Individual Instruction;Skilled Demonstration;Verbal Instruction;Written Material;Video;Pictoral          Education Topics:  Knowledge Questionnaire Score:  Knowledge Questionnaire Score - 07/20/23 1155       Knowledge Questionnaire Score   Pre Score 19/24          Core Components/Risk Factors/Patient Goals at  Admission:  Personal Goals and Risk Factors at Admission - 07/20/23 1156       Core Components/Risk Factors/Patient Goals on Admission    Weight Management Yes    Intervention Weight Management: Develop a combined nutrition and exercise program designed to reach desired caloric intake, while maintaining appropriate intake of nutrient and fiber, sodium and fats, and appropriate energy expenditure required for the weight goal.;Weight Management: Provide education and appropriate resources to help participant work on and attain dietary goals.    Expected Outcomes Short Term: Continue to assess and modify interventions until short term weight is achieved;Long Term: Adherence to nutrition and physical activity/exercise program aimed toward attainment of established weight goal;Understanding recommendations for meals to include 15-35% energy as protein, 25-35% energy from fat, 35-60% energy from carbohydrates, less than 200mg  of dietary cholesterol, 20-35 gm of total fiber daily;Understanding of distribution of calorie intake throughout the day with the consumption of 4-5 meals/snacks    Hypertension Yes    Intervention Provide education on lifestyle modifcations including regular physical activity/exercise, weight management, moderate sodium restriction and increased consumption of fresh fruit, vegetables, and low fat dairy, alcohol moderation, and smoking cessation.;Monitor prescription use compliance.    Expected Outcomes Short Term: Continued assessment and intervention until BP is < 140/62mm HG in hypertensive participants. < 130/46mm HG in hypertensive participants with diabetes, heart failure or chronic kidney disease.;Long Term: Maintenance of blood pressure at goal levels.    Lipids Yes    Intervention Provide education and support for participant on nutrition & aerobic/resistive exercise along with prescribed medications to achieve LDL 70mg , HDL >40mg .    Expected Outcomes Long Term: Cholesterol  controlled with medications as prescribed, with individualized exercise RX and with personalized nutrition plan. Value goals: LDL < 70mg , HDL > 40 mg.;Short Term: Participant states understanding of desired cholesterol values and is compliant with medications prescribed. Participant is following exercise prescription and nutrition guidelines.    Stress Yes          Core Components/Risk Factors/Patient Goals Review:    Core Components/Risk Factors/Patient Goals at Discharge (Final Review):    ITP Comments:  ITP Comments     Row Name 07/20/23 1130           ITP Comments Dr. Wilbert Bihari medical director. Introduction to pritikin education/intensive cardiac rehab. Initial orientation packet reviewed with patient.          Comments: Participant attended orientation for the cardiac rehabilitation program on  07/20/2023  to perform initial intake and exercise walk test. Patient introduced to the Pritikin Program education and orientation packet was reviewed. Completed 6-minute walk test, measurements, initial ITP, and exercise prescription. Vital signs stable. Telemetry-normal sinus rhythm, asymptomatic.   Service time was from 1029 to 1235.     Con KATHEE Pereyra, MS, ACSM-CEP 07/20/2023 1:49 PM

## 2023-07-20 NOTE — Telephone Encounter (Signed)
 Patient Advocate Encounter   The patient was approved for a [employer] grant that will help cover the cost of REPATHA  Total amount awarded, $2,500.  Effective: 06/20/23 - 06/18/24   APW:[id number] ERW:[id number] Hmnle:[id number] PI:[id number]   Pharmacy provided with approval and processing information. Patient informed via [facility] Ileana Lehmann, CPhT  Pharmacy Patient Advocate Specialist  Direct Number: [phone] Fax: [phone]

## 2023-07-24 ENCOUNTER — Encounter (HOSPITAL_COMMUNITY)
Admission: RE | Admit: 2023-07-24 | Discharge: 2023-07-24 | Disposition: A | Discharge status: Home / Self Care | Source: Ambulatory Visit | Attending: Cardiology

## 2023-07-24 DIAGNOSIS — I214 Non-ST elevation (NSTEMI) myocardial infarction: Secondary | ICD-10-CM

## 2023-07-24 DIAGNOSIS — Z955 Presence of coronary angioplasty implant and graft: Secondary | ICD-10-CM

## 2023-07-24 DIAGNOSIS — M9903 Segmental and somatic dysfunction of lumbar region: Secondary | ICD-10-CM | POA: Diagnosis not present

## 2023-07-24 DIAGNOSIS — M5136 Other intervertebral disc degeneration, lumbar region with discogenic back pain only: Secondary | ICD-10-CM | POA: Diagnosis not present

## 2023-07-24 NOTE — Progress Notes (Signed)
 Daily Session Note  Patient Details  Name: Sharon Marsh MRN: [id number] Date of Birth: July 23, 1945 Referring Provider:   [name] INTENSIVE CARDIAC REHAB ORIENT from 07/20/2023 in [facility]  Referring Provider Shelda Grizzle, MD    Encounter Date: 07/24/2023  Check In:  Session Check In - 07/24/23 1041       Check-In   Supervising physician immediately available to respond to emergencies [facility] MD immediately available    Physician(s) Rosaline Bane NP    Location [facility]    Staff Present Hadassah Quan, RN, Avonne Gal, MS, ACSM-CEP, Exercise Physiologist;David Janann, MS, ACSM-CEP, CCRP, Exercise Physiologist;Jetta Walker BS, ACSM-CEP, Exercise Physiologist;Other    Virtual Visit No    Medication changes reported     No    Fall or balance concerns reported    No    Tobacco Cessation No Change    Warm-up and Cool-down Performed as group-led instruction   CRP2 orientation   Resistance Training Performed Yes    VAD Patient? No    PAD/SET Patient? No      Pain Assessment   Currently in Pain? No/denies    Pain Score 0-No pain    Multiple Pain Sites No          Capillary Blood Glucose: No results found for this or any previous visit (from the past 24 hours).   Exercise Prescription Changes - 07/24/23 1040       Response to Exercise   Blood Pressure (Admit) 128/66    Blood Pressure (Exercise) 130/58    Blood Pressure (Exit) 128/70    Heart Rate (Admit) 92 bpm    Heart Rate (Exercise) 131 bpm    Heart Rate (Exit) 99 bpm    Rating of Perceived Exertion (Exercise) 13    Symptoms None    Comments Off to a good start with exercise.    Duration Continue with 30 min of aerobic exercise without signs/symptoms of physical distress.    Intensity THRR unchanged      Progression   Progression Continue to progress workloads to maintain intensity without signs/symptoms of  physical distress.    Average METs 2.7      Resistance Training   Training Prescription Yes    Weight 2 lbs    Reps 10-15    Time 5 Minutes      Interval Training   Interval Training No      NuStep   Level 5    SPM 63    Minutes 15    METs 2.6      Track   Laps 15    Minutes 15    METs 2.91          Social History   Tobacco Use  Smoking Status Former  Smokeless Tobacco Never    Goals Met:  Exercise tolerated well No report of concerns or symptoms today Strength training completed today  Goals Unmet:  Not Applicable  Comments: Pt started cardiac rehab today.  Pt tolerated light exercise without difficulty. VSS, telemetry-Sinus Rhythm, asymptomatic.  Medication list reconciled. Pt denies barriers to medicaiton compliance.  PSYCHOSOCIAL ASSESSMENT:  PHQ-3. Pt exhibits positive coping skills, hopeful outlook with supportive family. No psychosocial needs identified at this time, no psychosocial interventions necessary.    Pt enjoys line dancing, spending time with her 7 cats, gardening, painting,spending time at the theater and friends.   Pt oriented to exercise equipment and routine.  Understanding verbalized.Hadassah Elpidio Quan RN BSN    Dr. Wilbert Bihari is Medical Director for Cardiac Rehab at [facility].

## 2023-07-26 ENCOUNTER — Ambulatory Visit: Admitting: Pharmacist

## 2023-07-26 ENCOUNTER — Encounter (HOSPITAL_COMMUNITY)
Admission: RE | Admit: 2023-07-26 | Discharge: 2023-07-26 | Disposition: A | Discharge status: Home / Self Care | Source: Ambulatory Visit | Attending: Cardiology | Admitting: Cardiology

## 2023-07-26 ENCOUNTER — Other Ambulatory Visit (HOSPITAL_COMMUNITY): Payer: Self-pay

## 2023-07-26 DIAGNOSIS — Z955 Presence of coronary angioplasty implant and graft: Secondary | ICD-10-CM

## 2023-07-26 DIAGNOSIS — I214 Non-ST elevation (NSTEMI) myocardial infarction: Secondary | ICD-10-CM

## 2023-07-28 ENCOUNTER — Encounter (HOSPITAL_COMMUNITY)
Admission: RE | Admit: 2023-07-28 | Discharge: 2023-07-28 | Disposition: A | Discharge status: Home / Self Care | Source: Ambulatory Visit | Attending: Cardiology | Admitting: Cardiology

## 2023-07-28 DIAGNOSIS — I214 Non-ST elevation (NSTEMI) myocardial infarction: Secondary | ICD-10-CM

## 2023-07-28 DIAGNOSIS — Z955 Presence of coronary angioplasty implant and graft: Secondary | ICD-10-CM

## 2023-07-31 ENCOUNTER — Encounter (HOSPITAL_COMMUNITY)
Admission: RE | Admit: 2023-07-31 | Discharge: 2023-07-31 | Disposition: A | Discharge status: Home / Self Care | Source: Ambulatory Visit | Attending: Cardiology | Admitting: Cardiology

## 2023-07-31 DIAGNOSIS — I214 Non-ST elevation (NSTEMI) myocardial infarction: Secondary | ICD-10-CM | POA: Diagnosis not present

## 2023-07-31 DIAGNOSIS — Z955 Presence of coronary angioplasty implant and graft: Secondary | ICD-10-CM

## 2023-07-31 DIAGNOSIS — C44329 Squamous cell carcinoma of skin of other parts of face: Secondary | ICD-10-CM | POA: Diagnosis not present

## 2023-08-02 ENCOUNTER — Encounter (HOSPITAL_COMMUNITY): Admission: RE | Admit: 2023-08-02 | Source: Ambulatory Visit

## 2023-08-02 ENCOUNTER — Telehealth (HOSPITAL_COMMUNITY): Payer: Self-pay

## 2023-08-02 NOTE — Telephone Encounter (Signed)
 Patient left message at 3:15pm stating she was calling out for her 10:15 CR class this morning and will also be out Friday. She states she had a removal done at her dermatologist and because she is on blood thinners he wants her to refrain from exercise until Monday 8/04 so the incision does not bleed. Patient states she will return to exercise on 8/04.

## 2023-08-04 ENCOUNTER — Encounter (HOSPITAL_COMMUNITY)

## 2023-08-07 ENCOUNTER — Encounter (HOSPITAL_COMMUNITY)
Admission: RE | Admit: 2023-08-07 | Discharge: 2023-08-07 | Disposition: A | Discharge status: Home / Self Care | Source: Ambulatory Visit | Attending: Cardiology | Admitting: Cardiology

## 2023-08-07 DIAGNOSIS — I252 Old myocardial infarction: Secondary | ICD-10-CM | POA: Diagnosis not present

## 2023-08-07 DIAGNOSIS — I214 Non-ST elevation (NSTEMI) myocardial infarction: Secondary | ICD-10-CM

## 2023-08-07 DIAGNOSIS — Z955 Presence of coronary angioplasty implant and graft: Secondary | ICD-10-CM | POA: Insufficient documentation

## 2023-08-07 DIAGNOSIS — Z48812 Encounter for surgical aftercare following surgery on the circulatory system: Secondary | ICD-10-CM | POA: Diagnosis not present

## 2023-08-09 ENCOUNTER — Encounter (HOSPITAL_COMMUNITY)
Admission: RE | Admit: 2023-08-09 | Discharge: 2023-08-09 | Disposition: A | Discharge status: Home / Self Care | Source: Ambulatory Visit | Attending: Cardiology | Admitting: Cardiology

## 2023-08-09 DIAGNOSIS — I252 Old myocardial infarction: Secondary | ICD-10-CM | POA: Diagnosis not present

## 2023-08-09 DIAGNOSIS — Z955 Presence of coronary angioplasty implant and graft: Secondary | ICD-10-CM | POA: Diagnosis not present

## 2023-08-09 DIAGNOSIS — Z48812 Encounter for surgical aftercare following surgery on the circulatory system: Secondary | ICD-10-CM | POA: Diagnosis not present

## 2023-08-09 DIAGNOSIS — I214 Non-ST elevation (NSTEMI) myocardial infarction: Secondary | ICD-10-CM

## 2023-08-11 ENCOUNTER — Encounter (HOSPITAL_COMMUNITY)
Admission: RE | Admit: 2023-08-11 | Discharge: 2023-08-11 | Disposition: A | Discharge status: Home / Self Care | Source: Ambulatory Visit | Attending: Cardiology | Admitting: Cardiology

## 2023-08-11 DIAGNOSIS — I214 Non-ST elevation (NSTEMI) myocardial infarction: Secondary | ICD-10-CM

## 2023-08-11 DIAGNOSIS — Z955 Presence of coronary angioplasty implant and graft: Secondary | ICD-10-CM | POA: Diagnosis not present

## 2023-08-11 DIAGNOSIS — Z48812 Encounter for surgical aftercare following surgery on the circulatory system: Secondary | ICD-10-CM | POA: Diagnosis not present

## 2023-08-11 DIAGNOSIS — I252 Old myocardial infarction: Secondary | ICD-10-CM | POA: Diagnosis not present

## 2023-08-14 ENCOUNTER — Encounter (HOSPITAL_COMMUNITY)
Admission: RE | Admit: 2023-08-14 | Discharge: 2023-08-14 | Disposition: A | Discharge status: Home / Self Care | Source: Ambulatory Visit | Attending: Cardiology

## 2023-08-14 DIAGNOSIS — I214 Non-ST elevation (NSTEMI) myocardial infarction: Secondary | ICD-10-CM

## 2023-08-14 DIAGNOSIS — Z48812 Encounter for surgical aftercare following surgery on the circulatory system: Secondary | ICD-10-CM | POA: Diagnosis not present

## 2023-08-14 DIAGNOSIS — I252 Old myocardial infarction: Secondary | ICD-10-CM | POA: Diagnosis not present

## 2023-08-14 DIAGNOSIS — Z955 Presence of coronary angioplasty implant and graft: Secondary | ICD-10-CM | POA: Diagnosis not present

## 2023-08-15 NOTE — Progress Notes (Signed)
 Cardiac Individual Treatment Plan  Patient Details  Name: Sharon Marsh MRN: [id number] Date of Birth: 1945/06/10 Referring Provider:   Flowsheet Row INTENSIVE CARDIAC REHAB ORIENT from 07/20/2023 in [facility]  Referring Provider Shelda Grizzle, MD    Initial Encounter Date:  Flowsheet Row INTENSIVE CARDIAC REHAB ORIENT from 07/20/2023 in [facility]  Date 07/20/23    Visit Diagnosis: 05/16/23 NSTEMI (non-ST elevated myocardial infarction) (HCC)  06/06/23 DES LAD  Patient's Home Medications on Admission:  Current Outpatient Medications:    ACETYLCYSTEINE PO, Take 1 each by mouth daily. Also known as NAC (pt knows as this) - Take 1 capsules by mouth once daily. May also take extra if needed., Disp: , Rfl:    Ascorbic Acid 1000 MG CHEW, Chew 1,000 mg by mouth daily., Disp: , Rfl:    aspirin  EC 81 MG tablet, Take 1 tablet (81 mg total) by mouth daily. Swallow whole., Disp: 150 tablet, Rfl: 2   b complex vitamins tablet, Take 1 tablet by mouth daily., Disp: , Rfl:    cholecalciferol (VITAMIN D3) 25 MCG (1000 UNIT) tablet, Take 1,000 Units by mouth daily., Disp: , Rfl:    Coenzyme Q10 200 MG capsule, Take by mouth daily., Disp: , Rfl:    Collagen Hydrolysate POWD, 1 Scoop by Does not apply route daily., Disp: , Rfl:    Evolocumab  (REPATHA  SURECLICK) 140 MG/ML SOAJ, Inject 140 mg into the skin every 14 (fourteen) days., Disp: 2 mL, Rfl: 6   MAGNESIUM PO, Take 2 each by mouth at bedtime. Take 2 gummies by mouth every night, Disp: , Rfl:    metoprolol  succinate (TOPROL  XL) 50 MG 24 hr tablet, Take 1 tablet (50 mg total) by mouth daily. Take with or immediately following a meal., Disp: 90 tablet, Rfl: 3   nitroGLYCERIN  (NITROSTAT ) 0.4 MG SL tablet, Place 1 tablet (0.4 mg total) under the tongue every 5 (five) minutes as needed for chest pain., Disp: 25 tablet, Rfl: 3    omeprazole (PRILOSEC) 20 MG capsule, Take 20 mg by mouth daily., Disp: , Rfl:    ticagrelor  (BRILINTA ) 90 MG TABS tablet, Take 1 tablet (90 mg total) by mouth 2 (two) times daily., Disp: 180 tablet, Rfl: 3   zolpidem (AMBIEN) 10 MG tablet, Take 10 mg by mouth at bedtime., Disp: , Rfl:   Past Medical History: 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   Obstructive sleep apnea    Osteoarthritis of left knee 08/09/2017   Osteoarthritis of right knee 10/03/2018   Osteoporosis 02/12/2019   Other and unspecified hyperlipidemia    Personal history of vulvar dysplasia    PONV (postoperative nausea and vomiting)    Unspecified essential hypertension 08/15/2013   Unspecified hereditary and idiopathic peripheral neuropathy 08/15/2013    Tobacco Use: Social History   Tobacco Use  Smoking Status Former  Smokeless Tobacco Never    Labs: Review Flowsheet       Latest Ref Rng & Units 10/29/2019 06/07/2023  Labs for ITP Cardiac and Pulmonary Rehab  Cholestrol 0 - 200 mg/dL -  222   LDL (calc) 0 - 99 mg/dL - 862   HDL-C >59 mg/dL - 48   Trlycerides <849 mg/dL - 813   TCO2 22 - 32 mmol/L 23  -    Capillary Blood Glucose: No results found for: GLUCAP   Exercise Target Goals: Exercise Program Goal: Individual exercise prescription set using results from initial 6 min walk test and THRR while considering  patient's activity barriers and safety.   Exercise Prescription Goal: Initial exercise prescription builds to 30-45 minutes a day of aerobic activity, 2-3 days per week.  Home exercise guidelines will be given to patient during program as part of exercise prescription that the participant will  acknowledge.  Activity Barriers & Risk Stratification:  Activity Barriers & Cardiac Risk Stratification - 07/20/23 1133       Activity Barriers & Cardiac Risk Stratification   Activity Barriers Arthritis;Joint Problems;Assistive Device;Deconditioning;Balance Concerns;History of Falls;Neck/Spine Problems    Cardiac Risk Stratification High   <5 METs on         6 Minute Walk:  6 Minute Walk     Row Name 07/20/23 1342         6 Minute Walk   Phase Initial     Distance 1130 feet     Walk Time 6 minutes     # of Rest Breaks 0     MPH 2.14     METS 2.06     RPE 10.5     Perceived Dyspnea  0     VO2 Peak 7.22     Symptoms No     Resting HR 71 bpm     Resting BP 122/64     Resting Oxygen Saturation  100 %     Exercise Oxygen Saturation  during 6 min walk 99 %     Max Ex. HR 118 bpm     Max Ex. BP 130/66     2 Minute Post BP 114/68        Oxygen Initial Assessment:   Oxygen Re-Evaluation:   Oxygen Discharge (Final Oxygen Re-Evaluation):   Initial Exercise Prescription:  Initial Exercise Prescription - 07/20/23 1100       Date of Initial Exercise RX and Referring Provider   Date 07/20/23    Referring Provider Shelda Grizzle, MD    Expected Discharge Date 10/11/23      NuStep   Level 1    SPM 60    Minutes 15    METs 2      Track   Laps 12    Minutes 15    METs 2      Prescription Details   Frequency (times per week) 3    Duration Progress to 30 minutes of continuous aerobic without signs/symptoms of physical distress      Intensity   THRR 40-80% of Max Heartrate 56-112    Ratings of Perceived Exertion 11-13    Perceived Dyspnea 0-4      Progression   Progression Continue progressive overload as per policy without signs/symptoms or physical distress.      Resistance Training   Training Prescription Yes    Weight 2    Reps 10-15          Perform Capillary Blood Glucose checks as needed.  Exercise Prescription Changes:    Exercise Prescription Changes     Row Name 07/24/23 1040 08/07/23 1041           Response to Exercise   Blood  Pressure (Admit) 128/66 140/68      Blood Pressure (Exercise) 130/58 126/78      Blood Pressure (Exit) 128/70 120/62      Heart Rate (Admit) 92 bpm 87 bpm      Heart Rate (Exercise) 131 bpm 126 bpm      Heart Rate (Exit) 99 bpm 96 bpm      Rating of Perceived Exertion (Exercise) 13 12      Symptoms None None      Comments Off to a good start with exercise. --      Duration Continue with 30 min of aerobic exercise without signs/symptoms of physical distress. Continue with 30 min of aerobic exercise without signs/symptoms of physical distress.      Intensity THRR unchanged THRR unchanged        Progression   Progression Continue to progress workloads to maintain intensity without signs/symptoms of physical distress. Continue to progress workloads to maintain intensity without signs/symptoms of physical distress.      Average METs 2.7 2.7        Resistance Training   Training Prescription Yes Yes      Weight 2 lbs 2 lbs      Reps 10-15 10-15      Time 5 Minutes 5 Minutes        Interval Training   Interval Training No No        NuStep   Level 5 4      SPM 63 76      Minutes 15 15      METs 2.6 2.6        Track   Laps 15 14      Minutes 15 15      METs 2.91 2.79         Exercise Comments:   Exercise Comments     Row Name 07/24/23 1134 08/14/23 1151         Exercise Comments Pat tolerated exercise equipment and stretches without any concerns. Oriented her to the equipment and stretching routine. Reviewed METs with Bruna.         Exercise Goals and Review:   Exercise Goals     Row Name 07/20/23 1133             Exercise Goals   Increase Physical Activity Yes       Intervention Provide advice, education, support and counseling about physical activity/exercise needs.;Develop an individualized exercise prescription for aerobic and resistive training  based on initial evaluation findings, risk stratification, comorbidities and participant's personal goals.       Expected Outcomes Short Term: Attend rehab on a regular basis to increase amount of physical activity.;Long Term: Exercising regularly at least 3-5 days a week.;Long Term: Add in home exercise to make exercise part of routine and to increase amount of physical activity.       Increase Strength and Stamina Yes       Intervention Provide advice, education, support and counseling about physical activity/exercise needs.;Develop an individualized exercise prescription for aerobic and resistive training based on initial evaluation findings, risk stratification, comorbidities and participant's personal goals.       Expected Outcomes Short Term: Increase workloads from initial exercise prescription for resistance, speed, and METs.;Short Term: Perform resistance training exercises routinely during rehab and add in resistance training at home;Long Term: Improve cardiorespiratory fitness, muscular endurance and strength as measured by increased METs and functional capacity ( )       Able to understand  and use rate of perceived exertion (RPE) scale Yes       Intervention Provide education and explanation on how to use RPE scale       Expected Outcomes Short Term: Able to use RPE daily in rehab to express subjective intensity level;Long Term:  Able to use RPE to guide intensity level when exercising independently       Knowledge and understanding of Target Heart Rate Range (THRR) Yes       Intervention Provide education and explanation of THRR including how the numbers were predicted and where they are located for reference       Expected Outcomes Short Term: Able to state/look up THRR;Long Term: Able to use THRR to govern intensity when exercising independently;Short Term: Able to use daily as guideline for intensity in rehab       Understanding of Exercise Prescription Yes       Intervention Provide  education, explanation, and written materials on patient's individual exercise prescription       Expected Outcomes Short Term: Able to explain program exercise prescription;Long Term: Able to explain home exercise prescription to exercise independently          Exercise Goals Re-Evaluation :  Exercise Goals Re-Evaluation     Row Name 07/24/23 1134             Exercise Goal Re-Evaluation   Exercise Goals Review Increase Physical Activity;Increase Strength and Stamina;Able to understand and use rate of perceived exertion (RPE) scale       Comments Bruna was able to understand and use RPE scale appropriately.       Expected Outcomes Progress workloads as tolerated to help improve cardiorespiratory fitness.          Discharge Exercise Prescription (Final Exercise Prescription Changes):  Exercise Prescription Changes - 08/07/23 1041       Response to Exercise   Blood Pressure (Admit) 140/68    Blood Pressure (Exercise) 126/78    Blood Pressure (Exit) 120/62    Heart Rate (Admit) 87 bpm    Heart Rate (Exercise) 126 bpm    Heart Rate (Exit) 96 bpm    Rating of Perceived Exertion (Exercise) 12    Symptoms None    Duration Continue with 30 min of aerobic exercise without signs/symptoms of physical distress.    Intensity THRR unchanged      Progression   Progression Continue to progress workloads to maintain intensity without signs/symptoms of physical distress.    Average METs 2.7      Resistance Training   Training Prescription Yes    Weight 2 lbs    Reps 10-15    Time 5 Minutes      Interval Training   Interval Training No      NuStep   Level 4    SPM 76    Minutes 15    METs 2.6      Track   Laps 14    Minutes 15    METs 2.79          Nutrition:  Target Goals: Understanding of nutrition guidelines, daily intake of sodium 1500mg , cholesterol 200mg , calories 30% from fat and 7% or less from saturated fats, daily to have 5 or more servings of fruits and  vegetables.  Biometrics:  Pre Biometrics - 07/20/23 1132       Pre Biometrics   Waist Circumference 35.5 inches    Hip Circumference 43 inches    Waist to Hip Ratio 0.83 %  Triceps Skinfold 26 mm    % Body Fat 41.1 %    Grip Strength 20 kg    Flexibility --   back pain- not done   Single Leg Stand 3.62 seconds           Nutrition Therapy Plan and Nutrition Goals:  Nutrition Therapy & Goals - 07/24/23 1133       Nutrition Therapy   Diet Heart Healthy diet    Drug/Food Interactions --      Personal Nutrition Goals   Nutrition Goal Patient to identify strategies for reducing cardiovascular risk by attending the Pritikin education and nutrition series weekly.    Personal Goal #2 Patient to improve diet quality by using the plate method as a guide for meal planning to include lean protein/plant protein, fruits, vegetables, whole grains, nonfat dairy as part of a well-balanced diet.    Comments Bruna has medical history of  CAD, NSTEMI, s/p DES-LAD, familial hyperlipidemia, HTN, OSA. LDL is not at goal of <55; was started on repatha  on 6/17. Patient will benefit from participation in intensive cardiac rehab for nutrition education, exercise, and lifestyle modification.      Intervention Plan   Intervention Prescribe, educate and counsel regarding individualized specific dietary modifications aiming towards targeted core components such as weight, hypertension, lipid management, diabetes, heart failure and other comorbidities.;Nutrition handout(s) given to patient.    Expected Outcomes Short Term Goal: Understand basic principles of dietary content, such as calories, fat, sodium, cholesterol and nutrients.;Long Term Goal: Adherence to prescribed nutrition plan.          Nutrition Assessments:  Nutrition Assessments - 07/24/23 1127       Rate Your Plate Scores   Pre Score 63         MEDIFICTS Score Key: >=70 Need to make dietary changes  40-70 Heart Healthy Diet <= 40  Therapeutic Level Cholesterol Diet   Flowsheet Row INTENSIVE CARDIAC REHAB from 07/24/2023 in [facility]  Picture Your Plate Total Score on Admission 63   Picture Your Plate Scores: <59 Unhealthy dietary pattern with much room for improvement. 41-50 Dietary pattern unlikely to meet recommendations for good health and room for improvement. 51-60 More healthful dietary pattern, with some room for improvement.  >60 Healthy dietary pattern, although there may be some specific behaviors that could be improved.    Nutrition Goals Re-Evaluation:  Nutrition Goals Re-Evaluation     Row Name 07/24/23 1133             Goals   Current Weight 156 lb 4.9 oz (70.9 kg)       Comment Lpa 230, cholesterol 222, triglycerides 186, LDL 137, Cr 1.03, GFR 55,       Expected Outcome Pat has medical history of CAD, NSTEMI, s/p DES-LAD, familial hyperlipidemia, HTN, OSA. LDL is not at goal of <55; was started on repatha  on 6/17. Patient will benefit from participation in intensive cardiac rehab for nutrition education, exercise, and lifestyle modification.          Nutrition Goals Re-Evaluation:  Nutrition Goals Re-Evaluation     Row Name 07/24/23 1133             Goals   Current Weight 156 lb 4.9 oz (70.9 kg)       Comment Lpa 230, cholesterol 222, triglycerides 186, LDL 137, Cr 1.03, GFR 55,       Expected Outcome Pat has medical history of  CAD, NSTEMI, s/p DES-LAD, familial hyperlipidemia, HTN, OSA. LDL is not at goal of <55; was started on repatha  on 6/17. Patient will benefit from participation in intensive cardiac rehab for nutrition education, exercise, and lifestyle modification.          Nutrition Goals Discharge (Final Nutrition Goals Re-Evaluation):  Nutrition Goals Re-Evaluation - 07/24/23 1133       Goals   Current Weight 156 lb 4.9 oz (70.9 kg)    Comment Lpa 230, cholesterol 222, triglycerides 186, LDL 137, Cr 1.03, GFR  55,    Expected Outcome Pat has medical history of CAD, NSTEMI, s/p DES-LAD, familial hyperlipidemia, HTN, OSA. LDL is not at goal of <55; was started on repatha  on 6/17. Patient will benefit from participation in intensive cardiac rehab for nutrition education, exercise, and lifestyle modification.          Psychosocial: Target Goals: Acknowledge presence or absence of significant depression and/or stress, maximize coping skills, provide positive support system. Participant is able to verbalize types and ability to use techniques and skills needed for reducing stress and depression.  Initial Review & Psychosocial Screening:  Initial Psych Review & Screening - 07/20/23 1155       Initial Review   Current issues with None Identified      Family Dynamics   Good Support System? Yes   family and friends     Barriers   Psychosocial barriers to participate in program There are no identifiable barriers or psychosocial needs.      Screening Interventions   Interventions Encouraged to exercise;Provide feedback about the scores to participant    Expected Outcomes Long Term goal: The participant improves quality of Life and PHQ9 Scores as seen by post scores and/or verbalization of changes;Short Term goal: Identification and review with participant of any Quality of Life or Depression concerns found by scoring the questionnaire.          Quality of Life Scores:  Quality of Life - 07/20/23 1343       Quality of Life   Select Quality of Life      Quality of Life Scores   Health/Function Pre 21.37 %    Socioeconomic Pre 23 %    Psych/Spiritual Pre 24.64 %    Family Pre 22.5 %    GLOBAL Pre 22.55 %         Scores of 19 and below usually indicate a poorer quality of life in these areas.  A difference of  2-3 points is a clinically meaningful difference.  A difference of 2-3 points in the total score of the Quality of Life Index has been associated with significant improvement in  overall quality of life, self-image, physical symptoms, and general health in studies assessing change in quality of life.  PHQ-9: Review Flowsheet       07/20/2023  Depression screen PHQ 2/9  Decreased Interest 0  Down, Depressed, Hopeless 0  PHQ - 2 Score 0  Altered sleeping 3  Tired, decreased energy 0  Change in appetite 0  Feeling bad or failure about yourself  0  Trouble concentrating 0  Moving slowly or fidgety/restless 0  Suicidal thoughts 0  PHQ-9 Score 3  Difficult doing work/chores Somewhat difficult   Interpretation of Total Score  Total Score Depression Severity:  1-4 = Minimal depression, 5-9 = Mild depression, 10-14 = Moderate depression, 15-19 = Moderately severe depression, 20-27 = Severe depression   Psychosocial Evaluation and Intervention:   Psychosocial Re-Evaluation:  Psychosocial  Re-Evaluation     Row Name 07/25/23 0727 08/15/23 1503           Psychosocial Re-Evaluation   Current issues with Current Sleep Concerns Current Sleep Concerns      Comments Will discuss sleep hygiene in the upcoming week Discussed sleep hygiene. Bruna says that she has difficulty sleeping at night despite taking sleeping pills.      Interventions Encouraged to attend Cardiac Rehabilitation for the exercise Encouraged to attend Cardiac Rehabilitation for the exercise;Stress management education;Relaxation education      Continue Psychosocial Services  Follow up required by staff Follow up required by staff         Psychosocial Discharge (Final Psychosocial Re-Evaluation):  Psychosocial Re-Evaluation - 08/15/23 1503       Psychosocial Re-Evaluation   Current issues with Current Sleep Concerns    Comments Discussed sleep hygiene. Bruna says that she has difficulty sleeping at night despite taking sleeping pills.    Interventions Encouraged to attend Cardiac Rehabilitation for the exercise;Stress management education;Relaxation education    Continue Psychosocial Services   Follow up required by staff          Vocational Rehabilitation: Provide vocational rehab assistance to qualifying candidates.   Vocational Rehab Evaluation & Intervention:  Vocational Rehab - 07/20/23 1155       Initial Vocational Rehab Evaluation & Intervention   Assessment shows need for Vocational Rehabilitation No   retired         Education: Education Goals: Education classes will be provided on a weekly basis, covering required topics. Participant will state understanding/return demonstration of topics presented.    Education     Row Name 07/26/23 1100     Education   Cardiac Education Topics Pritikin   Orthoptist   Educator Dietitian   Weekly Topic Tasty Appetizers and Snacks   Instruction Review Code 1- Verbalizes Understanding   Class Start Time 1145   Class Stop Time 1225   Class Time Calculation (min) 40 min    Row Name 07/28/23 1100     Education   Cardiac Education Topics Pritikin   Hospital doctor Education   General Education Heart Disease Risk Reduction   Instruction Review Code 1- Verbalizes Understanding   Class Start Time 1150   Class Stop Time 1240   Class Time Calculation (min) 50 min    Row Name 07/31/23 1300     Education   Cardiac Education Topics Pritikin   Nurse, children's Exercise Physiologist   Select Psychosocial   Psychosocial Healthy Minds, Bodies, Hearts   Instruction Review Code 1- Verbalizes Understanding   Class Start Time 1157   Class Stop Time 1233   Class Time Calculation (min) 36 min    Row Name 08/07/23 1500     Education   Cardiac Education Topics Pritikin   Glass blower/designer Nutrition   Nutrition Workshop Label Reading   Instruction Review Code 1- Verbalizes Understanding   Class Start Time 1145   Class Stop Time 1230   Class  Time Calculation (min) 45 min    Row Name 08/09/23 1000     Education   Cardiac Education Topics Pritikin   [employer]  Educator Dietitian   Weekly Topic Fast and Healthy Breakfasts   Instruction Review Code 1- Verbalizes Understanding   Class Start Time 1145   Class Stop Time 1220   Class Time Calculation (min) 35 min    Row Name 08/11/23 1000     Education   Cardiac Education Topics Pritikin   Select Core Videos     Core Videos   Educator Dietitian   Select Nutrition   Nutrition Other   Instruction Review Code 1- Verbalizes Understanding   Class Start Time 1145   Class Stop Time 1225   Class Time Calculation (min) 40 min    Row Name 08/14/23 1100     Education   Cardiac Education Topics Pritikin   Hospital doctor Education   General Education Metabolic Syndrome and Belly Fat   Instruction Review Code 1- Verbalizes Understanding   Class Start Time 1200   Class Stop Time 1235   Class Time Calculation (min) 35 min      Core Videos: Exercise    Move It!  Clinical staff conducted group or individual video education with verbal and written material and guidebook.  Patient learns the recommended Pritikin exercise program. Exercise with the goal of living a long, healthy life. Some of the health benefits of exercise include controlled diabetes, healthier blood pressure levels, improved cholesterol levels, improved heart and lung capacity, improved sleep, and better body composition. Everyone should speak with their doctor before starting or changing an exercise routine.  Biomechanical Limitations Clinical staff conducted group or individual video education with verbal and written material and guidebook.  Patient learns how biomechanical limitations can impact exercise and how we can mitigate and possibly overcome limitations to have an impactful and balanced exercise  routine.  Body Composition Clinical staff conducted group or individual video education with verbal and written material and guidebook.  Patient learns that body composition (ratio of muscle mass to fat mass) is a key component to assessing overall fitness, rather than body weight alone. Increased fat mass, especially visceral belly fat, can put us  at increased risk for metabolic syndrome, type 2 diabetes, heart disease, and even death. It is recommended to combine diet and exercise (cardiovascular and resistance training) to improve your body composition. Seek guidance from your physician and exercise physiologist before implementing an exercise routine.  Exercise Action Plan Clinical staff conducted group or individual video education with verbal and written material and guidebook.  Patient learns the recommended strategies to achieve and enjoy long-term exercise adherence, including variety, self-motivation, self-efficacy, and positive decision making. Benefits of exercise include fitness, good health, weight management, more energy, better sleep, less stress, and overall well-being.  Medical   Heart Disease Risk Reduction Clinical staff conducted group or individual video education with verbal and written material and guidebook.  Patient learns our heart is our most vital organ as it circulates oxygen, nutrients, white blood cells, and hormones throughout the entire body, and carries waste away. Data supports a plant-based eating plan like the Pritikin Program for its effectiveness in slowing progression of and reversing heart disease. The video provides a number of recommendations to address heart disease.   Metabolic Syndrome and Belly Fat  Clinical staff conducted group or individual video education with verbal and written material and guidebook.  Patient learns what metabolic syndrome is, how it leads to heart disease, and how one can reverse it and keep it  from coming back. You have  metabolic syndrome if you have 3 of the following 5 criteria: abdominal obesity, high blood pressure, high triglycerides, low HDL cholesterol, and high blood sugar.  Hypertension and Heart Disease Clinical staff conducted group or individual video education with verbal and written material and guidebook.  Patient learns that high blood pressure, or hypertension, is very common in the [place] . Hypertension is largely due to excessive salt intake, but other important risk factors include being overweight, physical inactivity, drinking too much alcohol, smoking, and not eating enough potassium from fruits and vegetables. High blood pressure is a leading risk factor for heart attack, stroke, congestive heart failure, dementia, kidney failure, and premature death. Long-term effects of excessive salt intake include stiffening of the arteries and thickening of heart muscle and organ damage. Recommendations include ways to reduce hypertension and the risk of heart disease.  Diseases of Our Time - Focusing on Diabetes Clinical staff conducted group or individual video education with verbal and written material and guidebook.  Patient learns why the best way to stop diseases of our time is prevention, through food and other lifestyle changes. Medicine (such as prescription pills and surgeries) is often only a Band-Aid on the problem, not a long-term solution. Most common diseases of our time include obesity, type 2 diabetes, hypertension, heart disease, and cancer. The Pritikin Program is recommended and has been proven to help reduce, reverse, and/or prevent the damaging effects of metabolic syndrome.  Nutrition   Overview of the Pritikin Eating Plan  Clinical staff conducted group or individual video education with verbal and written material and guidebook.  Patient learns about the Pritikin Eating Plan for disease risk reduction. The Pritikin Eating Plan emphasizes a wide variety of unrefined,  minimally-processed carbohydrates, like fruits, vegetables, whole grains, and legumes. Go, Caution, and Stop food choices are explained. Plant-based and lean animal proteins are emphasized. Rationale provided for low sodium intake for blood pressure control, low added sugars for blood sugar stabilization, and low added fats and oils for coronary artery disease risk reduction and weight management.  Calorie Density  Clinical staff conducted group or individual video education with verbal and written material and guidebook.  Patient learns about calorie density and how it impacts the Pritikin Eating Plan. Knowing the characteristics of the food you choose will help you decide whether those foods will lead to weight gain or weight loss, and whether you want to consume more or less of them. Weight loss is usually a side effect of the Pritikin Eating Plan because of its focus on low calorie-dense foods.  Label Reading  Clinical staff conducted group or individual video education with verbal and written material and guidebook.  Patient learns about the Pritikin recommended label reading guidelines and corresponding recommendations regarding calorie density, added sugars, sodium content, and whole grains.  Dining Out - Part 1  Clinical staff conducted group or individual video education with verbal and written material and guidebook.  Patient learns that restaurant meals can be sabotaging because they can be so high in calories, fat, sodium, and/or sugar. Patient learns recommended strategies on how to positively address this and avoid unhealthy pitfalls.  Facts on Fats  Clinical staff conducted group or individual video education with verbal and written material and guidebook.  Patient learns that lifestyle modifications can be just as effective, if not more so, as many medications for lowering your risk of heart disease. A Pritikin lifestyle can help to reduce your risk of inflammation and  atherosclerosis  (cholesterol build-up, or plaque, in the artery walls). Lifestyle interventions such as dietary choices and physical activity address the cause of atherosclerosis. A review of the types of fats and their impact on blood cholesterol levels, along with dietary recommendations to reduce fat intake is also included.  Nutrition Action Plan  Clinical staff conducted group or individual video education with verbal and written material and guidebook.  Patient learns how to incorporate Pritikin recommendations into their lifestyle. Recommendations include planning and keeping personal health goals in mind as an important part of their success.  Healthy Mind-Set    Healthy Minds, Bodies, Hearts  Clinical staff conducted group or individual video education with verbal and written material and guidebook.  Patient learns how to identify when they are stressed. Video will discuss the impact of that stress, as well as the many benefits of stress management. Patient will also be introduced to stress management techniques. The way we think, act, and feel has an impact on our hearts.  How Our Thoughts Can Heal Our Hearts  Clinical staff conducted group or individual video education with verbal and written material and guidebook.  Patient learns that negative thoughts can cause depression and anxiety. This can result in negative lifestyle behavior and serious health problems. Cognitive behavioral therapy is an effective method to help control our thoughts in order to change and improve our emotional outlook.  Additional Videos:  Exercise    Improving Performance  Clinical staff conducted group or individual video education with verbal and written material and guidebook.  Patient learns to use a non-linear approach by alternating intensity levels and lengths of time spent exercising to help burn more calories and lose more body fat. Cardiovascular exercise helps improve heart health, metabolism, hormonal balance,  blood sugar control, and recovery from fatigue. Resistance training improves strength, endurance, balance, coordination, reaction time, metabolism, and muscle mass. Flexibility exercise improves circulation, posture, and balance. Seek guidance from your physician and exercise physiologist before implementing an exercise routine and learn your capabilities and proper form for all exercise.  Introduction to Yoga  Clinical staff conducted group or individual video education with verbal and written material and guidebook.  Patient learns about yoga, a discipline of the coming together of mind, breath, and body. The benefits of yoga include improved flexibility, improved range of motion, better posture and core strength, increased lung function, weight loss, and positive self-image. Yoga's heart health benefits include lowered blood pressure, healthier heart rate, decreased cholesterol and triglyceride levels, improved immune function, and reduced stress. Seek guidance from your physician and exercise physiologist before implementing an exercise routine and learn your capabilities and proper form for all exercise.  Medical   Aging: Enhancing Your Quality of Life  Clinical staff conducted group or individual video education with verbal and written material and guidebook.  Patient learns key strategies and recommendations to stay in good physical health and enhance quality of life, such as prevention strategies, having an advocate, securing a Health Care Proxy and Power of Attorney, and keeping a list of medications and system for tracking them. It also discusses how to avoid risk for bone loss.  Biology of Weight Control  Clinical staff conducted group or individual video education with verbal and written material and guidebook.  Patient learns that weight gain occurs because we consume more calories than we burn (eating more, moving less). Even if your body weight is normal, you may have higher ratios of fat  compared to muscle mass. Too much  body fat puts you at increased risk for cardiovascular disease, heart attack, stroke, type 2 diabetes, and obesity-related cancers. In addition to exercise, following the Pritikin Eating Plan can help reduce your risk.  Decoding Lab Results  Clinical staff conducted group or individual video education with verbal and written material and guidebook.  Patient learns that lab test reflects one measurement whose values change over time and are influenced by many factors, including medication, stress, sleep, exercise, food, hydration, pre-existing medical conditions, and more. It is recommended to use the knowledge from this video to become more involved with your lab results and evaluate your numbers to speak with your doctor.   Diseases of Our Time - Overview  Clinical staff conducted group or individual video education with verbal and written material and guidebook.  Patient learns that according to the CDC, 50% to 70% of chronic diseases (such as obesity, type 2 diabetes, elevated lipids, hypertension, and heart disease) are avoidable through lifestyle improvements including healthier food choices, listening to satiety cues, and increased physical activity.  Sleep Disorders Clinical staff conducted group or individual video education with verbal and written material and guidebook.  Patient learns how good quality and duration of sleep are important to overall health and well-being. Patient also learns about sleep disorders and how they impact health along with recommendations to address them, including discussing with a physician.  Nutrition  Dining Out - Part 2 Clinical staff conducted group or individual video education with verbal and written material and guidebook.  Patient learns how to plan ahead and communicate in order to maximize their dining experience in a healthy and nutritious manner. Included are recommended food choices based on the type of restaurant  the patient is visiting.   Fueling a Banker conducted group or individual video education with verbal and written material and guidebook.  There is a strong connection between our food choices and our health. Diseases like obesity and type 2 diabetes are very prevalent and are in large-part due to lifestyle choices. The Pritikin Eating Plan provides plenty of food and hunger-curbing satisfaction. It is easy to follow, affordable, and helps reduce health risks.  Menu Workshop  Clinical staff conducted group or individual video education with verbal and written material and guidebook.  Patient learns that restaurant meals can sabotage health goals because they are often packed with calories, fat, sodium, and sugar. Recommendations include strategies to plan ahead and to communicate with the manager, chef, or server to help order a healthier meal.  Planning Your Eating Strategy  Clinical staff conducted group or individual video education with verbal and written material and guidebook.  Patient learns about the Pritikin Eating Plan and its benefit of reducing the risk of disease. The Pritikin Eating Plan does not focus on calories. Instead, it emphasizes high-quality, nutrient-rich foods. By knowing the characteristics of the foods, we choose, we can determine their calorie density and make informed decisions.  Targeting Your Nutrition Priorities  Clinical staff conducted group or individual video education with verbal and written material and guidebook.  Patient learns that lifestyle habits have a tremendous impact on disease risk and progression. This video provides eating and physical activity recommendations based on your personal health goals, such as reducing LDL cholesterol, losing weight, preventing or controlling type 2 diabetes, and reducing high blood pressure.  Vitamins and Minerals  Clinical staff conducted group or individual video education with verbal and written  material and guidebook.  Patient learns different ways to  obtain key vitamins and minerals, including through a recommended healthy diet. It is important to discuss all supplements you take with your doctor.   Healthy Mind-Set    Smoking Cessation  Clinical staff conducted group or individual video education with verbal and written material and guidebook.  Patient learns that cigarette smoking and tobacco addiction pose a serious health risk which affects millions of people. Stopping smoking will significantly reduce the risk of heart disease, lung disease, and many forms of cancer. Recommended strategies for quitting are covered, including working with your doctor to develop a successful plan.  Culinary   Becoming a Set designer conducted group or individual video education with verbal and written material and guidebook.  Patient learns that cooking at home can be healthy, cost-effective, quick, and puts them in control. Keys to cooking healthy recipes will include looking at your recipe, assessing your equipment needs, planning ahead, making it simple, choosing cost-effective seasonal ingredients, and limiting the use of added fats, salts, and sugars.  Cooking - Breakfast and Snacks  Clinical staff conducted group or individual video education with verbal and written material and guidebook.  Patient learns how important breakfast is to satiety and nutrition through the entire day. Recommendations include key foods to eat during breakfast to help stabilize blood sugar levels and to prevent overeating at meals later in the day. Planning ahead is also a key component.  Cooking - Educational psychologist conducted group or individual video education with verbal and written material and guidebook.  Patient learns eating strategies to improve overall health, including an approach to cook more at home. Recommendations include thinking of animal protein as a side on your plate  rather than center stage and focusing instead on lower calorie dense options like vegetables, fruits, whole grains, and plant-based proteins, such as beans. Making sauces in large quantities to freeze for later and leaving the skin on your vegetables are also recommended to maximize your experience.  Cooking - Healthy Salads and Dressing Clinical staff conducted group or individual video education with verbal and written material and guidebook.  Patient learns that vegetables, fruits, whole grains, and legumes are the foundations of the Pritikin Eating Plan. Recommendations include how to incorporate each of these in flavorful and healthy salads, and how to create homemade salad dressings. Proper handling of ingredients is also covered. Cooking - Soups and [employer] - Soups and Desserts Clinical staff conducted group or individual video education with verbal and written material and guidebook.  Patient learns that Pritikin soups and desserts make for easy, nutritious, and delicious snacks and meal components that are low in sodium, fat, sugar, and calorie density, while high in vitamins, minerals, and filling fiber. Recommendations include simple and healthy ideas for soups and desserts.   Overview     The Pritikin Solution Program Overview Clinical staff conducted group or individual video education with verbal and written material and guidebook.  Patient learns that the results of the Pritikin Program have been documented in more than 100 articles published in peer-reviewed journals, and the benefits include reducing risk factors for (and, in some cases, even reversing) high cholesterol, high blood pressure, type 2 diabetes, obesity, and more! An overview of the three key pillars of the Pritikin Program will be covered: eating well, doing regular exercise, and having a healthy mind-set.  WORKSHOPS  Exercise: Exercise Basics: Building Your Action Plan Clinical staff led group instruction  and group discussion with PowerPoint presentation  and patient guidebook. To enhance the learning environment the use of posters, models and videos may be added. At the conclusion of this workshop, patients will comprehend the difference between physical activity and exercise, as well as the benefits of incorporating both, into their routine. Patients will understand the FITT (Frequency, Intensity, Time, and Type) principle and how to use it to build an exercise action plan. In addition, safety concerns and other considerations for exercise and cardiac rehab will be addressed by the presenter. The purpose of this lesson is to promote a comprehensive and effective weekly exercise routine in order to improve patients' overall level of fitness.   Managing Heart Disease: Your Path to a Healthier Heart Clinical staff led group instruction and group discussion with PowerPoint presentation and patient guidebook. To enhance the learning environment the use of posters, models and videos may be added.At the conclusion of this workshop, patients will understand the anatomy and physiology of the heart. Additionally, they will understand how Pritikin's three pillars impact the risk factors, the progression, and the management of heart disease.  The purpose of this lesson is to provide a high-level overview of the heart, heart disease, and how the Pritikin lifestyle positively impacts risk factors.  Exercise Biomechanics Clinical staff led group instruction and group discussion with PowerPoint presentation and patient guidebook. To enhance the learning environment the use of posters, models and videos may be added. Patients will learn how the structural parts of their bodies function and how these functions impact their daily activities, movement, and exercise. Patients will learn how to promote a neutral spine, learn how to manage pain, and identify ways to improve their physical movement in order to  promote healthy living. The purpose of this lesson is to expose patients to common physical limitations that impact physical activity. Participants will learn practical ways to adapt and manage aches and pains, and to minimize their effect on regular exercise. Patients will learn how to maintain good posture while sitting, walking, and lifting.  Balance Training and Fall Prevention  Clinical staff led group instruction and group discussion with PowerPoint presentation and patient guidebook. To enhance the learning environment the use of posters, models and videos may be added. At the conclusion of this workshop, patients will understand the importance of their sensorimotor skills (vision, proprioception, and the vestibular system) in maintaining their ability to balance as they age. Patients will apply a variety of balancing exercises that are appropriate for their current level of function. Patients will understand the common causes for poor balance, possible solutions to these problems, and ways to modify their physical environment in order to minimize their fall risk. The purpose of this lesson is to teach patients about the importance of maintaining balance as they age and ways to minimize their risk of falling.  WORKSHOPS   Nutrition:  Fueling a Ship broker led group instruction and group discussion with PowerPoint presentation and patient guidebook. To enhance the learning environment the use of posters, models and videos may be added. Patients will review the foundational principles of the Pritikin Eating Plan and understand what constitutes a serving size in each of the food groups. Patients will also learn Pritikin-friendly foods that are better choices when away from home and review make-ahead meal and snack options. Calorie density will be reviewed and applied to three nutrition priorities: weight maintenance, weight loss, and weight gain. The purpose of this lesson is to  reinforce (in a group setting) the key concepts around what  patients are recommended to eat and how to apply these guidelines when away from home by planning and selecting Pritikin-friendly options. Patients will understand how calorie density may be adjusted for different weight management goals.  Mindful Eating  Clinical staff led group instruction and group discussion with PowerPoint presentation and patient guidebook. To enhance the learning environment the use of posters, models and videos may be added. Patients will briefly review the concepts of the Pritikin Eating Plan and the importance of low-calorie dense foods. The concept of mindful eating will be introduced as well as the importance of paying attention to internal hunger signals. Triggers for non-hunger eating and techniques for dealing with triggers will be explored. The purpose of this lesson is to provide patients with the opportunity to review the basic principles of the Pritikin Eating Plan, discuss the value of eating mindfully and how to measure internal cues of hunger and fullness using the Hunger Scale. Patients will also discuss reasons for non-hunger eating and learn strategies to use for controlling emotional eating.  Targeting Your Nutrition Priorities Clinical staff led group instruction and group discussion with PowerPoint presentation and patient guidebook. To enhance the learning environment the use of posters, models and videos may be added. Patients will learn how to determine their genetic susceptibility to disease by reviewing their family history. Patients will gain insight into the importance of diet as part of an overall healthy lifestyle in mitigating the impact of genetics and other environmental insults. The purpose of this lesson is to provide patients with the opportunity to assess their personal nutrition priorities by looking at their family history, their own health history and current risk factors. Patients will  also be able to discuss ways of prioritizing and modifying the Pritikin Eating Plan for their highest risk areas  Menu  Clinical staff led group instruction and group discussion with PowerPoint presentation and patient guidebook. To enhance the learning environment the use of posters, models and videos may be added. Using menus brought in from [employer], or printed from [employer], patients will apply the Pritikin dining out guidelines that were presented in the [employer] video. Patients will also be able to practice these guidelines in a variety of provided scenarios. The purpose of this lesson is to provide patients with the opportunity to practice hands-on learning of the Pritikin Dining Out guidelines with actual menus and practice scenarios.  Label Reading Clinical staff led group instruction and group discussion with PowerPoint presentation and patient guidebook. To enhance the learning environment the use of posters, models and videos may be added. Patients will review and discuss the Pritikin label reading guidelines presented in Pritikin's Label Reading Educational series video. Using fool labels brought in from local grocery stores and markets, patients will apply the label reading guidelines and determine if the packaged food meet the Pritikin guidelines. The purpose of this lesson is to provide patients with the opportunity to review, discuss, and practice hands-on learning of the Pritikin Label Reading guidelines with actual packaged food labels. Cooking School  Pritikin's [employer] are designed to teach patients ways to prepare quick, simple, and affordable recipes at home. The importance of nutrition's role in chronic disease risk reduction is reflected in its emphasis in the overall Pritikin program. By learning how to prepare essential core Pritikin Eating Plan recipes, patients will increase control over what they eat; be able to customize the  flavor of foods without the use of added salt, sugar, or fat;  and improve the quality of the food they consume. By learning a set of core recipes which are easily assembled, quickly prepared, and affordable, patients are more likely to prepare more healthy foods at home. These workshops focus on convenient breakfasts, simple entres, side dishes, and desserts which can be prepared with minimal effort and are consistent with nutrition recommendations for cardiovascular risk reduction. Cooking [employer] are taught by a Armed forces logistics/support/administrative officer (RD) who has been trained by the [employer]. The chef or RD has a clear understanding of the importance of minimizing - if not completely eliminating - added fat, sugar, and sodium in recipes. Throughout the series of Cooking School Workshop sessions, patients will learn about healthy ingredients and efficient methods of cooking to build confidence in their capability to prepare    Cooking School weekly topics:  Adding Flavor- Sodium-Free  Fast and Healthy Breakfasts  Powerhouse Plant-Based Proteins  Satisfying Salads and Dressings  Simple Sides and Sauces  International Cuisine-Spotlight on the [employer] Zones  Delicious Desserts  Savory Soups  [employer] - Meals in a Astronomer Appetizers and Snacks  Comforting Weekend Breakfasts  One-Pot Wonders   Fast Evening Meals  Landscape architect Your Pritikin Plate  WORKSHOPS   Healthy Mindset (Psychosocial):  Focused Goals, Sustainable Changes Clinical staff led group instruction and group discussion with PowerPoint presentation and patient guidebook. To enhance the learning environment the use of posters, models and videos may be added. Patients will be able to apply effective goal setting strategies to establish at least one personal goal, and then take consistent, meaningful action toward that goal. They will learn to identify common barriers to achieving  personal goals and develop strategies to overcome them. Patients will also gain an understanding of how our mind-set can impact our ability to achieve goals and the importance of cultivating a positive and growth-oriented mind-set. The purpose of this lesson is to provide patients with a deeper understanding of how to set and achieve personal goals, as well as the tools and strategies needed to overcome common obstacles which may arise along the way.  From Head to Heart: The Power of a Healthy Outlook  Clinical staff led group instruction and group discussion with PowerPoint presentation and patient guidebook. To enhance the learning environment the use of posters, models and videos may be added. Patients will be able to recognize and describe the impact of emotions and mood on physical health. They will discover the importance of self-care and explore self-care practices which may work for them. Patients will also learn how to utilize the 4 C's to cultivate a healthier outlook and better manage stress and challenges. The purpose of this lesson is to demonstrate to patients how a healthy outlook is an essential part of maintaining good health, especially as they continue their cardiac rehab journey.  Healthy Sleep for a Healthy Heart Clinical staff led group instruction and group discussion with PowerPoint presentation and patient guidebook. To enhance the learning environment the use of posters, models and videos may be added. At the conclusion of this workshop, patients will be able to demonstrate knowledge of the importance of sleep to overall health, well-being, and quality of life. They will understand the symptoms of, and treatments for, common sleep disorders. Patients will also be able to identify daytime and nighttime behaviors which impact sleep, and they will be able to apply these tools to help manage sleep-related challenges. The purpose of this lesson is to  provide patients with a general  overview of sleep and outline the importance of quality sleep. Patients will learn about a few of the most common sleep disorders. Patients will also be introduced to the concept of "sleep hygiene," and discover ways to self-manage certain sleeping problems through simple daily behavior changes. Finally, the workshop will motivate patients by clarifying the links between quality sleep and their goals of heart-healthy living.   Recognizing and Reducing Stress Clinical staff led group instruction and group discussion with PowerPoint presentation and patient guidebook. To enhance the learning environment the use of posters, models and videos may be added. At the conclusion of this workshop, patients will be able to understand the types of stress reactions, differentiate between acute and chronic stress, and recognize the impact that chronic stress has on their health. They will also be able to apply different coping mechanisms, such as reframing negative self-talk. Patients will have the opportunity to practice a variety of stress management techniques, such as deep abdominal breathing, progressive muscle relaxation, and/or guided imagery.  The purpose of this lesson is to educate patients on the role of stress in their lives and to provide healthy techniques for coping with it.  Learning Barriers/Preferences:  Learning Barriers/Preferences - 07/20/23 1155       Learning Barriers/Preferences   Learning Barriers None    Learning Preferences Audio;Computer/Internet;Group Instruction;Individual Instruction;Skilled Demonstration;Verbal Instruction;Written Material;Video;Pictoral          Education Topics:  Knowledge Questionnaire Score:  Knowledge Questionnaire Score - 07/20/23 1155       Knowledge Questionnaire Score   Pre Score 19/24          Core Components/Risk Factors/Patient Goals at Admission:  Personal Goals and Risk Factors at Admission - 07/20/23 1156       Core Components/Risk  Factors/Patient Goals on Admission    Weight Management Yes    Intervention Weight Management: Develop a combined nutrition and exercise program designed to reach desired caloric intake, while maintaining appropriate intake of nutrient and fiber, sodium and fats, and appropriate energy expenditure required for the weight goal.;Weight Management: Provide education and appropriate resources to help participant work on and attain dietary goals.    Expected Outcomes Short Term: Continue to assess and modify interventions until short term weight is achieved;Long Term: Adherence to nutrition and physical activity/exercise program aimed toward attainment of established weight goal;Understanding recommendations for meals to include 15-35% energy as protein, 25-35% energy from fat, 35-60% energy from carbohydrates, less than 200mg  of dietary cholesterol, 20-35 gm of total fiber daily;Understanding of distribution of calorie intake throughout the day with the consumption of 4-5 meals/snacks    Hypertension Yes    Intervention Provide education on lifestyle modifcations including regular physical activity/exercise, weight management, moderate sodium restriction and increased consumption of fresh fruit, vegetables, and low fat dairy, alcohol moderation, and smoking cessation.;Monitor prescription use compliance.    Expected Outcomes Short Term: Continued assessment and intervention until BP is < 140/75mm HG in hypertensive participants. < 130/62mm HG in hypertensive participants with diabetes, heart failure or chronic kidney disease.;Long Term: Maintenance of blood pressure at goal levels.    Lipids Yes    Intervention Provide education and support for participant on nutrition & aerobic/resistive exercise along with prescribed medications to achieve LDL 70mg , HDL >40mg .    Expected Outcomes Long Term: Cholesterol controlled with medications as prescribed, with individualized exercise RX and with personalized nutrition  plan. Value goals: LDL < 70mg , HDL > 40 mg.;Short Term: Participant states  understanding of desired cholesterol values and is compliant with medications prescribed. Participant is following exercise prescription and nutrition guidelines.    Stress Yes          Core Components/Risk Factors/Patient Goals Review:   Goals and Risk Factor Review     Row Name 07/25/23 0729 08/15/23 1506           Core Components/Risk Factors/Patient Goals Review   Personal Goals Review Weight Management/Obesity;Hypertension;Lipids Weight Management/Obesity;Hypertension;Lipids      Review Bruna started cardiac rehab on 07/24/23. Pat did well with exercise. Vital signs were stable. Bruna is doing well with exercise at cardiac rehab. . Vital signs have been stable.      Expected Outcomes Bruna wil continue to participate in cardiac rehab for exercise, nutrition and lifestyle modifications Bruna wil continue to participate in cardiac rehab for exercise, nutrition and lifestyle modifications         Core Components/Risk Factors/Patient Goals at Discharge (Final Review):   Goals and Risk Factor Review - 08/15/23 1506       Core Components/Risk Factors/Patient Goals Review   Personal Goals Review Weight Management/Obesity;Hypertension;Lipids    Review Bruna is doing well with exercise at cardiac rehab. . Vital signs have been stable.    Expected Outcomes Bruna wil continue to participate in cardiac rehab for exercise, nutrition and lifestyle modifications          ITP Comments:  ITP Comments     Row Name 07/20/23 1130 07/25/23 0726 08/15/23 1502       ITP Comments Dr. Wilbert Bihari medical director. Introduction to pritikin education/intensive cardiac rehab. Initial orientation packet reviewed with patient. 30 Day ITP Review. Pat started cardiac rehab on 07/25/23. Pat did well with exercise. 30 Day ITP Review. Bruna  has good attendance and participation with exercise at cardiac rehab        Comments: See ITP  Comments

## 2023-08-16 ENCOUNTER — Encounter (HOSPITAL_COMMUNITY)
Admission: RE | Admit: 2023-08-16 | Discharge: 2023-08-16 | Disposition: A | Discharge status: Home / Self Care | Source: Ambulatory Visit | Attending: Cardiology | Admitting: Cardiology

## 2023-08-16 DIAGNOSIS — Z955 Presence of coronary angioplasty implant and graft: Secondary | ICD-10-CM | POA: Diagnosis not present

## 2023-08-16 DIAGNOSIS — I252 Old myocardial infarction: Secondary | ICD-10-CM | POA: Diagnosis not present

## 2023-08-16 DIAGNOSIS — I214 Non-ST elevation (NSTEMI) myocardial infarction: Secondary | ICD-10-CM

## 2023-08-16 DIAGNOSIS — Z48812 Encounter for surgical aftercare following surgery on the circulatory system: Secondary | ICD-10-CM | POA: Diagnosis not present

## 2023-08-16 NOTE — Progress Notes (Signed)
 Reviewed home exercise guidelines with Pat including endpoints, temperature precautions, target heart rate and rate of perceived exertion. She is currently using her foot bike 30-60 minutes daily as her mode of home exercise. Walking is limited due to back/hip pain. Pat voices understanding of instructions given.  Arnoldo CHRISTELLA Gal, MS, ACSM CEP

## 2023-08-17 DIAGNOSIS — I10 Essential (primary) hypertension: Secondary | ICD-10-CM | POA: Diagnosis not present

## 2023-08-17 DIAGNOSIS — I2510 Atherosclerotic heart disease of native coronary artery without angina pectoris: Secondary | ICD-10-CM | POA: Diagnosis not present

## 2023-08-17 DIAGNOSIS — Z8673 Personal history of transient ischemic attack (TIA), and cerebral infarction without residual deficits: Secondary | ICD-10-CM | POA: Diagnosis not present

## 2023-08-18 ENCOUNTER — Ambulatory Visit (HOSPITAL_BASED_OUTPATIENT_CLINIC_OR_DEPARTMENT_OTHER): Payer: Self-pay | Admitting: Family

## 2023-08-18 ENCOUNTER — Encounter (HOSPITAL_COMMUNITY)
Admission: RE | Admit: 2023-08-18 | Discharge: 2023-08-18 | Disposition: A | Discharge status: Home / Self Care | Source: Ambulatory Visit | Attending: Cardiology | Admitting: Cardiology

## 2023-08-18 DIAGNOSIS — Z48812 Encounter for surgical aftercare following surgery on the circulatory system: Secondary | ICD-10-CM | POA: Diagnosis not present

## 2023-08-18 DIAGNOSIS — I252 Old myocardial infarction: Secondary | ICD-10-CM | POA: Diagnosis not present

## 2023-08-18 DIAGNOSIS — Z955 Presence of coronary angioplasty implant and graft: Secondary | ICD-10-CM

## 2023-08-18 DIAGNOSIS — I214 Non-ST elevation (NSTEMI) myocardial infarction: Secondary | ICD-10-CM

## 2023-08-18 LAB — LIPID PANEL
Chol/HDL Ratio: 3 ratio (ref 0.0–4.4)
Cholesterol, Total: 139 mg/dL (ref 100–199)
HDL: 46 mg/dL (ref >39)
LDL Chol Calc (NIH): 71 mg/dL (ref 0–99)
Triglycerides: 123 mg/dL (ref 0–149)
VLDL Cholesterol Cal: 22 mg/dL (ref 5–40)

## 2023-08-18 LAB — HEPATIC FUNCTION PANEL
ALT: 10 IU/L (ref 0–32)
AST: 14 IU/L (ref 0–40)
Albumin: 3.9 g/dL (ref 3.8–4.8)
Alkaline Phosphatase: 77 IU/L (ref 44–121)
Bilirubin Total: 0.2 mg/dL (ref 0.0–1.2)
Bilirubin, Direct: 0.1 mg/dL (ref 0.00–0.40)
Total Protein: 6.2 g/dL (ref 6.0–8.5)

## 2023-08-21 ENCOUNTER — Encounter (HOSPITAL_BASED_OUTPATIENT_CLINIC_OR_DEPARTMENT_OTHER): Payer: Self-pay | Admitting: Family

## 2023-08-21 ENCOUNTER — Ambulatory Visit (HOSPITAL_BASED_OUTPATIENT_CLINIC_OR_DEPARTMENT_OTHER): Admitting: Family

## 2023-08-21 VITALS — BP 126/70 | HR 90 | Ht 61.0 in | Wt 153.3 lb

## 2023-08-21 DIAGNOSIS — R42 Dizziness and giddiness: Secondary | ICD-10-CM | POA: Diagnosis not present

## 2023-08-21 DIAGNOSIS — I25118 Atherosclerotic heart disease of native coronary artery with other forms of angina pectoris: Secondary | ICD-10-CM

## 2023-08-21 DIAGNOSIS — I10 Essential (primary) hypertension: Secondary | ICD-10-CM | POA: Diagnosis not present

## 2023-08-21 DIAGNOSIS — E785 Hyperlipidemia, unspecified: Secondary | ICD-10-CM

## 2023-08-21 MED ORDER — CLOPIDOGREL BISULFATE 75 MG PO TABS: 75.0000 mg | ORAL_TABLET | Freq: Every day | ORAL | 3 refills | Status: AC

## 2023-08-21 MED ORDER — PANTOPRAZOLE SODIUM 20 MG PO TBEC: 20.0000 mg | DELAYED_RELEASE_TABLET | Freq: Every day | ORAL | 3 refills | Status: AC

## 2023-08-21 NOTE — Patient Instructions (Addendum)
 Medication Instructions:  STOP Brilinta --please finish your current supply of this and then start the plavix   Stop taking omeprazole now  Start taking pantoprazole  (protonix ) 20 mg by mouth daily  START Plavix  (Clopidogrel ) 75mg  daily  *If you need a refill on your cardiac medications before your next appointment, please call your pharmacy*  Testing/Procedures: Your physician has requested that you have a carotid duplex. This test is an ultrasound of the carotid arteries in your neck. It looks at blood flow through these arteries that supply the brain with blood. Allow one hour for this exam. There are no restrictions or special instructions.   Follow-Up: At [facility], you and your health needs are our priority.  As part of our continuing mission to provide you with exceptional heart care, our providers are all part of one team.  This team includes your primary Cardiologist (physician) and Advanced Practice Providers or APPs (Physician Assistants and Nurse Practitioners) who all work together to provide you with the care you need, when you need it.  Your next appointment:   6 month(s)  Provider:   Shelda Bruckner, MD, Rosaline Bane, NP, or Reche Finder, NP    We recommend signing up for the patient portal called MyChart.  Sign up information is provided on this After Visit Summary.  MyChart is used to connect with patients for Virtual Visits (Telemedicine).  Patients are able to view lab/test results, encounter notes, upcoming appointments, etc.  Non-urgent messages can be sent to your provider as well.   To learn more about what you can do with MyChart, go to [email or web address].   Other Instructions  Keep up the great work at cardiac rehab!

## 2023-08-21 NOTE — Progress Notes (Signed)
 Cardiology Office Note   Date:  08/21/2023  ID:  Deshondra, Worst 06/20/1945, MRN [id number] PCP: Erick Greig DELENA, NP  [facility] Providers Cardiologist:  Shelda Bruckner, MD     History of Present Illness Sharon Marsh is a 78 y.o. female with hx of palpitations, CAD s/p DES-LAD 06/2023, familial hyperlipidemia, HTN.   Admitted 6/3-06/07/23 after transfer from [facility] ED 06/03/23 right right sided chest pain. Echo at [facility] LVEF 55-60%, normal wall motion, no significant valvular abnormalities, normal RV. LHC with 80% prox LAD lesion treated with PCI/DES. Residual 20% osital RCA followed by 30% prox RCA with FFR 0.76, intervention deferred. Recommended for DAPT x 6 months, ideally 1 year. Prior intolerance to statin.   Discussed the use of AI scribe software for clinical note transcription with the patient, who gave verbal consent to proceed.  History of Present Illness At last visit 06/20/23 Repatha  was initiated due to familial hyperlipidemia with Lp(a) 230. Simvastatin  reduced to 3 times per week due to concern for side effects.   Last seen 07/11/23 feeling lightheaded. Losartan  was discontinued. It was noted if dyspnea persisted, consider transition from Brilinta  to Plavix . She has been participating in cardiac rehab.   Presents today for follow up independently. Participating in cardiac rehab and pleased with her progress. Does note easy bruising on DAPT and skin tear after hitting her leg on recliner yesterday. She does note snoring and wakes feeling tired. Prior sleep study many years ago with borderline sleep apnea per her report.  No chest pain, pressure, tightness.  Exertional dyspnea with unusual activity.  No edema, orthopnea, PND, palpitations.  ROS: Please see the history of present illness.    All other systems reviewed and are negative.   Studies Reviewed      Cardiac Studies & Procedures    ______________________________________________________________________________________________ CARDIAC CATHETERIZATION  CARDIAC CATHETERIZATION 06/06/2023  Conclusion   Prox LAD lesion is 80% stenosed.   Prox RCA lesion is 30% stenosed.   Ost RCA to Prox RCA lesion is 20% stenosed.   A stent was successfully placed.   Post intervention, there is a 0% residual stenosis.  1.  High-grade, calcified proximal LAD lesion treated with complex OCT guided PCI requiring multiple wires, shockwave lithotripsy, and Cutting Balloon angioplasty with 1 drug-eluting stent placed. 2.  Mild, nonobstructive disease of the RCA and left circumflex. 3.  LVEDP 5 mmHg.  Recommendation: Dual antiplatelet therapy for at least 6 months and preferably 1 year.  Findings Coronary Findings Diagnostic  Dominance: Right  Left Anterior Descending Prox LAD lesion is 80% stenosed.  Left Circumflex The vessel exhibits minimal luminal irregularities.  First Obtuse Marginal Branch The vessel exhibits minimal luminal irregularities.  Second Obtuse Marginal Branch The vessel exhibits minimal luminal irregularities.  Third Obtuse Marginal Branch The vessel exhibits minimal luminal irregularities.  Right Coronary Artery Ost RCA to Prox RCA lesion is 20% stenosed. Prox RCA lesion is 30% stenosed.  Intervention  Prox LAD lesion Stent A stent was successfully placed. Post-Intervention Lesion Assessment The intervention was successful. Pre-interventional TIMI flow is 3. Post-intervention TIMI flow is 3. There is a 0% residual stenosis post intervention.     ECHOCARDIOGRAM  ECHOCARDIOGRAM COMPLETE 07/30/2019  Narrative ECHOCARDIOGRAM REPORT    Patient Name:   Sharon Marsh Date of Exam: 07/30/2019 Medical Rec #:  [id number]         Height:       63.0 in Accession #:    [id number]  Weight:       181.0 lb Date of Birth:  10/22/1945        BSA:          1.853 m Patient Age:    73 years           BP:           150/80 mmHg Patient Gender: F                 HR:           61 bpm. Exam Location:  [place]  Procedure: 2D Echo  Indications:    Murmur 785.2 / R01.1  History:        Patient has no prior history of Echocardiogram examinations. Risk Factors:Hypertension.  Sonographer:    Lynwood Silvas Referring Phys: CYRUS JENNIFER SAUNDERS [facility]  IMPRESSIONS   1. Left ventricular ejection fraction, by estimation, is 60 to 65%. The left ventricle has normal function. The left ventricle has no regional wall motion abnormalities. There is mild concentric left ventricular hypertrophy. Left ventricular diastolic parameters are consistent with Grade I diastolic dysfunction (impaired relaxation). 2. Right ventricular systolic function is normal. The right ventricular size is normal. There is normal pulmonary artery systolic pressure. 3. The mitral valve is degenerative. Mild mitral valve regurgitation. No evidence of mitral stenosis. 4. The aortic valve is tricuspid. Aortic valve regurgitation is mild. No aortic stenosis is present. 5. The inferior vena cava is normal in size with greater than 50% respiratory variability, suggesting right atrial pressure of 3 mmHg.  FINDINGS Left Ventricle: Left ventricular ejection fraction, by estimation, is 60 to 65%. The left ventricle has normal function. The left ventricle has no regional wall motion abnormalities. The left ventricular internal cavity size was normal in size. There is mild concentric left ventricular hypertrophy. Left ventricular diastolic parameters are consistent with Grade I diastolic dysfunction (impaired relaxation). Indeterminate filling pressures.  Right Ventricle: The right ventricular size is normal. No increase in right ventricular wall thickness. Right ventricular systolic function is normal. There is normal pulmonary artery systolic pressure. The tricuspid regurgitant velocity is 2.19 m/s, and with an assumed right atrial pressure of 3  mmHg, the estimated right ventricular systolic pressure is 22.2 mmHg.  Left Atrium: Left atrial size was normal in size.  Right Atrium: Right atrial size was normal in size.  Pericardium: There is no evidence of pericardial effusion.  Mitral Valve: The mitral valve is degenerative in appearance. Normal mobility of the mitral valve leaflets. Mild mitral annular calcification. Mild mitral valve regurgitation. No evidence of mitral valve stenosis.  Tricuspid Valve: The tricuspid valve is normal in structure. Tricuspid valve regurgitation is trivial. No evidence of tricuspid stenosis.  Aortic Valve: The aortic valve is tricuspid. . There is mild thickening of the aortic valve. Aortic valve regurgitation is mild. Aortic regurgitation PHT measures 509 msec. No aortic stenosis is present. There is mild thickening of the aortic valve.  Pulmonic Valve: The pulmonic valve was normal in structure. Pulmonic valve regurgitation is not visualized. No evidence of pulmonic stenosis.  Aorta: The aortic root and ascending aorta are structurally normal, with no evidence of dilitation.  Venous: The pulmonary veins were not well visualized. The inferior vena cava is normal in size with greater than 50% respiratory variability, suggesting right atrial pressure of 3 mmHg.  IAS/Shunts: No atrial level shunt detected by color flow Doppler.   LEFT VENTRICLE PLAX 2D LVIDd:         4.60  cm  Diastology LVIDs:         3.00 cm  LV e' lateral:   3.37 cm/s LV PW:         1.30 cm  LV E/e' lateral: 26.4 LV IVS:        1.30 cm  LV e' medial:    3.26 cm/s LVOT diam:     2.00 cm  LV E/e' medial:  27.2 LV SV:         68 LV SV Index:   37 LVOT Area:     3.14 cm   RIGHT VENTRICLE             IVC RV S prime:     12.30 cm/s  IVC diam: 1.30 cm TAPSE (M-mode): 2.0 cm  LEFT ATRIUM             Index       RIGHT ATRIUM           Index LA diam:        4.50 cm 2.43 cm/m  RA Area:     14.90 cm LA Vol (A2C):   48.4 ml 26.12  ml/m RA Volume:   36.40 ml  19.64 ml/m LA Vol (A4C):   37.0 ml 19.96 ml/m LA Biplane Vol: 43.1 ml 23.26 ml/m AORTIC VALVE LVOT Vmax:   89.40 cm/s LVOT Vmean:  62.100 cm/s LVOT VTI:    0.216 m AI PHT:      509 msec  AORTA Ao Root diam: 3.20 cm Ao Asc diam:  3.40 cm  MITRAL VALVE                TRICUSPID VALVE MV Area (PHT): 3.27 cm     TR Peak grad:   19.2 mmHg MV Decel Time: 232 msec     TR Vmax:        219.00 cm/s MV E velocity: 88.80 cm/s MV A velocity: 106.00 cm/s  SHUNTS MV E/A ratio:  0.84         Systemic VTI:  0.22 m Systemic Diam: 2.00 cm  Redell Leiter MD Electronically signed by Redell Leiter MD Signature Date/Time: 07/30/2019/5:15:16 PM    Final    MONITORS  LONG TERM MONITOR (3-14 DAYS) 07/27/2020  Narrative 12 days of data recorded on Zio monitor. Patient had a min HR of 48 bpm, max HR of 193 bpm, and avg HR of 77 bpm. Predominant underlying rhythm was Sinus Rhythm. 8 Supraventricular Tachycardia runs occurred, the run with the fastest interval lasting 6 beats with a max rate of 193 bpm, the longest lasting 12 beats with an avg rate of 147 bpm. No VT, atrial fibrillation, high degree block, or pauses noted. Isolated atrial and ventricular ectopy was rare (<1%). There were 22 triggered events, which were sinus. No significant arrhythmias detected.   CT SCANS  CT CARDIAC SCORING (SELF PAY ONLY) 10/01/2019  Addendum 10/01/2019  6:07 PM ADDENDUM REPORT: 10/01/2019 18:05  CLINICAL DATA:  Risk stratification  EXAM: Coronary Calcium  Score  TECHNIQUE: The patient was scanned on a [employer] scanner. Axial non-contrast 3 mm slices were carried out through the heart. The data set was analyzed on a dedicated work station and scored using the Agatson method.  FINDINGS: Non-cardiac: See separate report from [facility].  Ascending Aorta: Normal  Pericardium: Normal  Coronary arteries: Normal origin  IMPRESSION: Coronary calcium  score of 226  . This was 79 percentile for age and sex matched control.  Kardie Tobb,DO  Electronically Signed By: Kardie  Tobb DO On: 10/01/2019 18:05  Narrative EXAM: OVER-READ INTERPRETATION  CT CHEST  The following report is an over-read performed by radiologist Dr. Marcey Moan of [facility] on 10/01/2019. This over-read does not include interpretation of cardiac or coronary anatomy or pathology. The coronary calcium  score interpretation by the cardiologist is attached.  COMPARISON:  None.  FINDINGS: Vascular: No significant vascular findings. Normal heart size. No pericardial effusion.  Mediastinum/Nodes: No visualized enlarged mediastinal or axillary lymph nodes.  Lungs/Pleura: Visualized lungs show no evidence of pulmonary edema, consolidation, pneumothorax, nodule or pleural fluid.  Upper Abdomen: No acute abnormality.  Musculoskeletal: No chest wall mass or suspicious bone lesions identified.  IMPRESSION: No significant incidental findings.  Electronically Signed: By: Marcey Moan M.D. On: 10/01/2019 13:46     ______________________________________________________________________________________________        Risk Assessment/Calculations          Physical Exam VS:  BP 126/70   Pulse 90   Ht 5' 1 (1.549 m)   Wt 153 lb 4.8 oz (69.5 kg)   SpO2 99%   BMI 28.97 kg/m    Wt Readings from Last 3 Encounters:  08/21/23 153 lb 4.8 oz (69.5 kg)  07/20/23 156 lb 4.9 oz (70.9 kg)  07/11/23 155 lb 4.8 oz (70.4 kg)    GEN: Well nourished, well developed in no acute distress NECK: No JVD; No carotid bruits CARDIAC: RRR, no murmurs, rubs, gallops RESPIRATORY:  Clear to auscultation without rales, wheezing or rhonchi  ABDOMEN: Soft, non-tender, non-distended EXTREMITIES:  No edema; No deformity   ASSESSMENT AND PLAN  Assessment & Plan Coronary artery disease s/p DES-LAD 06/2023 Post-stent placement in the left anterior descending artery with  previous 80% blockage, now 0% post-stent. Mild 20-30% blockage in the right coronary artery, managed medically. No ischemic symptoms.  - Continue DAPT for at least six months, preferably a year.-Stop Brilinta , start Clopidogrel  (Plavix ) 75mg  daily for cost. - Encouraged to continue to participate in cardiac rehabilitation for monitored exercise and activity guidance.   Hyperlipidemia with elevated lipoprotein(a) / LDL goal <55 Lipoprotein(a) level significantly elevated at 230, indicating familial hyperlipidemia. 08/17/23 LDL 71. - Continue Repatha  140mg  q14 days. -Previously did not tolerate statin with myalgia.  Hypertension Lightheadedness resolved since discontinuation of losartan .  BP at goal less than 130/80 at home and in clinic. - Continue metoprolol  succinate 50mg  daily - Discussed to monitor BP at home at least 2 hours after medications and sitting for 5-10 minutes.   Snoring She notes she symptoms wakes her self up from sleep with snoring.  Notes feeling tired in the morning which she is uncertain if it is related to poor sleep or sleep apnea.  Prior sleep study many years ago with borderline sleep apnea.            Dispo: follow up in 6 months  Signed, Reche GORMAN Finder, NP

## 2023-08-23 ENCOUNTER — Encounter (HOSPITAL_COMMUNITY)
Admission: RE | Admit: 2023-08-23 | Discharge: 2023-08-23 | Disposition: A | Discharge status: Home / Self Care | Source: Ambulatory Visit | Attending: Cardiology

## 2023-08-23 DIAGNOSIS — I252 Old myocardial infarction: Secondary | ICD-10-CM | POA: Diagnosis not present

## 2023-08-23 DIAGNOSIS — I214 Non-ST elevation (NSTEMI) myocardial infarction: Secondary | ICD-10-CM

## 2023-08-23 DIAGNOSIS — Z955 Presence of coronary angioplasty implant and graft: Secondary | ICD-10-CM | POA: Diagnosis not present

## 2023-08-23 DIAGNOSIS — M9903 Segmental and somatic dysfunction of lumbar region: Secondary | ICD-10-CM | POA: Diagnosis not present

## 2023-08-23 DIAGNOSIS — Z48812 Encounter for surgical aftercare following surgery on the circulatory system: Secondary | ICD-10-CM | POA: Diagnosis not present

## 2023-08-23 DIAGNOSIS — M5136 Other intervertebral disc degeneration, lumbar region with discogenic back pain only: Secondary | ICD-10-CM | POA: Diagnosis not present

## 2023-08-25 ENCOUNTER — Encounter (HOSPITAL_COMMUNITY)
Admission: RE | Admit: 2023-08-25 | Discharge: 2023-08-25 | Disposition: A | Discharge status: Home / Self Care | Source: Ambulatory Visit | Attending: Cardiology | Admitting: Cardiology

## 2023-08-25 DIAGNOSIS — I214 Non-ST elevation (NSTEMI) myocardial infarction: Secondary | ICD-10-CM

## 2023-08-25 DIAGNOSIS — Z955 Presence of coronary angioplasty implant and graft: Secondary | ICD-10-CM

## 2023-08-25 DIAGNOSIS — Z48812 Encounter for surgical aftercare following surgery on the circulatory system: Secondary | ICD-10-CM | POA: Diagnosis not present

## 2023-08-25 DIAGNOSIS — I252 Old myocardial infarction: Secondary | ICD-10-CM | POA: Diagnosis not present

## 2023-08-28 ENCOUNTER — Encounter (HOSPITAL_COMMUNITY)
Admission: RE | Admit: 2023-08-28 | Discharge: 2023-08-28 | Disposition: A | Discharge status: Home / Self Care | Source: Ambulatory Visit | Attending: Cardiology | Admitting: Cardiology

## 2023-08-28 DIAGNOSIS — Z955 Presence of coronary angioplasty implant and graft: Secondary | ICD-10-CM | POA: Diagnosis not present

## 2023-08-28 DIAGNOSIS — I252 Old myocardial infarction: Secondary | ICD-10-CM | POA: Diagnosis not present

## 2023-08-28 DIAGNOSIS — I214 Non-ST elevation (NSTEMI) myocardial infarction: Secondary | ICD-10-CM

## 2023-08-28 DIAGNOSIS — Z48812 Encounter for surgical aftercare following surgery on the circulatory system: Secondary | ICD-10-CM | POA: Diagnosis not present

## 2023-08-29 ENCOUNTER — Other Ambulatory Visit (HOSPITAL_COMMUNITY): Payer: Self-pay

## 2023-08-29 DIAGNOSIS — M5136 Other intervertebral disc degeneration, lumbar region with discogenic back pain only: Secondary | ICD-10-CM | POA: Diagnosis not present

## 2023-08-29 DIAGNOSIS — M9903 Segmental and somatic dysfunction of lumbar region: Secondary | ICD-10-CM | POA: Diagnosis not present

## 2023-08-30 ENCOUNTER — Encounter (HOSPITAL_COMMUNITY)
Admission: RE | Admit: 2023-08-30 | Discharge: 2023-08-30 | Disposition: A | Discharge status: Home / Self Care | Source: Ambulatory Visit | Attending: Cardiology

## 2023-08-30 DIAGNOSIS — Z48812 Encounter for surgical aftercare following surgery on the circulatory system: Secondary | ICD-10-CM | POA: Diagnosis not present

## 2023-08-30 DIAGNOSIS — Z955 Presence of coronary angioplasty implant and graft: Secondary | ICD-10-CM

## 2023-08-30 DIAGNOSIS — I252 Old myocardial infarction: Secondary | ICD-10-CM | POA: Diagnosis not present

## 2023-08-30 DIAGNOSIS — I214 Non-ST elevation (NSTEMI) myocardial infarction: Secondary | ICD-10-CM

## 2023-09-01 ENCOUNTER — Encounter (HOSPITAL_COMMUNITY)
Admission: RE | Admit: 2023-09-01 | Discharge: 2023-09-01 | Disposition: A | Discharge status: Home / Self Care | Source: Ambulatory Visit | Attending: Cardiology | Admitting: Cardiology

## 2023-09-01 DIAGNOSIS — I252 Old myocardial infarction: Secondary | ICD-10-CM | POA: Diagnosis not present

## 2023-09-01 DIAGNOSIS — Z955 Presence of coronary angioplasty implant and graft: Secondary | ICD-10-CM | POA: Diagnosis not present

## 2023-09-01 DIAGNOSIS — I214 Non-ST elevation (NSTEMI) myocardial infarction: Secondary | ICD-10-CM

## 2023-09-01 DIAGNOSIS — Z48812 Encounter for surgical aftercare following surgery on the circulatory system: Secondary | ICD-10-CM | POA: Diagnosis not present

## 2023-09-06 ENCOUNTER — Encounter (HOSPITAL_COMMUNITY)
Admission: RE | Admit: 2023-09-06 | Discharge: 2023-09-06 | Disposition: A | Discharge status: Home / Self Care | Source: Ambulatory Visit | Attending: Cardiology | Admitting: Cardiology

## 2023-09-06 DIAGNOSIS — D692 Other nonthrombocytopenic purpura: Secondary | ICD-10-CM | POA: Diagnosis not present

## 2023-09-06 DIAGNOSIS — Z85828 Personal history of other malignant neoplasm of skin: Secondary | ICD-10-CM | POA: Diagnosis not present

## 2023-09-06 DIAGNOSIS — I252 Old myocardial infarction: Secondary | ICD-10-CM | POA: Diagnosis not present

## 2023-09-06 DIAGNOSIS — I214 Non-ST elevation (NSTEMI) myocardial infarction: Secondary | ICD-10-CM

## 2023-09-06 DIAGNOSIS — L812 Freckles: Secondary | ICD-10-CM | POA: Diagnosis not present

## 2023-09-06 DIAGNOSIS — Z48812 Encounter for surgical aftercare following surgery on the circulatory system: Secondary | ICD-10-CM | POA: Insufficient documentation

## 2023-09-06 DIAGNOSIS — Z955 Presence of coronary angioplasty implant and graft: Secondary | ICD-10-CM | POA: Diagnosis not present

## 2023-09-06 DIAGNOSIS — L820 Inflamed seborrheic keratosis: Secondary | ICD-10-CM | POA: Diagnosis not present

## 2023-09-06 DIAGNOSIS — L821 Other seborrheic keratosis: Secondary | ICD-10-CM | POA: Diagnosis not present

## 2023-09-06 DIAGNOSIS — L570 Actinic keratosis: Secondary | ICD-10-CM | POA: Diagnosis not present

## 2023-09-08 ENCOUNTER — Encounter (HOSPITAL_COMMUNITY)
Admission: RE | Admit: 2023-09-08 | Discharge: 2023-09-08 | Disposition: A | Discharge status: Home / Self Care | Source: Ambulatory Visit | Attending: Cardiology | Admitting: Cardiology

## 2023-09-08 DIAGNOSIS — I214 Non-ST elevation (NSTEMI) myocardial infarction: Secondary | ICD-10-CM

## 2023-09-08 DIAGNOSIS — Z48812 Encounter for surgical aftercare following surgery on the circulatory system: Secondary | ICD-10-CM | POA: Diagnosis not present

## 2023-09-08 DIAGNOSIS — R300 Dysuria: Secondary | ICD-10-CM | POA: Diagnosis not present

## 2023-09-08 DIAGNOSIS — Z955 Presence of coronary angioplasty implant and graft: Secondary | ICD-10-CM | POA: Diagnosis not present

## 2023-09-08 DIAGNOSIS — I10 Essential (primary) hypertension: Secondary | ICD-10-CM | POA: Diagnosis not present

## 2023-09-08 DIAGNOSIS — I252 Old myocardial infarction: Secondary | ICD-10-CM | POA: Diagnosis not present

## 2023-09-11 ENCOUNTER — Encounter (HOSPITAL_COMMUNITY)
Admission: RE | Admit: 2023-09-11 | Discharge: 2023-09-11 | Disposition: A | Discharge status: Home / Self Care | Source: Ambulatory Visit | Attending: Cardiology

## 2023-09-11 ENCOUNTER — Ambulatory Visit (INDEPENDENT_AMBULATORY_CARE_PROVIDER_SITE_OTHER)

## 2023-09-11 DIAGNOSIS — I214 Non-ST elevation (NSTEMI) myocardial infarction: Secondary | ICD-10-CM

## 2023-09-11 DIAGNOSIS — M9903 Segmental and somatic dysfunction of lumbar region: Secondary | ICD-10-CM | POA: Diagnosis not present

## 2023-09-11 DIAGNOSIS — Z955 Presence of coronary angioplasty implant and graft: Secondary | ICD-10-CM

## 2023-09-11 DIAGNOSIS — R42 Dizziness and giddiness: Secondary | ICD-10-CM

## 2023-09-11 DIAGNOSIS — M5136 Other intervertebral disc degeneration, lumbar region with discogenic back pain only: Secondary | ICD-10-CM | POA: Diagnosis not present

## 2023-09-11 DIAGNOSIS — I2510 Atherosclerotic heart disease of native coronary artery without angina pectoris: Secondary | ICD-10-CM

## 2023-09-11 DIAGNOSIS — Z48812 Encounter for surgical aftercare following surgery on the circulatory system: Secondary | ICD-10-CM | POA: Diagnosis not present

## 2023-09-11 DIAGNOSIS — I252 Old myocardial infarction: Secondary | ICD-10-CM | POA: Diagnosis not present

## 2023-09-11 NOTE — Progress Notes (Signed)
 Cardiac Individual Treatment Plan  Patient Details  Name: Sharon Marsh MRN: [id number] Date of Birth: 03-Nov-1945 Referring Provider:   Flowsheet Row INTENSIVE CARDIAC REHAB ORIENT from 07/20/2023 in [facility]  Referring Provider Shelda Grizzle, MD    Initial Encounter Date:  Flowsheet Row INTENSIVE CARDIAC REHAB ORIENT from 07/20/2023 in [facility]  Date 07/20/23    Visit Diagnosis: 05/16/23 NSTEMI (non-ST elevated myocardial infarction) (HCC)  06/06/23 DES LAD  Patient's Home Medications on Admission:  Current Outpatient Medications:    ACETYLCYSTEINE PO, Take 1 each by mouth daily. Also known as NAC (pt knows as this) - Take 1 capsules by mouth once daily. May also take extra if needed., Disp: , Rfl:    Ascorbic Acid 1000 MG CHEW, Chew 1,000 mg by mouth daily., Disp: , Rfl:    aspirin  EC 81 MG tablet, Take 1 tablet (81 mg total) by mouth daily. Swallow whole., Disp: 150 tablet, Rfl: 2   b complex vitamins tablet, Take 1 tablet by mouth daily., Disp: , Rfl:    cholecalciferol (VITAMIN D3) 25 MCG (1000 UNIT) tablet, Take 1,000 Units by mouth daily., Disp: , Rfl:    clopidogrel  (PLAVIX ) 75 MG tablet, Take 1 tablet (75 mg total) by mouth daily., Disp: 90 tablet, Rfl: 3   Coenzyme Q10 200 MG capsule, Take by mouth daily., Disp: , Rfl:    Collagen Hydrolysate POWD, 1 Scoop by Does not apply route daily., Disp: , Rfl:    Evolocumab  (REPATHA  SURECLICK) 140 MG/ML SOAJ, Inject 140 mg into the skin every 14 (fourteen) days., Disp: 2 mL, Rfl: 6   MAGNESIUM PO, Take 2 each by mouth at bedtime. Take 2 gummies by mouth every night, Disp: , Rfl:    metoprolol  succinate (TOPROL  XL) 50 MG 24 hr tablet, Take 1 tablet (50 mg total) by mouth daily. Take with or immediately following a meal., Disp: 90 tablet, Rfl: 3   nitroGLYCERIN  (NITROSTAT ) 0.4 MG SL tablet, Place 1 tablet  (0.4 mg total) under the tongue every 5 (five) minutes as needed for chest pain., Disp: 25 tablet, Rfl: 3   pantoprazole  (PROTONIX ) 20 MG tablet, Take 1 tablet (20 mg total) by mouth daily., Disp: 90 tablet, Rfl: 3   zolpidem (AMBIEN) 10 MG tablet, Take 10 mg by mouth at bedtime., Disp: , Rfl:   Past Medical History: 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   Obstructive sleep apnea    Osteoarthritis of left knee 08/09/2017   Osteoarthritis of right knee 10/03/2018   Osteoporosis 02/12/2019   Other and unspecified hyperlipidemia    Personal history of vulvar dysplasia    PONV (postoperative nausea and vomiting)    Unspecified essential hypertension 08/15/2013   Unspecified hereditary and idiopathic peripheral neuropathy 08/15/2013    Tobacco Use: Social History   Tobacco Use  Smoking Status Former  Smokeless Tobacco Never    Labs: Review Flowsheet       Latest Ref Rng & Units 10/29/2019 06/07/2023 08/17/2023  Labs for ITP Cardiac and Pulmonary Rehab  Cholestrol 100 - 199 mg/dL -  222  139   LDL (calc) 0 - 99 mg/dL - 862  71   HDL-C >60 mg/dL - 48  46   Trlycerides 0 - 149 mg/dL - 813  876   TCO2 22 - 32 mmol/L 23  - -    Capillary Blood Glucose: No results found for: GLUCAP   Exercise Target Goals: Exercise Program Goal: Individual exercise prescription set using results from initial 6 min walk test and THRR while considering  patient's activity barriers and safety.   Exercise Prescription Goal: Initial exercise prescription builds to 30-45 minutes a day of aerobic activity, 2-3 days per week.  Home exercise guidelines will be given to patient during program as part of exercise  prescription that the participant will acknowledge.  Activity Barriers & Risk Stratification:  Activity Barriers & Cardiac Risk Stratification - 07/20/23 1133       Activity Barriers & Cardiac Risk Stratification   Activity Barriers Arthritis;Joint Problems;Assistive Device;Deconditioning;Balance Concerns;History of Falls;Neck/Spine Problems    Cardiac Risk Stratification High   <5 METs on         6 Minute Walk:  6 Minute Walk     Row Name 07/20/23 1342         6 Minute Walk   Phase Initial     Distance 1130 feet     Walk Time 6 minutes     # of Rest Breaks 0     MPH 2.14     METS 2.06     RPE 10.5     Perceived Dyspnea  0     VO2 Peak 7.22     Symptoms No     Resting HR 71 bpm     Resting BP 122/64     Resting Oxygen Saturation  100 %     Exercise Oxygen Saturation  during 6 min walk 99 %     Max Ex. HR 118 bpm     Max Ex. BP 130/66     2 Minute Post BP 114/68        Oxygen Initial Assessment:   Oxygen Re-Evaluation:   Oxygen Discharge (Final Oxygen Re-Evaluation):   Initial Exercise Prescription:  Initial Exercise Prescription - 07/20/23 1100       Date of Initial Exercise RX and Referring Provider   Date 07/20/23    Referring Provider Shelda Grizzle, MD    Expected Discharge Date 10/11/23      NuStep   Level 1    SPM 60    Minutes 15    METs 2      Track   Laps 12    Minutes 15    METs 2      Prescription Details   Frequency (times per week) 3    Duration Progress to 30 minutes of continuous aerobic without signs/symptoms of physical distress      Intensity   THRR 40-80% of Max Heartrate 56-112    Ratings of Perceived Exertion 11-13    Perceived Dyspnea 0-4      Progression   Progression Continue progressive overload as per policy without signs/symptoms or physical distress.      Resistance Training   Training Prescription Yes    Weight 2    Reps 10-15          Perform Capillary Blood Glucose checks as  needed.  Exercise Prescription Changes:   Exercise Prescription Changes     Row Name 07/24/23 1040 08/07/23 1041 08/16/23 1029 09/06/23 1035  Response to Exercise   Blood Pressure (Admit) 128/66 140/68 104/62 128/64    Blood Pressure (Exercise) 130/58 126/78 124/60 --    Blood Pressure (Exit) 128/70 120/62 106/58 130/72    Heart Rate (Admit) 92 bpm 87 bpm 81 bpm 72 bpm    Heart Rate (Exercise) 131 bpm 126 bpm 135 bpm 131 bpm    Heart Rate (Exit) 99 bpm 96 bpm 78 bpm 81 bpm    Rating of Perceived Exertion (Exercise) 13 12 11 12     Symptoms None None None None    Comments Off to a good start with exercise. -- -- --    Duration Continue with 30 min of aerobic exercise without signs/symptoms of physical distress. Continue with 30 min of aerobic exercise without signs/symptoms of physical distress. Continue with 30 min of aerobic exercise without signs/symptoms of physical distress. Continue with 30 min of aerobic exercise without signs/symptoms of physical distress.    Intensity THRR unchanged THRR unchanged THRR unchanged THRR unchanged      Progression   Progression Continue to progress workloads to maintain intensity without signs/symptoms of physical distress. Continue to progress workloads to maintain intensity without signs/symptoms of physical distress. Continue to progress workloads to maintain intensity without signs/symptoms of physical distress. Continue to progress workloads to maintain intensity without signs/symptoms of physical distress.    Average METs 2.7 2.7 3 2.8      Resistance Training   Training Prescription Yes Yes No No    Weight 2 lbs 2 lbs Relaxation day, no weights. Relaxation day, no weights.    Reps 10-15 10-15 -- --    Time 5 Minutes 5 Minutes -- --      Interval Training   Interval Training No No No No      NuStep   Level 5 4 4 4     SPM 63 76 83 70    Minutes 15 15 15 15     METs 2.6 2.6 3.1 2.9      Track   Laps 15 14 15 13     Minutes 15 15  15 15     METs 2.91 2.79 2.91 2.66      Home Exercise Plan   Plans to continue exercise at -- -- Home (comment)  Seated, foot bike Home (comment)  Seated, foot bike    Frequency -- -- Add 4 additional days to program exercise sessions. Add 4 additional days to program exercise sessions.    Initial Home Exercises Provided -- -- 08/16/23 08/16/23       Exercise Comments:   Exercise Comments     Row Name 07/24/23 1134 08/14/23 1151 08/16/23 1108       Exercise Comments Sharon Marsh tolerated exercise equipment and stretches without any concerns. Oriented her to the equipment and stretching routine. Reviewed METs with Sharon Marsh. Reviewed home exercise guidelines and goals with Sharon Marsh.        Exercise Goals and Review:   Exercise Goals     Row Name 07/20/23 1133             Exercise Goals   Increase Physical Activity Yes       Intervention Provide advice, education, support and counseling about physical activity/exercise needs.;Develop an individualized exercise prescription for aerobic and resistive training based on initial evaluation findings, risk stratification, comorbidities and participant's personal goals.       Expected Outcomes Short Term: Attend rehab on a regular basis to increase amount of physical activity.;Long Term: Exercising regularly at least  3-5 days a week.;Long Term: Add in home exercise to make exercise part of routine and to increase amount of physical activity.       Increase Strength and Stamina Yes       Intervention Provide advice, education, support and counseling about physical activity/exercise needs.;Develop an individualized exercise prescription for aerobic and resistive training based on initial evaluation findings, risk stratification, comorbidities and participant's personal goals.       Expected Outcomes Short Term: Increase workloads from initial exercise prescription for resistance, speed, and METs.;Short Term: Perform resistance training exercises routinely  during rehab and add in resistance training at home;Long Term: Improve cardiorespiratory fitness, muscular endurance and strength as measured by increased METs and functional capacity ( )       Able to understand and use rate of perceived exertion (RPE) scale Yes       Intervention Provide education and explanation on how to use RPE scale       Expected Outcomes Short Term: Able to use RPE daily in rehab to express subjective intensity level;Long Term:  Able to use RPE to guide intensity level when exercising independently       Knowledge and understanding of Target Heart Rate Range (THRR) Yes       Intervention Provide education and explanation of THRR including how the numbers were predicted and where they are located for reference       Expected Outcomes Short Term: Able to state/look up THRR;Long Term: Able to use THRR to govern intensity when exercising independently;Short Term: Able to use daily as guideline for intensity in rehab       Understanding of Exercise Prescription Yes       Intervention Provide education, explanation, and written materials on patient's individual exercise prescription       Expected Outcomes Short Term: Able to explain program exercise prescription;Long Term: Able to explain home exercise prescription to exercise independently          Exercise Goals Re-Evaluation :  Exercise Goals Re-Evaluation     Row Name 07/24/23 1134 08/16/23 1108           Exercise Goal Re-Evaluation   Exercise Goals Review Increase Physical Activity;Increase Strength and Stamina;Able to understand and use rate of perceived exertion (RPE) scale Increase Physical Activity;Increase Strength and Stamina;Able to understand and use rate of perceived exertion (RPE) scale;Understanding of Exercise Prescription;Knowledge and understanding of Target Heart Rate Range (THRR)      Comments Sharon Marsh was able to understand and use RPE scale appropriately. Reviewed exercise prescription with Sharon Marsh. She doe  ssome walking but is limited due to back/ hip pain. She has a foot bike that she uses daily.      Expected Outcomes Progress workloads as tolerated to help improve cardiorespiratory fitness. Continue daily exercise to help increase strength and stamina and improve balance. Be able to walk more without pain.         Discharge Exercise Prescription (Final Exercise Prescription Changes):  Exercise Prescription Changes - 09/06/23 1035       Response to Exercise   Blood Pressure (Admit) 128/64    Blood Pressure (Exit) 130/72    Heart Rate (Admit) 72 bpm    Heart Rate (Exercise) 131 bpm    Heart Rate (Exit) 81 bpm    Rating of Perceived Exertion (Exercise) 12    Symptoms None    Duration Continue with 30 min of aerobic exercise without signs/symptoms of physical distress.    Intensity THRR unchanged  Progression   Progression Continue to progress workloads to maintain intensity without signs/symptoms of physical distress.    Average METs 2.8      Resistance Training   Training Prescription No    Weight Relaxation day, no weights.      Interval Training   Interval Training No      NuStep   Level 4    SPM 70    Minutes 15    METs 2.9      Track   Laps 13    Minutes 15    METs 2.66      Home Exercise Plan   Plans to continue exercise at Home (comment)   Seated, foot bike   Frequency Add 4 additional days to program exercise sessions.    Initial Home Exercises Provided 08/16/23          Nutrition:  Target Goals: Understanding of nutrition guidelines, daily intake of sodium 1500mg , cholesterol 200mg , calories 30% from fat and 7% or less from saturated fats, daily to have 5 or more servings of fruits and vegetables.  Biometrics:  Pre Biometrics - 07/20/23 1132       Pre Biometrics   Waist Circumference 35.5 inches    Hip Circumference 43 inches    Waist to Hip Ratio 0.83 %    Triceps Skinfold 26 mm    % Body Fat 41.1 %    Grip Strength 20 kg    Flexibility  --   back pain- not done   Single Leg Stand 3.62 seconds           Nutrition Therapy Plan and Nutrition Goals:  Nutrition Therapy & Goals - 08/28/23 1116       Nutrition Therapy   Diet Heart Healthy diet      Personal Nutrition Goals   Nutrition Goal Patient to identify strategies for reducing cardiovascular risk by attending the [facility] education and nutrition series weekly.    Personal Goal #2 Patient to improve diet quality by using the plate method as a guide for meal planning to include lean protein/plant protein, fruits, vegetables, whole grains, nonfat dairy as part of a well-balanced diet.    Comments Goals in action. Sharon Marsh has medical history of  CAD, NSTEMI, s/p DES-LAD, familial hyperlipidemia, HTN, OSA. LDL is not at goal of <55; was started on repatha  on 6/17 due to statin intolerance. She continues to attend the [facility] education/nutrition series regularly. She has maintained her weight since starting with our program. Patient will benefit from participation in intensive cardiac rehab for nutrition education, exercise, and lifestyle modification.      Intervention Plan   Intervention Prescribe, educate and counsel regarding individualized specific dietary modifications aiming towards targeted core components such as weight, hypertension, lipid management, diabetes, heart failure and other comorbidities.;Nutrition handout(s) given to patient.    Expected Outcomes Short Term Goal: Understand basic principles of dietary content, such as calories, fat, sodium, cholesterol and nutrients.;Long Term Goal: Adherence to prescribed nutrition plan.          Nutrition Assessments:  Nutrition Assessments - 07/24/23 1127       Rate Your Plate Scores   Pre Score 63         MEDIFICTS Score Key: >=70 Need to make dietary changes  40-70 Heart Healthy Diet <= 40 Therapeutic Level Cholesterol Diet   Flowsheet Row INTENSIVE CARDIAC REHAB from 07/24/2023 in [facility]  Picture Your Plate Total  Score on Admission 63   Picture Your Plate Scores: <59 Unhealthy dietary pattern with much room for improvement. 41-50 Dietary pattern unlikely to meet recommendations for good health and room for improvement. 51-60 More healthful dietary pattern, with some room for improvement.  >60 Healthy dietary pattern, although there may be some specific behaviors that could be improved.    Nutrition Goals Re-Evaluation:  Nutrition Goals Re-Evaluation     Row Name 07/24/23 1133 08/28/23 1116           Goals   Current Weight 156 lb 4.9 oz (70.9 kg) 156 lb 4.9 oz (70.9 kg)      Comment Lpa 230, cholesterol 222, triglycerides 186, LDL 137, Cr 1.03, GFR 55, LPa 230, LDL 71, HDL 46      Expected Outcome Sharon Marsh has medical history of CAD, NSTEMI, s/p DES-LAD, familial hyperlipidemia, HTN, OSA. LDL is not at goal of <55; was started on repatha  on 6/17. Patient will benefit from participation in intensive cardiac rehab for nutrition education, exercise, and lifestyle modification. Goals in action. Sharon Marsh has medical history of CAD, NSTEMI, s/p DES-LAD, familial hyperlipidemia, HTN, OSA. LDL is not at goal of <55; was started on repatha  on 6/17 due to statin intolerance. She continues to attend the [facility] education/nutrition series regularly. She has maintained her weight since starting with our program. Patient will benefit from participation in intensive cardiac rehab for nutrition education, exercise, and lifestyle modification.         Nutrition Goals Re-Evaluation:  Nutrition Goals Re-Evaluation     Row Name 07/24/23 1133 08/28/23 1116           Goals   Current Weight 156 lb 4.9 oz (70.9 kg) 156 lb 4.9 oz (70.9 kg)      Comment Lpa 230, cholesterol 222, triglycerides 186, LDL 137, Cr 1.03, GFR 55, LPa 230, LDL 71, HDL 46      Expected Outcome Sharon Marsh has medical history of CAD, NSTEMI, s/p DES-LAD, familial hyperlipidemia, HTN,  OSA. LDL is not at goal of <55; was started on repatha  on 6/17. Patient will benefit from participation in intensive cardiac rehab for nutrition education, exercise, and lifestyle modification. Goals in action. Sharon Marsh has medical history of CAD, NSTEMI, s/p DES-LAD, familial hyperlipidemia, HTN, OSA. LDL is not at goal of <55; was started on repatha  on 6/17 due to statin intolerance. She continues to attend the [facility] education/nutrition series regularly. She has maintained her weight since starting with our program. Patient will benefit from participation in intensive cardiac rehab for nutrition education, exercise, and lifestyle modification.         Nutrition Goals Discharge (Final Nutrition Goals Re-Evaluation):  Nutrition Goals Re-Evaluation - 08/28/23 1116       Goals   Current Weight 156 lb 4.9 oz (70.9 kg)    Comment LPa 230, LDL 71, HDL 46    Expected Outcome Goals in action. Sharon Marsh has medical history of CAD, NSTEMI, s/p DES-LAD, familial hyperlipidemia, HTN, OSA. LDL is not at goal of <55; was started on repatha  on 6/17 due to statin intolerance. She continues to attend the [facility] education/nutrition series regularly. She has maintained her weight since starting with our program. Patient will benefit from participation in intensive cardiac rehab for nutrition education, exercise, and lifestyle modification.          Psychosocial: Target Goals: Acknowledge presence or absence of significant depression and/or stress, maximize coping skills, provide positive support system. Participant is able to verbalize types and ability to  use techniques and skills needed for reducing stress and depression.  Initial Review & Psychosocial Screening:  Initial Psych Review & Screening - 09/11/23 1738       Initial Review   Current issues with None Identified      Family Dynamics   Good Support System? Yes   family and friends     Barriers   Psychosocial barriers to participate in program There  are no identifiable barriers or psychosocial needs.      Screening Interventions   Interventions Encouraged to exercise;Provide feedback about the scores to participant    Expected Outcomes Long Term goal: The participant improves quality of Life and PHQ9 Scores as seen by post scores and/or verbalization of changes;Short Term goal: Identification and review with participant of any Quality of Life or Depression concerns found by scoring the questionnaire.          Quality of Life Scores:  Quality of Life - 07/20/23 1343       Quality of Life   Select Quality of Life      Quality of Life Scores   Health/Function Pre 21.37 %    Socioeconomic Pre 23 %    Psych/Spiritual Pre 24.64 %    Family Pre 22.5 %    GLOBAL Pre 22.55 %         Scores of 19 and below usually indicate a poorer quality of life in these areas.  A difference of  2-3 points is a clinically meaningful difference.  A difference of 2-3 points in the total score of the Quality of Life Index has been associated with significant improvement in overall quality of life, self-image, physical symptoms, and general health in studies assessing change in quality of life.  PHQ-9: Review Flowsheet       07/20/2023  Depression screen PHQ 2/9  Decreased Interest 0  Down, Depressed, Hopeless 0  PHQ - 2 Score 0  Altered sleeping 3  Tired, decreased energy 0  Change in appetite 0  Feeling bad or failure about yourself  0  Trouble concentrating 0  Moving slowly or fidgety/restless 0  Suicidal thoughts 0  PHQ-9 Score 3  Difficult doing work/chores Somewhat difficult   Interpretation of Total Score  Total Score Depression Severity:  1-4 = Minimal depression, 5-9 = Mild depression, 10-14 = Moderate depression, 15-19 = Moderately severe depression, 20-27 = Severe depression   Psychosocial Evaluation and Intervention:   Psychosocial Re-Evaluation:  Psychosocial Re-Evaluation     Row Name 07/25/23 0727 08/15/23 1503            Psychosocial Re-Evaluation   Current issues with Current Sleep Concerns Current Sleep Concerns      Comments Will discuss sleep hygiene in the upcoming week Discussed sleep hygiene. Sharon Marsh says that she has difficulty sleeping at night despite taking sleeping pills.      Interventions Encouraged to attend Cardiac Rehabilitation for the exercise Encouraged to attend Cardiac Rehabilitation for the exercise;Stress management education;Relaxation education      Continue Psychosocial Services  Follow up required by staff Follow up required by staff         Psychosocial Discharge (Final Psychosocial Re-Evaluation):  Psychosocial Re-Evaluation - 08/15/23 1503       Psychosocial Re-Evaluation   Current issues with Current Sleep Concerns    Comments Discussed sleep hygiene. Sharon Marsh says that she has difficulty sleeping at night despite taking sleeping pills.    Interventions Encouraged to attend Cardiac Rehabilitation for the exercise;Stress management education;Relaxation  education    Continue Psychosocial Services  Follow up required by staff          Vocational Rehabilitation: Provide vocational rehab assistance to qualifying candidates.   Vocational Rehab Evaluation & Intervention:  Vocational Rehab - 07/20/23 1155       Initial Vocational Rehab Evaluation & Intervention   Assessment shows need for Vocational Rehabilitation No   retired         Education: Education Goals: Education classes will be provided on a weekly basis, covering required topics. Participant will state understanding/return demonstration of topics presented.    Education     Row Name 07/26/23 1100     Education   Cardiac Education Topics [facility]   Orthoptist   Educator Dietitian   Weekly Topic Tasty Appetizers and Snacks   Instruction Review Code 1- Verbalizes Understanding   Class Start Time 1145   Class Stop Time 1225   Class Time Calculation (min) 40 min    Row  Name 07/28/23 1100     Education   Cardiac Education Topics [facility]   Hospital doctor Education   General Education Heart Disease Risk Reduction   Instruction Review Code 1- Verbalizes Understanding   Class Start Time 1150   Class Stop Time 1240   Class Time Calculation (min) 50 min    Row Name 07/31/23 1300     Education   Cardiac Education Topics [facility]   Nurse, children's Exercise Physiologist   Select Psychosocial   Psychosocial Healthy Minds, Bodies, Hearts   Instruction Review Code 1- Verbalizes Understanding   Class Start Time 1157   Class Stop Time 1233   Class Time Calculation (min) 36 min    Row Name 08/07/23 1500     Education   Cardiac Education Topics [facility]   Glass blower/designer Nutrition   Nutrition Workshop Label Reading   Instruction Review Code 1- Verbalizes Understanding   Class Start Time 1145   Class Stop Time 1230   Class Time Calculation (min) 45 min    Row Name 08/09/23 1000     Education   Cardiac Education Topics [facility]   Customer service manager   Weekly Topic Fast and Healthy Breakfasts   Instruction Review Code 1- Verbalizes Understanding   Class Start Time 1145   Class Stop Time 1220   Class Time Calculation (min) 35 min    Row Name 08/11/23 1000     Education   Cardiac Education Topics Pritikin   Licensed conveyancer Nutrition   Nutrition Other   Instruction Review Code 1- Verbalizes Understanding   Class Start Time 1145   Class Stop Time 1225   Class Time Calculation (min) 40 min    Row Name 08/14/23 1100     Education   Cardiac Education Topics Pritikin   Hospital doctor Education   General Education Metabolic  Syndrome and Belly Fat   Instruction Review Code 1- Verbalizes Understanding   Class Start Time 1200   Class Stop Time 1235  Class Time Calculation (min) 35 min    Row Name 08/16/23 1100     Education   Cardiac Education Topics Pritikin   Customer service manager   Weekly Topic Personalizing Your Pritikin Plate   Instruction Review Code 1- Verbalizes Understanding   Class Start Time 1145   Class Stop Time 1225   Class Time Calculation (min) 40 min    Row Name 08/18/23 1100     Education   Cardiac Education Topics Pritikin   Licensed conveyancer Nutrition   Nutrition Overview of the Pritikin Eating Plan   Instruction Review Code 1- [employer] Understanding    Row Name 08/23/23 1100     Education   Cardiac Education Topics Pritikin   Orthoptist   Educator Dietitian   Weekly Topic Delicious Desserts   Instruction Review Code 1- Verbalizes Understanding   Class Start Time 1145   Class Stop Time 1225   Class Time Calculation (min) 40 min    Row Name 08/25/23 1100     Education   Cardiac Education Topics Pritikin   Licensed conveyancer Nutrition   Nutrition Calorie Density   Instruction Review Code 1- Verbalizes Understanding   Class Start Time 1145   Class Stop Time 1225   Class Time Calculation (min) 40 min    Row Name 08/28/23 1100     Education   Cardiac Education Topics Pritikin   Geographical information systems officer Exercise   Exercise Workshop Exercise Basics: Diplomatic Services operational officer   Instruction Review Code 1- Verbalizes Understanding   Class Start Time 1155   Class Stop Time 1248   Class Time Calculation (min) 53 min    Row Name 08/30/23 1100     Education   Cardiac Education Topics Pritikin   Cabin crew   Weekly Topic Efficiency Cooking - Meals in a Snap   Instruction Review Code 1- Verbalizes Understanding   Class Start Time 1145   Class Stop Time 1230   Class Time Calculation (min) 45 min    Row Name 09/01/23 1000     Education   Cardiac Education Topics Pritikin   Psychologist, forensic Exercise Education   Instruction Review Code 1- Verbalizes Understanding   Class Start Time 1145   Class Stop Time 1224   Class Time Calculation (min) 39 min    Row Name 09/06/23 1100     Education   Cardiac Education Topics Pritikin   Customer service manager   Weekly Topic One-Pot Wonders   Instruction Review Code 1- Verbalizes Understanding   Class Start Time 1146   Class Stop Time 1225   Class Time Calculation (min) 39 min    Row Name 09/08/23 1100     Education   Cardiac Education Topics Pritikin   Engineer, mining Education   General Education Hypertension and Heart Disease   Instruction Review Code 1- [employer]  Understanding   Class Start Time 1153   Class Stop Time 1230   Class Time Calculation (min) 37 min      Core Videos: Exercise    Move It!  Clinical staff conducted group or individual video education with verbal and written material and guidebook.  Patient learns the recommended Pritikin exercise program. Exercise with the goal of living a long, healthy life. Some of the health benefits of exercise include controlled diabetes, healthier blood pressure levels, improved cholesterol levels, improved heart and lung capacity, improved sleep, and better body composition. Everyone should speak with their doctor before starting or changing an exercise routine.  Biomechanical Limitations Clinical staff conducted group or individual video education with verbal and written material and guidebook.  Patient  learns how biomechanical limitations can impact exercise and how we can mitigate and possibly overcome limitations to have an impactful and balanced exercise routine.  Body Composition Clinical staff conducted group or individual video education with verbal and written material and guidebook.  Patient learns that body composition (ratio of muscle mass to fat mass) is a key component to assessing overall fitness, rather than body weight alone. Increased fat mass, especially visceral belly fat, can put us  at increased risk for metabolic syndrome, type 2 diabetes, heart disease, and even death. It is recommended to combine diet and exercise (cardiovascular and resistance training) to improve your body composition. Seek guidance from your physician and exercise physiologist before implementing an exercise routine.  Exercise Action Plan Clinical staff conducted group or individual video education with verbal and written material and guidebook.  Patient learns the recommended strategies to achieve and enjoy long-term exercise adherence, including variety, self-motivation, self-efficacy, and positive decision making. Benefits of exercise include fitness, good health, weight management, more energy, better sleep, less stress, and overall well-being.  Medical   Heart Disease Risk Reduction Clinical staff conducted group or individual video education with verbal and written material and guidebook.  Patient learns our heart is our most vital organ as it circulates oxygen, nutrients, white blood cells, and hormones throughout the entire body, and carries waste away. Data supports a plant-based eating plan like the Pritikin Program for its effectiveness in slowing progression of and reversing heart disease. The video provides a number of recommendations to address heart disease.   Metabolic Syndrome and Belly Fat  Clinical staff conducted group or individual video education with verbal and written material and  guidebook.  Patient learns what metabolic syndrome is, how it leads to heart disease, and how one can reverse it and keep it from coming back. You have metabolic syndrome if you have 3 of the following 5 criteria: abdominal obesity, high blood pressure, high triglycerides, low HDL cholesterol, and high blood sugar.  Hypertension and Heart Disease Clinical staff conducted group or individual video education with verbal and written material and guidebook.  Patient learns that high blood pressure, or hypertension, is very common in the [place] . Hypertension is largely due to excessive salt intake, but other important risk factors include being overweight, physical inactivity, drinking too much alcohol, smoking, and not eating enough potassium from fruits and vegetables. High blood pressure is a leading risk factor for heart attack, stroke, congestive heart failure, dementia, kidney failure, and premature death. Long-term effects of excessive salt intake include stiffening of the arteries and thickening of heart muscle and organ damage. Recommendations include ways to reduce hypertension and the risk of heart disease.  Diseases of Our Time - Focusing on Diabetes Clinical staff  conducted group or individual video education with verbal and written material and guidebook.  Patient learns why the best way to stop diseases of our time is prevention, through food and other lifestyle changes. Medicine (such as prescription pills and surgeries) is often only a Band-Aid on the problem, not a long-term solution. Most common diseases of our time include obesity, type 2 diabetes, hypertension, heart disease, and cancer. The Pritikin Program is recommended and has been proven to help reduce, reverse, and/or prevent the damaging effects of metabolic syndrome.  Nutrition   Overview of the Pritikin Eating Plan  Clinical staff conducted group or individual video education with verbal and written material and  guidebook.  Patient learns about the Pritikin Eating Plan for disease risk reduction. The Pritikin Eating Plan emphasizes a wide variety of unrefined, minimally-processed carbohydrates, like fruits, vegetables, whole grains, and legumes. Go, Caution, and Stop food choices are explained. Plant-based and lean animal proteins are emphasized. Rationale provided for low sodium intake for blood pressure control, low added sugars for blood sugar stabilization, and low added fats and oils for coronary artery disease risk reduction and weight management.  Calorie Density  Clinical staff conducted group or individual video education with verbal and written material and guidebook.  Patient learns about calorie density and how it impacts the Pritikin Eating Plan. Knowing the characteristics of the food you choose will help you decide whether those foods will lead to weight gain or weight loss, and whether you want to consume more or less of them. Weight loss is usually a side effect of the Pritikin Eating Plan because of its focus on low calorie-dense foods.  Label Reading  Clinical staff conducted group or individual video education with verbal and written material and guidebook.  Patient learns about the Pritikin recommended label reading guidelines and corresponding recommendations regarding calorie density, added sugars, sodium content, and whole grains.  Dining Out - Part 1  Clinical staff conducted group or individual video education with verbal and written material and guidebook.  Patient learns that restaurant meals can be sabotaging because they can be so high in calories, fat, sodium, and/or sugar. Patient learns recommended strategies on how to positively address this and avoid unhealthy pitfalls.  Facts on Fats  Clinical staff conducted group or individual video education with verbal and written material and guidebook.  Patient learns that lifestyle modifications can be just as effective, if not  more so, as many medications for lowering your risk of heart disease. A Pritikin lifestyle can help to reduce your risk of inflammation and atherosclerosis (cholesterol build-up, or plaque, in the artery walls). Lifestyle interventions such as dietary choices and physical activity address the cause of atherosclerosis. A review of the types of fats and their impact on blood cholesterol levels, along with dietary recommendations to reduce fat intake is also included.  Nutrition Action Plan  Clinical staff conducted group or individual video education with verbal and written material and guidebook.  Patient learns how to incorporate Pritikin recommendations into their lifestyle. Recommendations include planning and keeping personal health goals in mind as an important part of their success.  Healthy Mind-Set    Healthy Minds, Bodies, Hearts  Clinical staff conducted group or individual video education with verbal and written material and guidebook.  Patient learns how to identify when they are stressed. Video will discuss the impact of that stress, as well as the many benefits of stress management. Patient will also be introduced to stress management techniques. The way we  think, act, and feel has an impact on our hearts.  How Our Thoughts Can Heal Our Hearts  Clinical staff conducted group or individual video education with verbal and written material and guidebook.  Patient learns that negative thoughts can cause depression and anxiety. This can result in negative lifestyle behavior and serious health problems. Cognitive behavioral therapy is an effective method to help control our thoughts in order to change and improve our emotional outlook.  Additional Videos:  Exercise    Improving Performance  Clinical staff conducted group or individual video education with verbal and written material and guidebook.  Patient learns to use a non-linear approach by alternating intensity levels and lengths of  time spent exercising to help burn more calories and lose more body fat. Cardiovascular exercise helps improve heart health, metabolism, hormonal balance, blood sugar control, and recovery from fatigue. Resistance training improves strength, endurance, balance, coordination, reaction time, metabolism, and muscle mass. Flexibility exercise improves circulation, posture, and balance. Seek guidance from your physician and exercise physiologist before implementing an exercise routine and learn your capabilities and proper form for all exercise.  Introduction to Yoga  Clinical staff conducted group or individual video education with verbal and written material and guidebook.  Patient learns about yoga, a discipline of the coming together of mind, breath, and body. The benefits of yoga include improved flexibility, improved range of motion, better posture and core strength, increased lung function, weight loss, and positive self-image. Yoga's heart health benefits include lowered blood pressure, healthier heart rate, decreased cholesterol and triglyceride levels, improved immune function, and reduced stress. Seek guidance from your physician and exercise physiologist before implementing an exercise routine and learn your capabilities and proper form for all exercise.  Medical   Aging: Enhancing Your Quality of Life  Clinical staff conducted group or individual video education with verbal and written material and guidebook.  Patient learns key strategies and recommendations to stay in good physical health and enhance quality of life, such as prevention strategies, having an advocate, securing a Health Care Proxy and Power of Attorney, and keeping a list of medications and system for tracking them. It also discusses how to avoid risk for bone loss.  Biology of Weight Control  Clinical staff conducted group or individual video education with verbal and written material and guidebook.  Patient learns that weight  gain occurs because we consume more calories than we burn (eating more, moving less). Even if your body weight is normal, you may have higher ratios of fat compared to muscle mass. Too much body fat puts you at increased risk for cardiovascular disease, heart attack, stroke, type 2 diabetes, and obesity-related cancers. In addition to exercise, following the Pritikin Eating Plan can help reduce your risk.  Decoding Lab Results  Clinical staff conducted group or individual video education with verbal and written material and guidebook.  Patient learns that lab test reflects one measurement whose values change over time and are influenced by many factors, including medication, stress, sleep, exercise, food, hydration, pre-existing medical conditions, and more. It is recommended to use the knowledge from this video to become more involved with your lab results and evaluate your numbers to speak with your doctor.   Diseases of Our Time - Overview  Clinical staff conducted group or individual video education with verbal and written material and guidebook.  Patient learns that according to the CDC, 50% to 70% of chronic diseases (such as obesity, type 2 diabetes, elevated lipids, hypertension, and heart disease)  are avoidable through lifestyle improvements including healthier food choices, listening to satiety cues, and increased physical activity.  Sleep Disorders Clinical staff conducted group or individual video education with verbal and written material and guidebook.  Patient learns how good quality and duration of sleep are important to overall health and well-being. Patient also learns about sleep disorders and how they impact health along with recommendations to address them, including discussing with a physician.  Nutrition  Dining Out - Part 2 Clinical staff conducted group or individual video education with verbal and written material and guidebook.  Patient learns how to plan ahead and  communicate in order to maximize their dining experience in a healthy and nutritious manner. Included are recommended food choices based on the type of restaurant the patient is visiting.   Fueling a Banker conducted group or individual video education with verbal and written material and guidebook.  There is a strong connection between our food choices and our health. Diseases like obesity and type 2 diabetes are very prevalent and are in large-part due to lifestyle choices. The Pritikin Eating Plan provides plenty of food and hunger-curbing satisfaction. It is easy to follow, affordable, and helps reduce health risks.  Menu Workshop  Clinical staff conducted group or individual video education with verbal and written material and guidebook.  Patient learns that restaurant meals can sabotage health goals because they are often packed with calories, fat, sodium, and sugar. Recommendations include strategies to plan ahead and to communicate with the manager, chef, or server to help order a healthier meal.  Planning Your Eating Strategy  Clinical staff conducted group or individual video education with verbal and written material and guidebook.  Patient learns about the Pritikin Eating Plan and its benefit of reducing the risk of disease. The Pritikin Eating Plan does not focus on calories. Instead, it emphasizes high-quality, nutrient-rich foods. By knowing the characteristics of the foods, we choose, we can determine their calorie density and make informed decisions.  Targeting Your Nutrition Priorities  Clinical staff conducted group or individual video education with verbal and written material and guidebook.  Patient learns that lifestyle habits have a tremendous impact on disease risk and progression. This video provides eating and physical activity recommendations based on your personal health goals, such as reducing LDL cholesterol, losing weight, preventing or controlling  type 2 diabetes, and reducing high blood pressure.  Vitamins and Minerals  Clinical staff conducted group or individual video education with verbal and written material and guidebook.  Patient learns different ways to obtain key vitamins and minerals, including through a recommended healthy diet. It is important to discuss all supplements you take with your doctor.   Healthy Mind-Set    Smoking Cessation  Clinical staff conducted group or individual video education with verbal and written material and guidebook.  Patient learns that cigarette smoking and tobacco addiction pose a serious health risk which affects millions of people. Stopping smoking will significantly reduce the risk of heart disease, lung disease, and many forms of cancer. Recommended strategies for quitting are covered, including working with your doctor to develop a successful plan.  Culinary   Becoming a Set designer conducted group or individual video education with verbal and written material and guidebook.  Patient learns that cooking at home can be healthy, cost-effective, quick, and puts them in control. Keys to cooking healthy recipes will include looking at your recipe, assessing your equipment needs, planning ahead, making it simple, choosing  cost-effective seasonal ingredients, and limiting the use of added fats, salts, and sugars.  Cooking - Breakfast and Snacks  Clinical staff conducted group or individual video education with verbal and written material and guidebook.  Patient learns how important breakfast is to satiety and nutrition through the entire day. Recommendations include key foods to eat during breakfast to help stabilize blood sugar levels and to prevent overeating at meals later in the day. Planning ahead is also a key component.  Cooking - Educational psychologist conducted group or individual video education with verbal and written material and guidebook.  Patient learns  eating strategies to improve overall health, including an approach to cook more at home. Recommendations include thinking of animal protein as a side on your plate rather than center stage and focusing instead on lower calorie dense options like vegetables, fruits, whole grains, and plant-based proteins, such as beans. Making sauces in large quantities to freeze for later and leaving the skin on your vegetables are also recommended to maximize your experience.  Cooking - Healthy Salads and Dressing Clinical staff conducted group or individual video education with verbal and written material and guidebook.  Patient learns that vegetables, fruits, whole grains, and legumes are the foundations of the Pritikin Eating Plan. Recommendations include how to incorporate each of these in flavorful and healthy salads, and how to create homemade salad dressings. Proper handling of ingredients is also covered. Cooking - Soups and [employer] - Soups and Desserts Clinical staff conducted group or individual video education with verbal and written material and guidebook.  Patient learns that Pritikin soups and desserts make for easy, nutritious, and delicious snacks and meal components that are low in sodium, fat, sugar, and calorie density, while high in vitamins, minerals, and filling fiber. Recommendations include simple and healthy ideas for soups and desserts.   Overview     The Pritikin Solution Program Overview Clinical staff conducted group or individual video education with verbal and written material and guidebook.  Patient learns that the results of the Pritikin Program have been documented in more than 100 articles published in peer-reviewed journals, and the benefits include reducing risk factors for (and, in some cases, even reversing) high cholesterol, high blood pressure, type 2 diabetes, obesity, and more! An overview of the three key pillars of the Pritikin Program will be covered: eating well,  doing regular exercise, and having a healthy mind-set.  WORKSHOPS  Exercise: Exercise Basics: Building Your Action Plan Clinical staff led group instruction and group discussion with PowerPoint presentation and patient guidebook. To enhance the learning environment the use of posters, models and videos may be added. At the conclusion of this workshop, patients will comprehend the difference between physical activity and exercise, as well as the benefits of incorporating both, into their routine. Patients will understand the FITT (Frequency, Intensity, Time, and Type) principle and how to use it to build an exercise action plan. In addition, safety concerns and other considerations for exercise and cardiac rehab will be addressed by the presenter. The purpose of this lesson is to promote a comprehensive and effective weekly exercise routine in order to improve patients' overall level of fitness.   Managing Heart Disease: Your Path to a Healthier Heart Clinical staff led group instruction and group discussion with PowerPoint presentation and patient guidebook. To enhance the learning environment the use of posters, models and videos may be added.At the conclusion of this workshop, patients will understand the anatomy and physiology of  the heart. Additionally, they will understand how Pritikin's three pillars impact the risk factors, the progression, and the management of heart disease.  The purpose of this lesson is to provide a high-level overview of the heart, heart disease, and how the Pritikin lifestyle positively impacts risk factors.  Exercise Biomechanics Clinical staff led group instruction and group discussion with PowerPoint presentation and patient guidebook. To enhance the learning environment the use of posters, models and videos may be added. Patients will learn how the structural parts of their bodies function and how these functions impact their daily activities, movement, and  exercise. Patients will learn how to promote a neutral spine, learn how to manage pain, and identify ways to improve their physical movement in order to promote healthy living. The purpose of this lesson is to expose patients to common physical limitations that impact physical activity. Participants will learn practical ways to adapt and manage aches and pains, and to minimize their effect on regular exercise. Patients will learn how to maintain good posture while sitting, walking, and lifting.  Balance Training and Fall Prevention  Clinical staff led group instruction and group discussion with PowerPoint presentation and patient guidebook. To enhance the learning environment the use of posters, models and videos may be added. At the conclusion of this workshop, patients will understand the importance of their sensorimotor skills (vision, proprioception, and the vestibular system) in maintaining their ability to balance as they age. Patients will apply a variety of balancing exercises that are appropriate for their current level of function. Patients will understand the common causes for poor balance, possible solutions to these problems, and ways to modify their physical environment in order to minimize their fall risk. The purpose of this lesson is to teach patients about the importance of maintaining balance as they age and ways to minimize their risk of falling.  WORKSHOPS   Nutrition:  Fueling a Ship broker led group instruction and group discussion with PowerPoint presentation and patient guidebook. To enhance the learning environment the use of posters, models and videos may be added. Patients will review the foundational principles of the Pritikin Eating Plan and understand what constitutes a serving size in each of the food groups. Patients will also learn Pritikin-friendly foods that are better choices when away from home and review make-ahead meal and snack options.  Calorie density will be reviewed and applied to three nutrition priorities: weight maintenance, weight loss, and weight gain. The purpose of this lesson is to reinforce (in a group setting) the key concepts around what patients are recommended to eat and how to apply these guidelines when away from home by planning and selecting Pritikin-friendly options. Patients will understand how calorie density may be adjusted for different weight management goals.  Mindful Eating  Clinical staff led group instruction and group discussion with PowerPoint presentation and patient guidebook. To enhance the learning environment the use of posters, models and videos may be added. Patients will briefly review the concepts of the Pritikin Eating Plan and the importance of low-calorie dense foods. The concept of mindful eating will be introduced as well as the importance of paying attention to internal hunger signals. Triggers for non-hunger eating and techniques for dealing with triggers will be explored. The purpose of this lesson is to provide patients with the opportunity to review the basic principles of the Pritikin Eating Plan, discuss the value of eating mindfully and how to measure internal cues of hunger and fullness using the Hunger Scale. Patients  will also discuss reasons for non-hunger eating and learn strategies to use for controlling emotional eating.  Targeting Your Nutrition Priorities Clinical staff led group instruction and group discussion with PowerPoint presentation and patient guidebook. To enhance the learning environment the use of posters, models and videos may be added. Patients will learn how to determine their genetic susceptibility to disease by reviewing their family history. Patients will gain insight into the importance of diet as part of an overall healthy lifestyle in mitigating the impact of genetics and other environmental insults. The purpose of this lesson is to provide patients with the  opportunity to assess their personal nutrition priorities by looking at their family history, their own health history and current risk factors. Patients will also be able to discuss ways of prioritizing and modifying the Pritikin Eating Plan for their highest risk areas  Menu  Clinical staff led group instruction and group discussion with PowerPoint presentation and patient guidebook. To enhance the learning environment the use of posters, models and videos may be added. Using menus brought in from [employer], or printed from [employer], patients will apply the Pritikin dining out guidelines that were presented in the [employer] video. Patients will also be able to practice these guidelines in a variety of provided scenarios. The purpose of this lesson is to provide patients with the opportunity to practice hands-on learning of the Pritikin Dining Out guidelines with actual menus and practice scenarios.  Label Reading Clinical staff led group instruction and group discussion with PowerPoint presentation and patient guidebook. To enhance the learning environment the use of posters, models and videos may be added. Patients will review and discuss the Pritikin label reading guidelines presented in Pritikin's Label Reading Educational series video. Using fool labels brought in from local grocery stores and markets, patients will apply the label reading guidelines and determine if the packaged food meet the Pritikin guidelines. The purpose of this lesson is to provide patients with the opportunity to review, discuss, and practice hands-on learning of the Pritikin Label Reading guidelines with actual packaged food labels. Cooking School  Pritikin's [employer] are designed to teach patients ways to prepare quick, simple, and affordable recipes at home. The importance of nutrition's role in chronic disease risk reduction is reflected in its emphasis in the overall  Pritikin program. By learning how to prepare essential core Pritikin Eating Plan recipes, patients will increase control over what they eat; be able to customize the flavor of foods without the use of added salt, sugar, or fat; and improve the quality of the food they consume. By learning a set of core recipes which are easily assembled, quickly prepared, and affordable, patients are more likely to prepare more healthy foods at home. These workshops focus on convenient breakfasts, simple entres, side dishes, and desserts which can be prepared with minimal effort and are consistent with nutrition recommendations for cardiovascular risk reduction. Cooking [employer] are taught by a Armed forces logistics/support/administrative officer (RD) who has been trained by the [employer]. The chef or RD has a clear understanding of the importance of minimizing - if not completely eliminating - added fat, sugar, and sodium in recipes. Throughout the series of Cooking School Workshop sessions, patients will learn about healthy ingredients and efficient methods of cooking to build confidence in their capability to prepare    Cooking School weekly topics:  Adding Flavor- Sodium-Free  Fast and Healthy Breakfasts  Powerhouse Plant-Based Proteins  Satisfying Salads  and Dressings  Simple Sides and Sauces  International Cuisine-Spotlight on the [employer] Zones  Delicious Desserts  Savory Soups  [employer] - Meals in a Snap  Tasty Appetizers and Snacks  Comforting Weekend Breakfasts  One-Pot Wonders   Fast Evening Meals  Landscape architect Your Pritikin Plate  WORKSHOPS   Healthy Mindset (Psychosocial):  Focused Goals, Sustainable Changes Clinical staff led group instruction and group discussion with PowerPoint presentation and patient guidebook. To enhance the learning environment the use of posters, models and videos may be added. Patients will be able to apply effective goal setting strategies  to establish at least one personal goal, and then take consistent, meaningful action toward that goal. They will learn to identify common barriers to achieving personal goals and develop strategies to overcome them. Patients will also gain an understanding of how our mind-set can impact our ability to achieve goals and the importance of cultivating a positive and growth-oriented mind-set. The purpose of this lesson is to provide patients with a deeper understanding of how to set and achieve personal goals, as well as the tools and strategies needed to overcome common obstacles which may arise along the way.  From Head to Heart: The Power of a Healthy Outlook  Clinical staff led group instruction and group discussion with PowerPoint presentation and patient guidebook. To enhance the learning environment the use of posters, models and videos may be added. Patients will be able to recognize and describe the impact of emotions and mood on physical health. They will discover the importance of self-care and explore self-care practices which may work for them. Patients will also learn how to utilize the 4 C's to cultivate a healthier outlook and better manage stress and challenges. The purpose of this lesson is to demonstrate to patients how a healthy outlook is an essential part of maintaining good health, especially as they continue their cardiac rehab journey.  Healthy Sleep for a Healthy Heart Clinical staff led group instruction and group discussion with PowerPoint presentation and patient guidebook. To enhance the learning environment the use of posters, models and videos may be added. At the conclusion of this workshop, patients will be able to demonstrate knowledge of the importance of sleep to overall health, well-being, and quality of life. They will understand the symptoms of, and treatments for, common sleep disorders. Patients will also be able to identify daytime and nighttime behaviors which impact  sleep, and they will be able to apply these tools to help manage sleep-related challenges. The purpose of this lesson is to provide patients with a general overview of sleep and outline the importance of quality sleep. Patients will learn about a few of the most common sleep disorders. Patients will also be introduced to the concept of "sleep hygiene," and discover ways to self-manage certain sleeping problems through simple daily behavior changes. Finally, the workshop will motivate patients by clarifying the links between quality sleep and their goals of heart-healthy living.   Recognizing and Reducing Stress Clinical staff led group instruction and group discussion with PowerPoint presentation and patient guidebook. To enhance the learning environment the use of posters, models and videos may be added. At the conclusion of this workshop, patients will be able to understand the types of stress reactions, differentiate between acute and chronic stress, and recognize the impact that chronic stress has on their health. They will also be able to apply different coping mechanisms, such as reframing negative self-talk. Patients will have the opportunity to practice  a variety of stress management techniques, such as deep abdominal breathing, progressive muscle relaxation, and/or guided imagery.  The purpose of this lesson is to educate patients on the role of stress in their lives and to provide healthy techniques for coping with it.  Learning Barriers/Preferences:  Learning Barriers/Preferences - 07/20/23 1155       Learning Barriers/Preferences   Learning Barriers None    Learning Preferences Audio;Computer/Internet;Group Instruction;Individual Instruction;Skilled Demonstration;Verbal Instruction;Written Material;Video;Pictoral          Education Topics:  Knowledge Questionnaire Score:  Knowledge Questionnaire Score - 07/20/23 1155       Knowledge Questionnaire Score   Pre Score 19/24           Core Components/Risk Factors/Patient Goals at Admission:  Personal Goals and Risk Factors at Admission - 07/20/23 1156       Core Components/Risk Factors/Patient Goals on Admission    Weight Management Yes    Intervention Weight Management: Develop a combined nutrition and exercise program designed to reach desired caloric intake, while maintaining appropriate intake of nutrient and fiber, sodium and fats, and appropriate energy expenditure required for the weight goal.;Weight Management: Provide education and appropriate resources to help participant work on and attain dietary goals.    Expected Outcomes Short Term: Continue to assess and modify interventions until short term weight is achieved;Long Term: Adherence to nutrition and physical activity/exercise program aimed toward attainment of established weight goal;Understanding recommendations for meals to include 15-35% energy as protein, 25-35% energy from fat, 35-60% energy from carbohydrates, less than 200mg  of dietary cholesterol, 20-35 gm of total fiber daily;Understanding of distribution of calorie intake throughout the day with the consumption of 4-5 meals/snacks    Hypertension Yes    Intervention Provide education on lifestyle modifcations including regular physical activity/exercise, weight management, moderate sodium restriction and increased consumption of fresh fruit, vegetables, and low fat dairy, alcohol moderation, and smoking cessation.;Monitor prescription use compliance.    Expected Outcomes Short Term: Continued assessment and intervention until BP is < 140/28mm HG in hypertensive participants. < 130/73mm HG in hypertensive participants with diabetes, heart failure or chronic kidney disease.;Long Term: Maintenance of blood pressure at goal levels.    Lipids Yes    Intervention Provide education and support for participant on nutrition & aerobic/resistive exercise along with prescribed medications to achieve LDL 70mg , HDL  >40mg .    Expected Outcomes Long Term: Cholesterol controlled with medications as prescribed, with individualized exercise RX and with personalized nutrition plan. Value goals: LDL < 70mg , HDL > 40 mg.;Short Term: Participant states understanding of desired cholesterol values and is compliant with medications prescribed. Participant is following exercise prescription and nutrition guidelines.    Stress Yes          Core Components/Risk Factors/Patient Goals Review:   Goals and Risk Factor Review     Row Name 07/25/23 0729 08/15/23 1506 09/11/23 1738         Core Components/Risk Factors/Patient Goals Review   Personal Goals Review Weight Management/Obesity;Hypertension;Lipids Weight Management/Obesity;Hypertension;Lipids Weight Management/Obesity;Hypertension;Lipids     Review Sharon Marsh started cardiac rehab on 07/24/23. Sharon Marsh did well with exercise. Vital signs were stable. Sharon Marsh is doing well with exercise at cardiac rehab. . Vital signs have been stable. Sharon Marsh continues to do well with exercise at cardiac rehab. . Vital signs remain stable.     Expected Outcomes Sharon Marsh wil continue to participate in cardiac rehab for exercise, nutrition and lifestyle modifications Sharon Marsh wil continue to participate in cardiac rehab for exercise, nutrition  and lifestyle modifications Sharon Marsh wil continue to participate in cardiac rehab for exercise, nutrition and lifestyle modifications        Core Components/Risk Factors/Patient Goals at Discharge (Final Review):   Goals and Risk Factor Review - 09/11/23 1738       Core Components/Risk Factors/Patient Goals Review   Personal Goals Review Weight Management/Obesity;Hypertension;Lipids    Review Sharon Marsh continues to do well with exercise at cardiac rehab. . Vital signs remain stable.    Expected Outcomes Sharon Marsh wil continue to participate in cardiac rehab for exercise, nutrition and lifestyle modifications          ITP Comments:  ITP Comments     Row Name 07/20/23 1130  07/25/23 0726 08/15/23 1502 09/11/23 1737     ITP Comments Dr. Wilbert Bihari medical director. Introduction to pritikin education/intensive cardiac rehab. Initial orientation packet reviewed with patient. 30 Day ITP Review. Sharon Marsh started cardiac rehab on 07/25/23. Sharon Marsh did well with exercise. 30 Day ITP Review. Sharon Marsh  has good attendance and participation with exercise at cardiac rehab 30 Day ITP Review. Sharon Marsh  continues to have  good attendance and participation with exercise at cardiac rehab       Comments: See ITP Comments

## 2023-09-12 ENCOUNTER — Ambulatory Visit (HOSPITAL_BASED_OUTPATIENT_CLINIC_OR_DEPARTMENT_OTHER): Payer: Self-pay | Admitting: Family

## 2023-09-12 DIAGNOSIS — I6522 Occlusion and stenosis of left carotid artery: Secondary | ICD-10-CM

## 2023-09-12 DIAGNOSIS — Z8673 Personal history of transient ischemic attack (TIA), and cerebral infarction without residual deficits: Secondary | ICD-10-CM

## 2023-09-12 DIAGNOSIS — I25118 Atherosclerotic heart disease of native coronary artery with other forms of angina pectoris: Secondary | ICD-10-CM

## 2023-09-12 NOTE — Telephone Encounter (Signed)
-----   Message from Reche GORMAN Finder sent at 09/12/2023  1:23 PM EDT ----- Mild left carotid stenosis.  Not enough to cause any symptoms.  Continue aspirin , Plavix , Repatha  to prevent progression.  Recommend repeat carotid duplex in 1 year for monitoring. ----- Message ----- From: Interface, [employer] Sent: 09/11/2023   3:37 PM EDT To: Reche GORMAN Finder, NP

## 2023-09-12 NOTE — Telephone Encounter (Signed)
 Results were sent to the pts active mychart account by Reche Finder, NP.   Will go ahead and place the repeat carotids in one year in the system and have our Scheduling dept reach out to her closer to that time frame to arrange this appt.   Will monitor for pt to review in mychart.

## 2023-09-13 ENCOUNTER — Encounter (HOSPITAL_COMMUNITY)
Admission: RE | Admit: 2023-09-13 | Discharge: 2023-09-13 | Disposition: A | Discharge status: Home / Self Care | Source: Ambulatory Visit | Attending: Cardiology | Admitting: Cardiology

## 2023-09-13 DIAGNOSIS — Z955 Presence of coronary angioplasty implant and graft: Secondary | ICD-10-CM

## 2023-09-13 DIAGNOSIS — Z48812 Encounter for surgical aftercare following surgery on the circulatory system: Secondary | ICD-10-CM | POA: Diagnosis not present

## 2023-09-13 DIAGNOSIS — I252 Old myocardial infarction: Secondary | ICD-10-CM | POA: Diagnosis not present

## 2023-09-13 DIAGNOSIS — I214 Non-ST elevation (NSTEMI) myocardial infarction: Secondary | ICD-10-CM

## 2023-09-14 ENCOUNTER — Other Ambulatory Visit (HOSPITAL_COMMUNITY): Payer: Self-pay

## 2023-09-15 ENCOUNTER — Encounter (HOSPITAL_COMMUNITY)
Admission: RE | Admit: 2023-09-15 | Discharge: 2023-09-15 | Disposition: A | Discharge status: Home / Self Care | Source: Ambulatory Visit | Attending: Cardiology | Admitting: Cardiology

## 2023-09-15 DIAGNOSIS — I252 Old myocardial infarction: Secondary | ICD-10-CM | POA: Diagnosis not present

## 2023-09-15 DIAGNOSIS — Z48812 Encounter for surgical aftercare following surgery on the circulatory system: Secondary | ICD-10-CM | POA: Diagnosis not present

## 2023-09-15 DIAGNOSIS — Z955 Presence of coronary angioplasty implant and graft: Secondary | ICD-10-CM | POA: Diagnosis not present

## 2023-09-15 DIAGNOSIS — I214 Non-ST elevation (NSTEMI) myocardial infarction: Secondary | ICD-10-CM

## 2023-09-18 ENCOUNTER — Encounter (HOSPITAL_COMMUNITY)
Admission: RE | Admit: 2023-09-18 | Discharge: 2023-09-18 | Disposition: A | Discharge status: Home / Self Care | Source: Ambulatory Visit | Attending: Cardiology

## 2023-09-18 DIAGNOSIS — Z955 Presence of coronary angioplasty implant and graft: Secondary | ICD-10-CM

## 2023-09-18 DIAGNOSIS — Z48812 Encounter for surgical aftercare following surgery on the circulatory system: Secondary | ICD-10-CM | POA: Diagnosis not present

## 2023-09-18 DIAGNOSIS — I214 Non-ST elevation (NSTEMI) myocardial infarction: Secondary | ICD-10-CM

## 2023-09-18 DIAGNOSIS — I252 Old myocardial infarction: Secondary | ICD-10-CM | POA: Diagnosis not present

## 2023-09-19 DIAGNOSIS — R002 Palpitations: Secondary | ICD-10-CM | POA: Diagnosis not present

## 2023-09-19 DIAGNOSIS — G4730 Sleep apnea, unspecified: Secondary | ICD-10-CM | POA: Diagnosis not present

## 2023-09-19 DIAGNOSIS — I2510 Atherosclerotic heart disease of native coronary artery without angina pectoris: Secondary | ICD-10-CM | POA: Diagnosis not present

## 2023-09-19 DIAGNOSIS — I10 Essential (primary) hypertension: Secondary | ICD-10-CM | POA: Diagnosis not present

## 2023-09-19 DIAGNOSIS — G4700 Insomnia, unspecified: Secondary | ICD-10-CM | POA: Diagnosis not present

## 2023-09-19 DIAGNOSIS — I471 Supraventricular tachycardia, unspecified: Secondary | ICD-10-CM | POA: Diagnosis not present

## 2023-09-19 DIAGNOSIS — M1990 Unspecified osteoarthritis, unspecified site: Secondary | ICD-10-CM | POA: Diagnosis not present

## 2023-09-19 DIAGNOSIS — N390 Urinary tract infection, site not specified: Secondary | ICD-10-CM | POA: Diagnosis not present

## 2023-09-19 DIAGNOSIS — E7849 Other hyperlipidemia: Secondary | ICD-10-CM | POA: Diagnosis not present

## 2023-09-20 ENCOUNTER — Encounter (HOSPITAL_COMMUNITY)
Admission: RE | Admit: 2023-09-20 | Discharge: 2023-09-20 | Disposition: A | Discharge status: Home / Self Care | Source: Ambulatory Visit | Attending: Cardiology | Admitting: Cardiology

## 2023-09-20 DIAGNOSIS — I214 Non-ST elevation (NSTEMI) myocardial infarction: Secondary | ICD-10-CM

## 2023-09-20 DIAGNOSIS — I252 Old myocardial infarction: Secondary | ICD-10-CM | POA: Diagnosis not present

## 2023-09-20 DIAGNOSIS — Z48812 Encounter for surgical aftercare following surgery on the circulatory system: Secondary | ICD-10-CM | POA: Diagnosis not present

## 2023-09-20 DIAGNOSIS — Z955 Presence of coronary angioplasty implant and graft: Secondary | ICD-10-CM | POA: Diagnosis not present

## 2023-09-22 ENCOUNTER — Encounter (HOSPITAL_COMMUNITY)
Admission: RE | Admit: 2023-09-22 | Discharge: 2023-09-22 | Disposition: A | Discharge status: Home / Self Care | Source: Ambulatory Visit | Attending: Cardiology | Admitting: Cardiology

## 2023-09-22 DIAGNOSIS — I214 Non-ST elevation (NSTEMI) myocardial infarction: Secondary | ICD-10-CM

## 2023-09-22 DIAGNOSIS — Z955 Presence of coronary angioplasty implant and graft: Secondary | ICD-10-CM

## 2023-09-22 DIAGNOSIS — Z48812 Encounter for surgical aftercare following surgery on the circulatory system: Secondary | ICD-10-CM | POA: Diagnosis not present

## 2023-09-22 DIAGNOSIS — I252 Old myocardial infarction: Secondary | ICD-10-CM | POA: Diagnosis not present

## 2023-09-25 ENCOUNTER — Encounter (HOSPITAL_COMMUNITY)
Admission: RE | Admit: 2023-09-25 | Discharge: 2023-09-25 | Disposition: A | Discharge status: Home / Self Care | Source: Ambulatory Visit | Attending: Cardiology

## 2023-09-25 DIAGNOSIS — Z955 Presence of coronary angioplasty implant and graft: Secondary | ICD-10-CM

## 2023-09-25 DIAGNOSIS — I214 Non-ST elevation (NSTEMI) myocardial infarction: Secondary | ICD-10-CM

## 2023-09-25 DIAGNOSIS — I252 Old myocardial infarction: Secondary | ICD-10-CM | POA: Diagnosis not present

## 2023-09-25 DIAGNOSIS — Z48812 Encounter for surgical aftercare following surgery on the circulatory system: Secondary | ICD-10-CM | POA: Diagnosis not present

## 2023-09-27 ENCOUNTER — Encounter (HOSPITAL_COMMUNITY)
Admission: RE | Admit: 2023-09-27 | Discharge: 2023-09-27 | Disposition: A | Discharge status: Home / Self Care | Source: Ambulatory Visit | Attending: Cardiology | Admitting: Cardiology

## 2023-09-27 DIAGNOSIS — Z48812 Encounter for surgical aftercare following surgery on the circulatory system: Secondary | ICD-10-CM | POA: Diagnosis not present

## 2023-09-27 DIAGNOSIS — Z955 Presence of coronary angioplasty implant and graft: Secondary | ICD-10-CM | POA: Diagnosis not present

## 2023-09-27 DIAGNOSIS — I252 Old myocardial infarction: Secondary | ICD-10-CM | POA: Diagnosis not present

## 2023-09-27 DIAGNOSIS — I214 Non-ST elevation (NSTEMI) myocardial infarction: Secondary | ICD-10-CM

## 2023-09-29 ENCOUNTER — Encounter (HOSPITAL_COMMUNITY): Admission: RE | Admit: 2023-09-29 | Source: Ambulatory Visit

## 2023-10-02 ENCOUNTER — Encounter (HOSPITAL_COMMUNITY)
Admission: RE | Admit: 2023-10-02 | Discharge: 2023-10-02 | Disposition: A | Source: Ambulatory Visit | Attending: Cardiology

## 2023-10-02 DIAGNOSIS — I252 Old myocardial infarction: Secondary | ICD-10-CM | POA: Diagnosis not present

## 2023-10-02 DIAGNOSIS — I214 Non-ST elevation (NSTEMI) myocardial infarction: Secondary | ICD-10-CM

## 2023-10-02 DIAGNOSIS — Z955 Presence of coronary angioplasty implant and graft: Secondary | ICD-10-CM | POA: Diagnosis not present

## 2023-10-02 DIAGNOSIS — Z48812 Encounter for surgical aftercare following surgery on the circulatory system: Secondary | ICD-10-CM | POA: Diagnosis not present

## 2023-10-04 ENCOUNTER — Encounter (HOSPITAL_COMMUNITY)
Admission: RE | Admit: 2023-10-04 | Discharge: 2023-10-04 | Disposition: A | Discharge status: Home / Self Care | Source: Ambulatory Visit | Attending: Cardiology | Admitting: Cardiology

## 2023-10-04 DIAGNOSIS — Z955 Presence of coronary angioplasty implant and graft: Secondary | ICD-10-CM | POA: Insufficient documentation

## 2023-10-04 DIAGNOSIS — I214 Non-ST elevation (NSTEMI) myocardial infarction: Secondary | ICD-10-CM | POA: Diagnosis present

## 2023-10-04 DIAGNOSIS — M5136 Other intervertebral disc degeneration, lumbar region with discogenic back pain only: Secondary | ICD-10-CM | POA: Diagnosis not present

## 2023-10-04 DIAGNOSIS — M9903 Segmental and somatic dysfunction of lumbar region: Secondary | ICD-10-CM | POA: Diagnosis not present

## 2023-10-06 ENCOUNTER — Encounter (HOSPITAL_COMMUNITY)
Admission: RE | Admit: 2023-10-06 | Discharge: 2023-10-06 | Disposition: A | Discharge status: Home / Self Care | Source: Ambulatory Visit | Attending: Cardiology | Admitting: Cardiology

## 2023-10-06 DIAGNOSIS — Z955 Presence of coronary angioplasty implant and graft: Secondary | ICD-10-CM

## 2023-10-06 DIAGNOSIS — I214 Non-ST elevation (NSTEMI) myocardial infarction: Secondary | ICD-10-CM

## 2023-10-09 ENCOUNTER — Encounter (HOSPITAL_COMMUNITY)
Admission: RE | Admit: 2023-10-09 | Discharge: 2023-10-09 | Disposition: A | Discharge status: Home / Self Care | Source: Ambulatory Visit | Attending: Cardiology | Admitting: Cardiology

## 2023-10-09 DIAGNOSIS — Z955 Presence of coronary angioplasty implant and graft: Secondary | ICD-10-CM

## 2023-10-09 DIAGNOSIS — I214 Non-ST elevation (NSTEMI) myocardial infarction: Secondary | ICD-10-CM | POA: Diagnosis not present

## 2023-10-10 NOTE — Progress Notes (Signed)
 Cardiac Individual Treatment Plan  Patient Details  Name: Sharon Marsh MRN: [id number] Date of Birth: 1945/03/25 Referring Provider:   Flowsheet Row INTENSIVE CARDIAC Marsh ORIENT from 07/20/2023 in [facility]  Referring Provider Shelda Grizzle, MD    Initial Encounter Date:  Flowsheet Row INTENSIVE CARDIAC Marsh ORIENT from 07/20/2023 in [facility]  Date 07/20/23    Visit Diagnosis: 05/16/23 Sharon Marsh (non-ST elevated myocardial infarction) (HCC)  06/06/23 DES LAD  Patient's Home Medications on Admission:  Current Outpatient Medications:    ACETYLCYSTEINE PO, Take 1 each by mouth daily. Also known as NAC (pt knows as this) - Take 1 capsules by mouth once daily. May also take extra if needed., Disp: , Rfl:    Ascorbic Acid 1000 MG CHEW, Chew 1,000 mg by mouth daily., Disp: , Rfl:    aspirin  EC 81 MG tablet, Take 1 tablet (81 mg total) by mouth daily. Swallow whole., Disp: 150 tablet, Rfl: 2   b complex vitamins tablet, Take 1 tablet by mouth daily., Disp: , Rfl:    cholecalciferol (VITAMIN D3) 25 MCG (1000 UNIT) tablet, Take 1,000 Units by mouth daily., Disp: , Rfl:    clopidogrel  (PLAVIX ) 75 MG tablet, Take 1 tablet (75 mg total) by mouth daily., Disp: 90 tablet, Rfl: 3   Coenzyme Q10 200 MG capsule, Take by mouth daily., Disp: , Rfl:    Collagen Hydrolysate POWD, 1 Scoop by Does not apply route daily., Disp: , Rfl:    Evolocumab  (REPATHA  SURECLICK) 140 MG/ML SOAJ, Inject 140 mg into the skin every 14 (fourteen) days., Disp: 2 mL, Rfl: 6   MAGNESIUM PO, Take 2 each by mouth at bedtime. Take 2 gummies by mouth every night, Disp: , Rfl:    metoprolol  succinate (TOPROL  XL) 50 MG 24 hr tablet, Take 1 tablet (50 mg total) by mouth daily. Take with or immediately following a meal., Disp: 90 tablet, Rfl: 3   nitroGLYCERIN  (NITROSTAT ) 0.4 MG SL tablet, Place 1 tablet  (0.4 mg total) under the tongue every 5 (five) minutes as needed for chest pain., Disp: 25 tablet, Rfl: 3   pantoprazole  (PROTONIX ) 20 MG tablet, Take 1 tablet (20 mg total) by mouth daily., Disp: 90 tablet, Rfl: 3   zolpidem (AMBIEN) 10 MG tablet, Take 10 mg by mouth at bedtime., Disp: , Rfl:   Past Medical History: Past Medical History:  Diagnosis Date   Anxiety state, unspecified 08/15/2013   Bulging lumbar disc    Cardiac murmur 07/02/2019   Chest discomfort 07/02/2019   GERD (gastroesophageal reflux disease)    Hiatal hernia    Marsh 08/15/2013   Hypertension    Insomnia    Lumbar spondylolysis    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   Obstructive sleep apnea    Osteoarthritis of left knee 08/09/2017   Osteoarthritis of right knee 10/03/2018   Osteoporosis 02/12/2019   Other and unspecified Marsh    Personal history of vulvar dysplasia    PONV (postoperative nausea and vomiting)    Unspecified essential hypertension 08/15/2013   Unspecified hereditary and idiopathic peripheral neuropathy 08/15/2013    Tobacco Use: Social History   Tobacco Use  Smoking Status Former  Smokeless Tobacco Never    Labs: Review Flowsheet       Latest Ref Rng & Units 10/29/2019 06/07/2023 08/17/2023  Labs for ITP Cardiac and Pulmonary Marsh  Cholestrol 100 - 199 mg/dL -  222  139   LDL (calc) 0 - 99 mg/dL - 862  71   HDL-C >60 mg/dL - 48  46   Trlycerides 0 - 149 mg/dL - 813  876   TCO2 22 - 32 mmol/L 23  - -     Exercise Target Goals: Exercise Marsh Goal: Individual exercise prescription set using results from initial 6 min walk test and THRR while considering  patient's activity barriers and safety.   Exercise Prescription Goal: Initial exercise prescription builds to 30-45 minutes a day of aerobic activity, 2-3 days per week.  Home exercise guidelines will be given to patient during Marsh as part of exercise prescription that the participant will acknowledge.   Education:  Aerobic Exercise: - Group verbal and visual presentation on the components of exercise prescription. Introduces F.I.T.T principle from ACSM for exercise prescriptions.  Reviews F.I.T.T. principles of aerobic exercise including progression. Written material provided at class time.   Education: Resistance Exercise: - Group verbal and visual presentation on the components of exercise prescription. Introduces F.I.T.T principle from ACSM for exercise prescriptions  Reviews F.I.T.T. principles of resistance exercise including progression. Written material provided at class time.    Education: Exercise & Equipment Safety: - Individual verbal instruction and demonstration of equipment use and safety with use of the equipment.   Education: Exercise Physiology & General Exercise Guidelines: - Group verbal and written instruction with models to review the exercise physiology of the cardiovascular system and associated critical values. Provides general exercise guidelines with specific guidelines to those with heart or lung disease. Written material provided at class time.   Education: Flexibility, Balance, Mind/Body Relaxation: - Group verbal and visual presentation with interactive activity on the components of exercise prescription. Introduces F.I.T.T principle from ACSM for exercise prescriptions. Reviews F.I.T.T. principles of flexibility and balance exercise training including progression. Also discusses the mind body connection.  Reviews various relaxation techniques to help reduce and manage stress (i.e. Deep breathing, progressive muscle relaxation, and visualization). Balance handout provided to take home. Written material provided at class time.   Activity Barriers & Risk Stratification:  Activity Barriers & Cardiac Risk Stratification - 07/20/23 1133       Activity Barriers & Cardiac Risk Stratification   Activity Barriers Arthritis;Joint Problems;Assistive Device;Deconditioning;Balance  Concerns;History of Falls;Neck/Spine Problems    Cardiac Risk Stratification High   <5 METs on         6 Minute Walk:  6 Minute Walk     Row Name 07/20/23 1342         6 Minute Walk   Phase Initial     Distance 1130 feet     Walk Time 6 minutes     # of Rest Breaks 0     MPH 2.14     METS 2.06     RPE 10.5     Perceived Dyspnea  0     VO2 Peak 7.22     Symptoms No     Resting HR 71 bpm     Resting BP 122/64     Resting Oxygen Saturation  100 %     Exercise Oxygen Saturation  during 6 min walk 99 %     Max Ex. HR 118 bpm     Max Ex. BP 130/66     2 Minute Post BP 114/68        Oxygen Initial Assessment:   Oxygen Re-Evaluation:   Oxygen Discharge (Final Oxygen Re-Evaluation):   Initial Exercise Prescription:  Initial Exercise Prescription - 07/20/23 1100       Date of Initial Exercise RX and Referring Provider   Date 07/20/23    Referring Provider Shelda Grizzle, MD    Expected Discharge Date 10/11/23      NuStep   Level 1    SPM 60    Minutes 15    METs 2      Track   Laps 12    Minutes 15    METs 2      Prescription Details   Frequency (times per week) 3    Duration Progress to 30 minutes of continuous aerobic without signs/symptoms of physical distress      Intensity   THRR 40-80% of Max Heartrate 56-112    Ratings of Perceived Exertion 11-13    Perceived Dyspnea 0-4      Progression   Progression Continue progressive overload as per policy without signs/symptoms or physical distress.      Resistance Training   Training Prescription Yes    Weight 2    Reps 10-15          Perform Capillary Blood Glucose checks as needed.  Exercise Prescription Changes:   Exercise Prescription Changes     Row Name 07/24/23 1040 08/07/23 1041 08/16/23 1029 09/06/23 1035 09/25/23 1037     Response to Exercise   Blood Pressure (Admit) 128/66 140/68 104/62 128/64 142/78   Blood Pressure (Exercise) 130/58 126/78 124/60 -- 126/80    Blood Pressure (Exit) 128/70 120/62 106/58 130/72 118/70   Heart Rate (Admit) 92 bpm 87 bpm 81 bpm 72 bpm 80 bpm   Heart Rate (Exercise) 131 bpm 126 bpm 135 bpm 131 bpm 124 bpm   Heart Rate (Exit) 99 bpm 96 bpm 78 bpm 81 bpm 89 bpm   Rating of Perceived Exertion (Exercise) 13 12 11 12 11    Symptoms None None None None None   Comments Off to a good start with exercise. -- -- -- Reviewed goals with Sharon Marsh.   Duration Continue with 30 min of aerobic exercise without signs/symptoms of physical distress. Continue with 30 min of aerobic exercise without signs/symptoms of physical distress. Continue with 30 min of aerobic exercise without signs/symptoms of physical distress. Continue with 30 min of aerobic exercise without signs/symptoms of physical distress. Continue with 30 min of aerobic exercise without signs/symptoms of physical distress.   Intensity THRR unchanged THRR unchanged THRR unchanged THRR unchanged THRR unchanged     Progression   Progression Continue to progress Marsh to maintain intensity without signs/symptoms of physical distress. Continue to progress Marsh to maintain intensity without signs/symptoms of physical distress. Continue to progress Marsh to maintain intensity without signs/symptoms of physical distress. Continue to progress Marsh to maintain intensity without signs/symptoms of physical distress. Continue to progress Marsh to maintain intensity without signs/symptoms of physical distress.   Average METs 2.7 2.7 3 2.8 2.5     Resistance Training   Training Prescription Yes Yes No No Yes   Weight 2 lbs 2 lbs Relaxation day, no weights. Relaxation day, no weights. 2 lbs   Reps 10-15 10-15 -- -- 10-15   Time 5 Minutes 5 Minutes -- -- 5 Minutes     Interval Training   Interval Training No No No No No     NuStep   Level 5 4 4 4 5    SPM 63 76 83 70 74   Minutes 15 15 15 15 15    METs 2.6 2.6 3.1 2.9  2.7     Track   Laps 15 14 15 13 16    Minutes 15 15 15  15 15    METs 2.91 2.79 2.91 2.66 3.04     Home Exercise Plan   Plans to continue exercise at -- -- Home (comment)  Seated, foot bike Home (comment)  Seated, foot bike Home (comment)  Seated, foot bike   Frequency -- -- Add 4 additional days to Marsh exercise sessions. Add 4 additional days to Marsh exercise sessions. Add 4 additional days to Marsh exercise sessions.   Initial Home Exercises Provided -- -- 08/16/23 08/16/23 08/16/23    Row Name 10/09/23 1025             Response to Exercise   Blood Pressure (Admit) 144/64       Blood Pressure (Exit) 132/62       Heart Rate (Admit) 71 bpm       Heart Rate (Exercise) 121 bpm       Heart Rate (Exit) 70 bpm       Rating of Perceived Exertion (Exercise) 11       Symptoms None       Comments Reviewed METs and goals with Sharon Marsh.       Duration Continue with 30 min of aerobic exercise without signs/symptoms of physical distress.       Intensity THRR unchanged         Progression   Progression Continue to progress Marsh to maintain intensity without signs/symptoms of physical distress.       Average METs 2.9         Resistance Training   Training Prescription Yes       Weight 2 lbs       Reps 10-15       Time 5 Minutes         Interval Training   Interval Training No         NuStep   Level 5       SPM 74       Minutes 15       METs 2.7         Track   Laps 16       Minutes 15       METs 3.04         Home Exercise Plan   Plans to continue exercise at Home (comment)  Seated, foot bike       Frequency Add 4 additional days to Marsh exercise sessions.       Initial Home Exercises Provided 08/16/23          Exercise Comments:   Exercise Comments     Row Name 07/24/23 1134 08/14/23 1151 08/16/23 1108 09/13/23 1105 09/25/23 1104   Exercise Comments Sharon Marsh tolerated exercise equipment and stretches without any concerns. Oriented her to the equipment and stretching routine. Reviewed METs with Sharon Marsh. Reviewed home  exercise guidelines and goals with Sharon Marsh. Reviewed goals and exercise plan with Sharon Marsh. Reviewed goals with Sharon Marsh.    Row Name 10/09/23 1104           Exercise Comments Reviewed METs and goals with Sharon Marsh.          Exercise Goals and Review:   Exercise Goals     Row Name 07/20/23 1133             Exercise Goals   Increase Physical Activity Yes       Intervention Provide advice, education, support and  counseling about physical activity/exercise needs.;Develop an individualized exercise prescription for aerobic and resistive training based on initial evaluation findings, risk stratification, comorbidities and participant's personal goals.       Expected Outcomes Short Term: Attend Marsh on a regular basis to increase amount of physical activity.;Long Term: Exercising regularly at least 3-5 days a week.;Long Term: Add in home exercise to make exercise part of routine and to increase amount of physical activity.       Increase Strength and Stamina Yes       Intervention Provide advice, education, support and counseling about physical activity/exercise needs.;Develop an individualized exercise prescription for aerobic and resistive training based on initial evaluation findings, risk stratification, comorbidities and participant's personal goals.       Expected Outcomes Short Term: Increase Marsh from initial exercise prescription for resistance, speed, and METs.;Short Term: Perform resistance training exercises routinely during Marsh and add in resistance training at home;Long Term: Improve cardiorespiratory fitness, muscular endurance and strength as measured by increased METs and functional capacity ( )       Marsh to understand and use rate of perceived exertion (RPE) scale Yes       Intervention Provide education and explanation on how to use RPE scale       Expected Outcomes Short Term: Marsh to use RPE daily in Marsh to express subjective intensity level;Long Term:  Marsh to use RPE to guide  intensity level when exercising independently       Knowledge and understanding of Target Heart Rate Range (THRR) Yes       Intervention Provide education and explanation of THRR including how the numbers were predicted and where they are located for reference       Expected Outcomes Short Term: Marsh to state/look up THRR;Long Term: Marsh to use THRR to govern intensity when exercising independently;Short Term: Marsh to use daily as guideline for intensity in Marsh       Understanding of Exercise Prescription Yes       Intervention Provide education, explanation, and written materials on patient's individual exercise prescription       Expected Outcomes Short Term: Marsh to explain Marsh exercise prescription;Long Term: Marsh to explain home exercise prescription to exercise independently          Exercise Goals Re-Evaluation :  Exercise Goals Re-Evaluation     Row Name 07/24/23 1134 08/16/23 1108 09/13/23 1105 09/25/23 1104 10/09/23 1104     Exercise Goal Re-Evaluation   Exercise Goals Review Increase Physical Activity;Increase Strength and Stamina;Marsh to understand and use rate of perceived exertion (RPE) scale Increase Physical Activity;Increase Strength and Stamina;Marsh to understand and use rate of perceived exertion (RPE) scale;Understanding of Exercise Prescription;Knowledge and understanding of Target Heart Rate Range (THRR) Increase Physical Activity;Increase Strength and Stamina;Marsh to understand and use rate of perceived exertion (RPE) scale;Understanding of Exercise Prescription;Knowledge and understanding of Target Heart Rate Range (THRR) Increase Physical Activity;Increase Strength and Stamina;Marsh to understand and use rate of perceived exertion (RPE) scale;Understanding of Exercise Prescription;Knowledge and understanding of Target Heart Rate Range (THRR) Increase Physical Activity;Increase Strength and Stamina;Marsh to understand and use rate of perceived exertion (RPE)  scale;Understanding of Exercise Prescription;Knowledge and understanding of Target Heart Rate Range (THRR)   Comments Sharon Marsh to understand and use RPE scale appropriately. Reviewed exercise prescription with Sharon Marsh. She doe ssome walking but is limited due to back/ hip pain. She has a foot bike that she uses daily. Sharon Marsh. She's walking  as tolerated and using her [employer] foot exerciser at least 15 minutes daily. She's also looking into joining a gym to help with consistency. Sharon Marsh continues to progress well in the Marsh. She plans to look into joining [employer] near her to continue her exercise upon completion of the Marsh. Sharon Marsh is scheduled to complete the cardiac Marsh Marsh next week. We discussed how she can maintain consistency with her exercise including setting an appointment time for her exercise and including enjoyable aerobic activity like line dancing. She has resumed lind dancing 1 day per week. She was previously dancing 2 days/week and plans to return to this eventually. She is also walking and doing the stretching exercises from cardiac Marsh.   Expected Outcomes Progress Marsh as tolerated to help improve cardiorespiratory fitness. Continue daily exercise to help increase strength and stamina and improve balance. Be Marsh to walk more without pain. Sharon Marsh will look into joining a gym in addition to exercise at home. Sharon Marsh will look into joining a gym in addition to exercise at home. Sharon Marsh will incorporate line dancing, stretching, and walking into her home exercise plan.      Discharge Exercise Prescription (Final Exercise Prescription Changes):  Exercise Prescription Changes - 10/09/23 1025       Response to Exercise   Blood Pressure (Admit) 144/64    Blood Pressure (Exit) 132/62    Heart Rate (Admit) 71 bpm    Heart Rate (Exercise) 121 bpm    Heart Rate (Exit) 70 bpm    Rating of Perceived Exertion (Exercise) 11    Symptoms None    Comments  Reviewed METs and goals with Sharon Marsh.    Duration Continue with 30 min of aerobic exercise without signs/symptoms of physical distress.    Intensity THRR unchanged      Progression   Progression Continue to progress Marsh to maintain intensity without signs/symptoms of physical distress.    Average METs 2.9      Resistance Training   Training Prescription Yes    Weight 2 lbs    Reps 10-15    Time 5 Minutes      Interval Training   Interval Training No      NuStep   Level 5    SPM 74    Minutes 15    METs 2.7      Track   Laps 16    Minutes 15    METs 3.04      Home Exercise Plan   Plans to continue exercise at Home (comment)   Seated, foot bike   Frequency Add 4 additional days to Marsh exercise sessions.    Initial Home Exercises Provided 08/16/23          Nutrition:  Target Goals: Understanding of nutrition guidelines, daily intake of sodium 1500mg , cholesterol 200mg , calories 30% from fat and 7% or less from saturated fats, daily to have 5 or more servings of fruits and vegetables.  Education: Nutrition 1 -Group instruction provided by verbal, written material, interactive activities, discussions, models, and posters to present general guidelines for heart healthy nutrition including macronutrients, label reading, and promoting whole foods over processed counterparts. Education serves as Pensions consultant of discussion of heart healthy eating for all. Written material provided at class time.    Education: Nutrition 2 -Group instruction provided by verbal, written material, interactive activities, discussions, models, and posters to present general guidelines for heart healthy nutrition including sodium, cholesterol, and saturated fat. Providing guidance of habit forming to improve blood  pressure, cholesterol, and body weight. Written material provided at class time.     Biometrics:  Pre Biometrics - 07/20/23 1132       Pre Biometrics   Waist Circumference 35.5  inches    Hip Circumference 43 inches    Waist to Hip Ratio 0.83 %    Triceps Skinfold 26 mm    % Body Fat 41.1 %    Grip Strength 20 kg    Flexibility --   back pain- not done   Single Leg Stand 3.62 seconds           Nutrition Therapy Plan and Nutrition Goals:  Nutrition Therapy & Goals - 08/28/23 1116       Nutrition Therapy   Diet Heart Healthy diet      Personal Nutrition Goals   Nutrition Goal Patient to identify strategies for reducing cardiovascular risk by attending the [employer] education and nutrition series weekly.    Personal Goal #2 Patient to improve diet quality by using the plate method as a guide for meal planning to include lean protein/plant protein, fruits, vegetables, whole grains, nonfat dairy as part of a well-balanced diet.    Comments Goals in action. Sharon Marsh has medical history of  Marsh, Sharon Marsh, Sharon Marsh, Sharon Marsh, Sharon Marsh, Sharon Marsh. LDL is not at goal of <55; was started on repatha  on 6/17 due to statin intolerance. She continues to attend the [employer] education/nutrition series regularly. She has maintained her weight since starting with our Marsh. Patient will benefit from participation in intensive cardiac Marsh for nutrition education, exercise, and lifestyle modification.      Intervention Plan   Intervention Prescribe, educate and counsel regarding individualized specific dietary modifications aiming towards targeted core components such as weight, hypertension, lipid management, diabetes, heart failure and other comorbidities.;Nutrition handout(s) given to patient.    Expected Outcomes Short Term Goal: Understand basic principles of dietary content, such as calories, fat, sodium, cholesterol and nutrients.;Long Term Goal: Adherence to prescribed nutrition plan.          Nutrition Assessments:  Nutrition Assessments - 07/24/23 1127       Rate Your Plate Scores   Pre Score 63         MEDIFICTS Score Key: >=70 Need to make dietary  changes  40-70 Heart Healthy Diet <= 40 Therapeutic Level Cholesterol Diet  Flowsheet Row INTENSIVE CARDIAC Marsh from 07/24/2023 in [facility]  Picture Your Plate Total Score on Admission 63   Picture Your Plate Scores: <59 Unhealthy dietary pattern with much room for improvement. 41-50 Dietary pattern unlikely to meet recommendations for good health and room for improvement. 51-60 More healthful dietary pattern, with some room for improvement.  >60 Healthy dietary pattern, although there may be some specific behaviors that could be improved.    Nutrition Goals Re-Evaluation:  Nutrition Goals Re-Evaluation     Row Name 07/24/23 1133 08/28/23 1116           Goals   Current Weight 156 lb 4.9 oz (70.9 kg) 156 lb 4.9 oz (70.9 kg)      Comment Lpa 230, cholesterol 222, triglycerides 186, LDL 137, Cr 1.03, GFR 55, LPa 230, LDL 71, HDL 46      Expected Outcome Sharon Marsh, Sharon Marsh, Sharon Marsh, Sharon Marsh, Sharon Marsh, Sharon Marsh. LDL is not at goal of <55; was started on repatha  on 6/17. Patient will benefit from participation in intensive cardiac Marsh for  nutrition education, exercise, and lifestyle modification. Goals in action. Sharon Marsh has medical history of Marsh, Sharon Marsh, Sharon Marsh, Sharon Marsh, Sharon Marsh, Sharon Marsh. LDL is not at goal of <55; was started on repatha  on 6/17 due to statin intolerance. She continues to attend the [employer] education/nutrition series regularly. She has maintained her weight since starting with our Marsh. Patient will benefit from participation in intensive cardiac Marsh for nutrition education, exercise, and lifestyle modification.         Nutrition Goals Discharge (Final Nutrition Goals Re-Evaluation):  Nutrition Goals Re-Evaluation - 08/28/23 1116       Goals   Current Weight 156 lb 4.9 oz (70.9 kg)    Comment LPa 230, LDL 71, HDL 46    Expected Outcome Goals in action. Sharon Marsh has  medical history of Marsh, Sharon Marsh, Sharon Marsh, Sharon Marsh, Sharon Marsh, Sharon Marsh. LDL is not at goal of <55; was started on repatha  on 6/17 due to statin intolerance. She continues to attend the [employer] education/nutrition series regularly. She has maintained her weight since starting with our Marsh. Patient will benefit from participation in intensive cardiac Marsh for nutrition education, exercise, and lifestyle modification.          Psychosocial: Target Goals: Acknowledge presence or absence of significant depression and/or stress, maximize coping skills, provide positive support system. Participant is Marsh to verbalize types and ability to use techniques and skills needed for reducing stress and depression.   Education: Stress, Anxiety, and Depression - Group verbal and visual presentation to define topics covered.  Reviews how body is impacted by stress, anxiety, and depression.  Also discusses healthy ways to reduce stress and to treat/manage anxiety and depression. Written material provided at class time.   Education: Sleep Hygiene -Provides group verbal and written instruction about how sleep can affect your health.  Define sleep hygiene, discuss sleep cycles and impact of sleep habits. Review good sleep hygiene tips.   Initial Review & Psychosocial Screening:  Initial Psych Review & Screening - 09/11/23 1738       Initial Review   Current issues with None Identified      Family Dynamics   Good Support System? Yes   family and friends     Barriers   Psychosocial barriers to participate in Marsh There are no identifiable barriers or psychosocial needs.      Screening Interventions   Interventions Encouraged to exercise;Provide feedback about the scores to participant    Expected Outcomes Long Term goal: The participant improves quality of Life and PHQ9 Scores as seen by post scores and/or verbalization of changes;Short Term goal: Identification and review with participant  of any Quality of Life or Depression concerns found by scoring the questionnaire.          Quality of Life Scores:   Quality of Life - 07/20/23 1343       Quality of Life   Select Quality of Life      Quality of Life Scores   Health/Function Pre 21.37 %    Socioeconomic Pre 23 %    Psych/Spiritual Pre 24.64 %    Family Pre 22.5 %    GLOBAL Pre 22.55 %         Scores of 19 and below usually indicate a poorer quality of life in these areas.  A difference of  2-3 points is a clinically meaningful difference.  A difference of 2-3 points in the total score of the Quality of Life Index has been associated with significant improvement in  overall quality of life, self-image, physical symptoms, and general health in studies assessing change in quality of life.  PHQ-9: Review Flowsheet       07/20/2023  Depression screen PHQ 2/9  Decreased Interest 0  Down, Depressed, Hopeless 0  PHQ - 2 Score 0  Altered sleeping 3  Tired, decreased energy 0  Change in appetite 0  Feeling bad or failure about yourself  0  Trouble concentrating 0  Moving slowly or fidgety/restless 0  Suicidal thoughts 0  PHQ-9 Score 3  Difficult doing work/chores Somewhat difficult   Interpretation of Total Score  Total Score Depression Severity:  1-4 = Minimal depression, 5-9 = Mild depression, 10-14 = Moderate depression, 15-19 = Moderately severe depression, 20-27 = Severe depression   Psychosocial Evaluation and Intervention:   Psychosocial Re-Evaluation:  Psychosocial Re-Evaluation     Row Name 07/25/23 9272 08/15/23 1503 10/10/23 0912         Psychosocial Re-Evaluation   Current issues with Current Sleep Concerns Current Sleep Concerns None Identified     Comments Will discuss sleep hygiene in the upcoming week Discussed sleep hygiene. Sharon Marsh says that she has difficulty sleeping at night despite taking sleeping pills. Sharon Marsh has not voiced any increased concerns or stressors during exercise at cardiac  Marsh     Interventions Encouraged to attend Cardiac Rehabilitation for the exercise Encouraged to attend Cardiac Rehabilitation for the exercise;Stress management education;Relaxation education Encouraged to attend Cardiac Rehabilitation for the exercise;Stress management education;Relaxation education     Continue Psychosocial Services  Follow up required by staff Follow up required by staff No Follow up required        Psychosocial Discharge (Final Psychosocial Re-Evaluation):  Psychosocial Re-Evaluation - 10/10/23 0912       Psychosocial Re-Evaluation   Current issues with None Identified    Comments Sharon Marsh has not voiced any increased concerns or stressors during exercise at cardiac Marsh    Interventions Encouraged to attend Cardiac Rehabilitation for the exercise;Stress management education;Relaxation education    Continue Psychosocial Services  No Follow up required          Vocational Rehabilitation: Provide vocational Marsh assistance to qualifying candidates.   Vocational Marsh Evaluation & Intervention:  Vocational Marsh - 07/20/23 1155       Initial Vocational Marsh Evaluation & Intervention   Assessment shows need for Vocational Rehabilitation No   retired         Education: Education Goals: Education classes will be provided on a variety of topics geared toward better understanding of heart health and risk factor modification. Participant will state understanding/return demonstration of topics presented as noted by education test scores.  Learning Barriers/Preferences:  Learning Barriers/Preferences - 07/20/23 1155       Learning Barriers/Preferences   Learning Barriers None    Learning Preferences Audio;Computer/Internet;Group Instruction;Individual Instruction;Skilled Demonstration;Verbal Instruction;Written Material;Video;Pictoral          General Cardiac Education Topics:  AED/CPR: - Group verbal and written instruction with the use of models to  demonstrate the basic use of the AED with the basic ABC's of resuscitation.   Test and Procedures: - Group verbal and visual presentation and models provide information about basic cardiac anatomy and function. Reviews the testing methods done to diagnose heart disease and the outcomes of the test results. Describes the treatment choices: Medical Management, Angioplasty, or Coronary Bypass Surgery for treating various heart conditions including Myocardial Infarction, Angina, Valve Disease, and Cardiac Arrhythmias. Written material provided at class time.  Medication Safety: - Group verbal and visual instruction to review commonly prescribed medications for heart and lung disease. Reviews the medication, class of the drug, and side effects. Includes the steps to properly store meds and maintain the prescription regimen. Written material provided at class time.   Intimacy: - Group verbal instruction through game format to discuss how heart and lung disease can affect sexual intimacy. Written material provided at class time.   Know Your Numbers and Heart Failure: - Group verbal and visual instruction to discuss disease risk factors for cardiac and pulmonary disease and treatment options.  Reviews associated critical values for Overweight/Obesity, Hypertension, Cholesterol, and Diabetes.  Discusses basics of heart failure: signs/symptoms and treatments.  Introduces Heart Failure Zone chart for action plan for heart failure. Written material provided at class time.   Infection Prevention: - Provides verbal and written material to individual with discussion of infection control including proper hand washing and proper equipment cleaning during exercise session.   Falls Prevention: - Provides verbal and written material to individual with discussion of falls prevention and safety.   Other: -Provides group and verbal instruction on various topics (see comments)   Knowledge Questionnaire  Score:  Knowledge Questionnaire Score - 07/20/23 1155       Knowledge Questionnaire Score   Pre Score 19/24          Core Components/Risk Factors/Patient Goals at Admission:  Personal Goals and Risk Factors at Admission - 07/20/23 1156       Core Components/Risk Factors/Patient Goals on Admission    Weight Management Yes    Intervention Weight Management: Develop a combined nutrition and exercise Marsh designed to reach desired caloric intake, while maintaining appropriate intake of nutrient and fiber, sodium and fats, and appropriate energy expenditure required for the weight goal.;Weight Management: Provide education and appropriate resources to help participant work on and attain dietary goals.    Expected Outcomes Short Term: Continue to assess and modify interventions until short term weight is achieved;Long Term: Adherence to nutrition and physical activity/exercise Marsh aimed toward attainment of established weight goal;Understanding recommendations for meals to include 15-35% energy as protein, 25-35% energy from fat, 35-60% energy from carbohydrates, less than 200mg  of dietary cholesterol, 20-35 gm of total fiber daily;Understanding of distribution of calorie intake throughout the day with the consumption of 4-5 meals/snacks    Hypertension Yes    Intervention Provide education on lifestyle modifcations including regular physical activity/exercise, weight management, moderate sodium restriction and increased consumption of fresh fruit, vegetables, and low fat dairy, alcohol moderation, and smoking cessation.;Monitor prescription use compliance.    Expected Outcomes Short Term: Continued assessment and intervention until BP is < 140/91mm HG in hypertensive participants. < 130/62mm HG in hypertensive participants with diabetes, heart failure or chronic kidney disease.;Long Term: Maintenance of blood pressure at goal levels.    Lipids Yes    Intervention Provide education and  support for participant on nutrition & aerobic/resistive exercise along with prescribed medications to achieve LDL 70mg , HDL >40mg .    Expected Outcomes Long Term: Cholesterol controlled with medications as prescribed, with individualized exercise RX and with personalized nutrition plan. Value goals: LDL < 70mg , HDL > 40 mg.;Short Term: Participant states understanding of desired cholesterol values and is compliant with medications prescribed. Participant is following exercise prescription and nutrition guidelines.    Stress Yes          Education:Diabetes - Individual verbal and written instruction to review signs/symptoms of diabetes, desired ranges of glucose level fasting,  after meals and with exercise. Acknowledge that pre and post exercise glucose checks will be done for 3 sessions at entry of Marsh.   Core Components/Risk Factors/Patient Goals Review:   Goals and Risk Factor Review     Row Name 07/25/23 0729 08/15/23 1506 09/11/23 1738 10/10/23 0913       Core Components/Risk Factors/Patient Goals Review   Personal Goals Review Weight Management/Obesity;Hypertension;Lipids Weight Management/Obesity;Hypertension;Lipids Weight Management/Obesity;Hypertension;Lipids Weight Management/Obesity;Hypertension;Lipids    Review Sharon Marsh started cardiac Marsh on 07/24/23. Sharon Marsh well with exercise. Vital signs were stable. Sharon Marsh is doing well with exercise at cardiac Marsh. . Vital signs have been stable. Sharon Marsh. . Vital signs remain stable. Sharon Marsh . Vital signs remain stable. Sharon Marsh/ Sharon Marsh will tenatively complete cardiac Marsh on 10/20/23.    Expected Outcomes Sharon Marsh wil continue to participate in cardiac Marsh for exercise, nutrition and lifestyle modifications Sharon Marsh wil continue to participate in cardiac Marsh for exercise, nutrition and lifestyle modifications Sharon Marsh wil continue to  participate in cardiac Marsh for exercise, nutrition and lifestyle modifications Sharon Marsh wil continue to participate in cardiac Marsh for exercise, nutrition and lifestyle modifications       Core Components/Risk Factors/Patient Goals at Discharge (Final Review):   Goals and Risk Factor Review - 10/10/23 0913       Core Components/Risk Factors/Patient Goals Review   Personal Goals Review Weight Management/Obesity;Hypertension;Lipids    Review Sharon Marsh . Vital signs remain stable. Sharon Marsh/ Sharon Marsh will tenatively complete cardiac Marsh on 10/20/23.    Expected Outcomes Sharon Marsh wil continue to participate in cardiac Marsh for exercise, nutrition and lifestyle modifications          ITP Comments:  ITP Comments     Row Name 07/20/23 1130 07/25/23 0726 08/15/23 1502 09/11/23 1737 10/10/23 0911   ITP Comments Dr. Wilbert Bihari medical director. Introduction to pritikin education/intensive cardiac Marsh. Initial orientation packet reviewed with patient. 30 Day ITP Review. Sharon Marsh started cardiac Marsh on 07/25/23. Sharon Marsh well with exercise. 30 Day ITP Review. Sharon Marsh  has good attendance and participation with exercise at cardiac Marsh 30 Day ITP Review. Sharon Marsh  continues to have  good attendance and participation with exercise at cardiac Marsh 30 Day ITP Review. Sharon Marsh  continues to have  good attendance and participation with exercise at cardiac Marsh. Sharon Marsh will tenatively complete cardiac Marsh on 10/20/23.      Comments: See ITP Comments

## 2023-10-11 ENCOUNTER — Encounter (HOSPITAL_COMMUNITY)
Admission: RE | Admit: 2023-10-11 | Discharge: 2023-10-11 | Disposition: A | Discharge status: Home / Self Care | Source: Ambulatory Visit | Attending: Cardiology | Admitting: Cardiology

## 2023-10-11 DIAGNOSIS — Z955 Presence of coronary angioplasty implant and graft: Secondary | ICD-10-CM

## 2023-10-11 DIAGNOSIS — M5136 Other intervertebral disc degeneration, lumbar region with discogenic back pain only: Secondary | ICD-10-CM | POA: Diagnosis not present

## 2023-10-11 DIAGNOSIS — I214 Non-ST elevation (NSTEMI) myocardial infarction: Secondary | ICD-10-CM | POA: Diagnosis not present

## 2023-10-11 DIAGNOSIS — M9903 Segmental and somatic dysfunction of lumbar region: Secondary | ICD-10-CM | POA: Diagnosis not present

## 2023-10-13 ENCOUNTER — Encounter (HOSPITAL_COMMUNITY)
Admission: RE | Admit: 2023-10-13 | Discharge: 2023-10-13 | Disposition: A | Discharge status: Home / Self Care | Source: Ambulatory Visit | Attending: Cardiology | Admitting: Cardiology

## 2023-10-13 DIAGNOSIS — Z955 Presence of coronary angioplasty implant and graft: Secondary | ICD-10-CM

## 2023-10-13 DIAGNOSIS — I214 Non-ST elevation (NSTEMI) myocardial infarction: Secondary | ICD-10-CM

## 2023-10-16 ENCOUNTER — Encounter (HOSPITAL_COMMUNITY)
Admission: RE | Admit: 2023-10-16 | Discharge: 2023-10-16 | Disposition: A | Source: Ambulatory Visit | Attending: Cardiology

## 2023-10-16 DIAGNOSIS — Z955 Presence of coronary angioplasty implant and graft: Secondary | ICD-10-CM

## 2023-10-16 DIAGNOSIS — I214 Non-ST elevation (NSTEMI) myocardial infarction: Secondary | ICD-10-CM | POA: Diagnosis not present

## 2023-10-18 ENCOUNTER — Encounter (HOSPITAL_COMMUNITY)
Admission: RE | Admit: 2023-10-18 | Discharge: 2023-10-18 | Disposition: A | Source: Ambulatory Visit | Attending: Cardiology

## 2023-10-18 DIAGNOSIS — I214 Non-ST elevation (NSTEMI) myocardial infarction: Secondary | ICD-10-CM

## 2023-10-18 DIAGNOSIS — J301 Allergic rhinitis due to pollen: Secondary | ICD-10-CM | POA: Diagnosis not present

## 2023-10-18 DIAGNOSIS — Z955 Presence of coronary angioplasty implant and graft: Secondary | ICD-10-CM

## 2023-10-18 DIAGNOSIS — R0683 Snoring: Secondary | ICD-10-CM | POA: Diagnosis not present

## 2023-10-18 DIAGNOSIS — Z87891 Personal history of nicotine dependence: Secondary | ICD-10-CM | POA: Diagnosis not present

## 2023-10-18 DIAGNOSIS — R5383 Other fatigue: Secondary | ICD-10-CM | POA: Diagnosis not present

## 2023-10-20 ENCOUNTER — Encounter (HOSPITAL_COMMUNITY)
Admission: RE | Admit: 2023-10-20 | Discharge: 2023-10-20 | Disposition: A | Discharge status: Home / Self Care | Source: Ambulatory Visit | Attending: Cardiology | Admitting: Cardiology

## 2023-10-20 VITALS — BP 140/70 | HR 73 | Ht 61.0 in | Wt 157.8 lb

## 2023-10-20 DIAGNOSIS — Z955 Presence of coronary angioplasty implant and graft: Secondary | ICD-10-CM

## 2023-10-20 DIAGNOSIS — I214 Non-ST elevation (NSTEMI) myocardial infarction: Secondary | ICD-10-CM

## 2023-10-20 NOTE — Progress Notes (Signed)
 Discharge Progress Report  Patient Details  Name: MONCHEL POLLITT MRN: [id number] Date of Birth: Oct 31, 1945 Referring Provider:   Flowsheet Row INTENSIVE CARDIAC REHAB ORIENT from 07/20/2023 in [facility]  Referring Provider Shelda Grizzle, MD     Number of Visits: 58  Reason for Discharge:  Patient reached a stable level of exercise. Patient independent in their exercise. Patient has met program and personal goals.  Smoking History:  Social History   Tobacco Use  Smoking Status Former  Smokeless Tobacco Never    Diagnosis:  05/16/23 NSTEMI (non-ST elevated myocardial infarction) (HCC)  06/06/23 DES LAD  ADL [facility]:   Initial Exercise Prescription:  Initial Exercise Prescription - 07/20/23 1100       Date of Initial Exercise RX and Referring Provider   Date 07/20/23    Referring Provider Shelda Grizzle, MD    Expected Discharge Date 10/11/23      NuStep   Level 1    SPM 60    Minutes 15    METs 2      Track   Laps 12    Minutes 15    METs 2      Prescription Details   Frequency (times per week) 3    Duration Progress to 30 minutes of continuous aerobic without signs/symptoms of physical distress      Intensity   THRR 40-80% of Max Heartrate 56-112    Ratings of Perceived Exertion 11-13    Perceived Dyspnea 0-4      Progression   Progression Continue progressive overload as per policy without signs/symptoms or physical distress.      Resistance Training   Training Prescription Yes    Weight 2    Reps 10-15          Discharge Exercise Prescription (Final Exercise Prescription Changes):  Exercise Prescription Changes - 10/20/23 1036       Response to Exercise   Blood Pressure (Admit) 140/70    Blood Pressure (Exit) 130/62    Heart Rate (Admit) 73 bpm    Heart Rate (Exercise) 137 bpm    Heart Rate (Exit) 82 bpm    Rating of Perceived Exertion (Exercise) 12    Symptoms  None    Comments Pat completed the cardiac rehab program today.    Duration Continue with 30 min of aerobic exercise without signs/symptoms of physical distress.    Intensity THRR unchanged      Progression   Progression Continue to progress workloads to maintain intensity without signs/symptoms of physical distress.    Average METs 3.3      Resistance Training   Training Prescription Yes    Weight 3lbs, right arm, 2 lbs left arm    Reps 10-15    Time 5 Minutes      Interval Training   Interval Training No      NuStep   Level 5    SPM 88    Minutes 15    METs 3.4      Track   Laps 17    Minutes 15    METs 3.17      Home Exercise Plan   Plans to continue exercise at Home (comment)   Seated, foot bike   Frequency Add 4 additional days to program exercise sessions.    Initial Home Exercises Provided 08/16/23          Functional Capacity:  6 Minute Walk  Row Name 07/20/23 1342 10/13/23 1036       6 Minute Walk   Phase Initial Discharge    Distance 1130 feet 1512 feet    Distance % Change -- 33.81 %    Distance Feet Change -- 382 ft    Walk Time 6 minutes 6 minutes    # of Rest Breaks 0 0    MPH 2.14 2.86    METS 2.06 2.89    RPE 10.5 12    Perceived Dyspnea  0 0    VO2 Peak 7.22 10.13    Symptoms No No    Resting HR 71 bpm 75 bpm    Resting BP 122/64 142/72    Resting Oxygen Saturation  100 % --    Exercise Oxygen Saturation  during 6 min walk 99 % --    Max Ex. HR 118 bpm 129 bpm    Max Ex. BP 130/66 138/58    2 Minute Post BP 114/68 138/58       Psychological, QOL, Others - Outcomes: PHQ 2/9:    10/13/2023    4:22 PM 07/20/2023   11:33 AM  Depression screen PHQ 2/9  Decreased Interest 0 0  Down, Depressed, Hopeless 0 0  PHQ - 2 Score 0 0  Altered sleeping 3 3  Tired, decreased energy 0 0  Change in appetite 0 0  Feeling bad or failure about yourself  1 0  Trouble concentrating 0 0  Moving slowly or fidgety/restless 0 0  Suicidal  thoughts 0 0  PHQ-9 Score 4 3  Difficult doing work/chores Somewhat difficult Somewhat difficult    Quality of Life:  Quality of Life - 10/13/23 1626       Quality of Life   Select Quality of Life      Quality of Life Scores   Health/Function Pre 21.37 %    Health/Function Post 24.81 %    Health/Function % Change 16.1 %    Socioeconomic Pre 23 %    Socioeconomic Post 18.29 %    Socioeconomic % Change  -20.48 %    Psych/Spiritual Pre 24.64 %    Psych/Spiritual Post 24 %    Psych/Spiritual % Change -2.6 %    Family Pre 22.5 %    Family Post 25.5 %    Family % Change 13.33 %    GLOBAL Pre 22.55 %    GLOBAL Post 23.24 %    GLOBAL % Change 3.06 %          Personal Goals: Goals established at orientation with interventions provided to work toward goal.  Personal Goals and Risk Factors at Admission - 07/20/23 1156       Core Components/Risk Factors/Patient Goals on Admission    Weight Management Yes    Intervention Weight Management: Develop a combined nutrition and exercise program designed to reach desired caloric intake, while maintaining appropriate intake of nutrient and fiber, sodium and fats, and appropriate energy expenditure required for the weight goal.;Weight Management: Provide education and appropriate resources to help participant work on and attain dietary goals.    Expected Outcomes Short Term: Continue to assess and modify interventions until short term weight is achieved;Long Term: Adherence to nutrition and physical activity/exercise program aimed toward attainment of established weight goal;Understanding recommendations for meals to include 15-35% energy as protein, 25-35% energy from fat, 35-60% energy from carbohydrates, less than 200mg  of dietary cholesterol, 20-35 gm of total fiber daily;Understanding of distribution of calorie intake throughout the  day with the consumption of 4-5 meals/snacks    Hypertension Yes    Intervention Provide education on  lifestyle modifcations including regular physical activity/exercise, weight management, moderate sodium restriction and increased consumption of fresh fruit, vegetables, and low fat dairy, alcohol moderation, and smoking cessation.;Monitor prescription use compliance.    Expected Outcomes Short Term: Continued assessment and intervention until BP is < 140/93mm HG in hypertensive participants. < 130/43mm HG in hypertensive participants with diabetes, heart failure or chronic kidney disease.;Long Term: Maintenance of blood pressure at goal levels.    Lipids Yes    Intervention Provide education and support for participant on nutrition & aerobic/resistive exercise along with prescribed medications to achieve LDL 70mg , HDL >40mg .    Expected Outcomes Long Term: Cholesterol controlled with medications as prescribed, with individualized exercise RX and with personalized nutrition plan. Value goals: LDL < 70mg , HDL > 40 mg.;Short Term: Participant states understanding of desired cholesterol values and is compliant with medications prescribed. Participant is following exercise prescription and nutrition guidelines.    Stress Yes           Personal Goals Discharge:  Goals and Risk Factor Review     Row Name 07/25/23 0729 08/15/23 1506 09/11/23 1738 10/10/23 0913       Core Components/Risk Factors/Patient Goals Review   Personal Goals Review Weight Management/Obesity;Hypertension;Lipids Weight Management/Obesity;Hypertension;Lipids Weight Management/Obesity;Hypertension;Lipids Weight Management/Obesity;Hypertension;Lipids    Review Bruna started cardiac rehab on 07/24/23. Pat did well with exercise. Vital signs were stable. Bruna is doing well with exercise at cardiac rehab. . Vital signs have been stable. Bruna continues to do well with exercise at cardiac rehab. . Vital signs remain stable. Bruna continues to do well with exercise at cardiac rehab . Vital signs remain stable. Bruna has increased her workloads/ Bruna  will tenatively complete cardiac rehab on 10/20/23.    Expected Outcomes Bruna wil continue to participate in cardiac rehab for exercise, nutrition and lifestyle modifications Bruna wil continue to participate in cardiac rehab for exercise, nutrition and lifestyle modifications Bruna wil continue to participate in cardiac rehab for exercise, nutrition and lifestyle modifications Bruna wil continue to participate in cardiac rehab for exercise, nutrition and lifestyle modifications       Exercise Goals and Review:  Exercise Goals     Row Name 07/20/23 1133             Exercise Goals   Increase Physical Activity Yes       Intervention Provide advice, education, support and counseling about physical activity/exercise needs.;Develop an individualized exercise prescription for aerobic and resistive training based on initial evaluation findings, risk stratification, comorbidities and participant's personal goals.       Expected Outcomes Short Term: Attend rehab on a regular basis to increase amount of physical activity.;Long Term: Exercising regularly at least 3-5 days a week.;Long Term: Add in home exercise to make exercise part of routine and to increase amount of physical activity.       Increase Strength and Stamina Yes       Intervention Provide advice, education, support and counseling about physical activity/exercise needs.;Develop an individualized exercise prescription for aerobic and resistive training based on initial evaluation findings, risk stratification, comorbidities and participant's personal goals.       Expected Outcomes Short Term: Increase workloads from initial exercise prescription for resistance, speed, and METs.;Short Term: Perform resistance training exercises routinely during rehab and add in resistance training at home;Long Term: Improve cardiorespiratory fitness, muscular endurance and strength as measured by  increased METs and functional capacity ( )       Able to understand  and use rate of perceived exertion (RPE) scale Yes       Intervention Provide education and explanation on how to use RPE scale       Expected Outcomes Short Term: Able to use RPE daily in rehab to express subjective intensity level;Long Term:  Able to use RPE to guide intensity level when exercising independently       Knowledge and understanding of Target Heart Rate Range (THRR) Yes       Intervention Provide education and explanation of THRR including how the numbers were predicted and where they are located for reference       Expected Outcomes Short Term: Able to state/look up THRR;Long Term: Able to use THRR to govern intensity when exercising independently;Short Term: Able to use daily as guideline for intensity in rehab       Understanding of Exercise Prescription Yes       Intervention Provide education, explanation, and written materials on patient's individual exercise prescription       Expected Outcomes Short Term: Able to explain program exercise prescription;Long Term: Able to explain home exercise prescription to exercise independently          Exercise Goals Re-Evaluation:  Exercise Goals Re-Evaluation     Row Name 07/24/23 1134 08/16/23 1108 09/13/23 1105 09/25/23 1104 10/09/23 1104     Exercise Goal Re-Evaluation   Exercise Goals Review Increase Physical Activity;Increase Strength and Stamina;Able to understand and use rate of perceived exertion (RPE) scale Increase Physical Activity;Increase Strength and Stamina;Able to understand and use rate of perceived exertion (RPE) scale;Understanding of Exercise Prescription;Knowledge and understanding of Target Heart Rate Range (THRR) Increase Physical Activity;Increase Strength and Stamina;Able to understand and use rate of perceived exertion (RPE) scale;Understanding of Exercise Prescription;Knowledge and understanding of Target Heart Rate Range (THRR) Increase Physical Activity;Increase Strength and Stamina;Able to understand and use  rate of perceived exertion (RPE) scale;Understanding of Exercise Prescription;Knowledge and understanding of Target Heart Rate Range (THRR) Increase Physical Activity;Increase Strength and Stamina;Able to understand and use rate of perceived exertion (RPE) scale;Understanding of Exercise Prescription;Knowledge and understanding of Target Heart Rate Range (THRR)   Comments Pat was able to understand and use RPE scale appropriately. Reviewed exercise prescription with Pat. She doe ssome walking but is limited due to back/ hip pain. She has a foot bike that she uses daily. Bruna feels she's progressed in the program. She's walking as tolerated and using her Ellipse foot exerciser at least 15 minutes daily. She's also looking into joining a gym to help with consistency. Bruna continues to progress well in the program. She plans to look into joining [employer] near her to continue her exercise upon completion of the program. Bruna is scheduled to complete the cardiac rehab program next week. We discussed how she can maintain consistency with her exercise including setting an appointment time for her exercise and including enjoyable aerobic activity like line dancing. She has resumed line dancing 1 day per week. She was previously dancing 2 days/week and plans to return to this eventually. She is also walking and doing the stretching exercises from cardiac rehab.   Expected Outcomes Progress workloads as tolerated to help improve cardiorespiratory fitness. Continue daily exercise to help increase strength and stamina and improve balance. Be able to walk more without pain. Bruna will look into joining a gym in addition to exercise at home. Bruna will look into joining  a gym in addition to exercise at home. Bruna will incorporate line dancing, stretching, and walking into her home exercise plan.    Row Name 10/20/23 1152             Exercise Goal Re-Evaluation   Exercise Goals Review Increase Physical Activity;Increase  Strength and Stamina;Able to understand and use rate of perceived exertion (RPE) scale;Understanding of Exercise Prescription;Knowledge and understanding of Target Heart Rate Range (THRR)       Comments Pat completed the cardiac rehab program and will continue exercise at home walking and using her seated Ellipses machine. She may join a gym in the future.       Expected Outcomes Bruna will exercise at home or at the gym to maintain health and fitness gains.          Nutrition & Weight - Outcomes:  Pre Biometrics - 07/20/23 1132       Pre Biometrics   Waist Circumference 35.5 inches    Hip Circumference 43 inches    Waist to Hip Ratio 0.83 %    Triceps Skinfold 26 mm    % Body Fat 41.1 %    Grip Strength 20 kg    Flexibility --   back pain- not done   Single Leg Stand 3.62 seconds          Post Biometrics - 10/20/23 1037        Post  Biometrics   Height 5' 1 (1.549 m)    Waist Circumference 35.25 inches    Hip Circumference 43.25 inches    Waist to Hip Ratio 0.82 %    BMI (Calculated) 29.84    Triceps Skinfold 25 mm    % Body Fat 40.9 %    Grip Strength 16 kg    Flexibility --   Not performed, back issues   Single Leg Stand 7.81 seconds          Nutrition:  Nutrition Therapy & Goals - 08/28/23 1116       Nutrition Therapy   Diet Heart Healthy diet      Personal Nutrition Goals   Nutrition Goal Patient to identify strategies for reducing cardiovascular risk by attending the [employer] education and nutrition series weekly.    Personal Goal #2 Patient to improve diet quality by using the plate method as a guide for meal planning to include lean protein/plant protein, fruits, vegetables, whole grains, nonfat dairy as part of a well-balanced diet.    Comments Goals in action. Bruna has medical history of  CAD, NSTEMI, s/p DES-LAD, familial hyperlipidemia, HTN, OSA. LDL is not at goal of <55; was started on repatha  on 6/17 due to statin intolerance. She continues to attend  the [employer] education/nutrition series regularly. She has maintained her weight since starting with our program. Patient will benefit from participation in intensive cardiac rehab for nutrition education, exercise, and lifestyle modification.      Intervention Plan   Intervention Prescribe, educate and counsel regarding individualized specific dietary modifications aiming towards targeted core components such as weight, hypertension, lipid management, diabetes, heart failure and other comorbidities.;Nutrition handout(s) given to patient.    Expected Outcomes Short Term Goal: Understand basic principles of dietary content, such as calories, fat, sodium, cholesterol and nutrients.;Long Term Goal: Adherence to prescribed nutrition plan.          Nutrition Discharge:  Nutrition Assessments - 10/13/23 1627       Rate Your Plate Scores   Pre Score 63  Education Questionnaire Score:  Knowledge Questionnaire Score - 10/13/23 1623       Knowledge Questionnaire Score   Pre Score 19/24    Post Score 21/24          Goals reviewed with patient; copy given to patient.Pt graduates from  Intensive/Traditional cardiac rehab program on 10/20/23  with completion of  35 exercise and  33 education sessions. Pt maintained good attendance and progressed nicely during her participation in rehab as evidenced by increased MET level. Pat increased her distance on her post exercise walk test by 382 feet.   Medication list reconciled. Repeat  PHQ score- 4 .  Pt has made significant lifestyle changes and should be commended for her success.  Pat achieved their goals during cardiac rehab.   Pt plans to continue exercise at home walking and using her cubi!SABRA Pries feels stronger and balance is better. We are proud of Pat's progress!Hadassah Elpidio Quan RN BSN

## 2024-01-01 ENCOUNTER — Other Ambulatory Visit (HOSPITAL_BASED_OUTPATIENT_CLINIC_OR_DEPARTMENT_OTHER): Payer: Self-pay

## 2024-01-03 ENCOUNTER — Other Ambulatory Visit (HOSPITAL_BASED_OUTPATIENT_CLINIC_OR_DEPARTMENT_OTHER): Payer: Self-pay | Admitting: Family

## 2024-01-03 DIAGNOSIS — E78011 Heterozygous familial hypercholesterolemia (hefh): Secondary | ICD-10-CM

## 2024-01-03 DIAGNOSIS — I25118 Atherosclerotic heart disease of native coronary artery with other forms of angina pectoris: Secondary | ICD-10-CM

## 2024-03-27 ENCOUNTER — Ambulatory Visit (HOSPITAL_BASED_OUTPATIENT_CLINIC_OR_DEPARTMENT_OTHER): Admitting: Cardiology
